# Patient Record
Sex: Female | Born: 1980 | Hispanic: No | Marital: Married | Smoking: Never smoker
Health system: Southern US, Community
[De-identification: ages and names within clinical notes are randomized; demographics above are authoritative.]

## PROBLEM LIST (undated history)

## (undated) ENCOUNTER — Inpatient Hospital Stay (HOSPITAL_COMMUNITY): Payer: Self-pay

## (undated) DIAGNOSIS — L509 Urticaria, unspecified: Secondary | ICD-10-CM

## (undated) DIAGNOSIS — K219 Gastro-esophageal reflux disease without esophagitis: Secondary | ICD-10-CM

## (undated) DIAGNOSIS — R002 Palpitations: Secondary | ICD-10-CM

## (undated) DIAGNOSIS — L309 Dermatitis, unspecified: Secondary | ICD-10-CM

## (undated) DIAGNOSIS — M542 Cervicalgia: Secondary | ICD-10-CM

## (undated) DIAGNOSIS — T7840XA Allergy, unspecified, initial encounter: Secondary | ICD-10-CM

## (undated) HISTORY — DX: Urticaria, unspecified: L50.9

## (undated) HISTORY — DX: Cervicalgia: M54.2

## (undated) HISTORY — DX: Palpitations: R00.2

## (undated) HISTORY — DX: Allergy, unspecified, initial encounter: T78.40XA

## (undated) HISTORY — DX: Gastro-esophageal reflux disease without esophagitis: K21.9

## (undated) HISTORY — PX: NO PAST SURGERIES: SHX2092

## (undated) HISTORY — DX: Dermatitis, unspecified: L30.9

---

## 2009-07-21 ENCOUNTER — Ambulatory Visit: Payer: Self-pay | Admitting: Family Medicine

## 2009-10-07 ENCOUNTER — Emergency Department (HOSPITAL_COMMUNITY): Admission: EM | Admit: 2009-10-07 | Discharge: 2009-10-07 | Payer: Self-pay | Admitting: Emergency Medicine

## 2010-01-08 ENCOUNTER — Ambulatory Visit: Payer: Self-pay | Admitting: Family Medicine

## 2010-01-17 ENCOUNTER — Ambulatory Visit (HOSPITAL_COMMUNITY): Admission: RE | Admit: 2010-01-17 | Discharge: 2010-01-17 | Payer: Self-pay | Admitting: Family Medicine

## 2010-03-22 ENCOUNTER — Ambulatory Visit: Payer: Self-pay | Admitting: Obstetrics and Gynecology

## 2010-06-05 ENCOUNTER — Ambulatory Visit: Payer: Self-pay | Admitting: Family Medicine

## 2011-01-14 ENCOUNTER — Other Ambulatory Visit (HOSPITAL_COMMUNITY): Payer: Self-pay | Admitting: Family Medicine

## 2011-01-14 DIAGNOSIS — R519 Headache, unspecified: Secondary | ICD-10-CM

## 2011-01-16 ENCOUNTER — Other Ambulatory Visit (HOSPITAL_COMMUNITY): Payer: Self-pay | Admitting: Family Medicine

## 2011-01-16 ENCOUNTER — Ambulatory Visit (HOSPITAL_COMMUNITY)
Admission: RE | Admit: 2011-01-16 | Discharge: 2011-01-16 | Disposition: A | Discharge status: Home / Self Care | Payer: Self-pay | Source: Ambulatory Visit | Attending: Family Medicine | Admitting: Family Medicine

## 2011-01-16 DIAGNOSIS — R51 Headache: Secondary | ICD-10-CM | POA: Insufficient documentation

## 2011-01-16 DIAGNOSIS — R519 Headache, unspecified: Secondary | ICD-10-CM

## 2011-03-14 ENCOUNTER — Encounter: Payer: Self-pay | Admitting: Family Medicine

## 2011-04-08 ENCOUNTER — Emergency Department (HOSPITAL_COMMUNITY)
Admission: EM | Admit: 2011-04-08 | Discharge: 2011-04-09 | Disposition: A | Discharge status: Home / Self Care | Payer: Self-pay | Attending: Emergency Medicine | Admitting: Emergency Medicine

## 2011-04-08 DIAGNOSIS — S335XXA Sprain of ligaments of lumbar spine, initial encounter: Secondary | ICD-10-CM | POA: Insufficient documentation

## 2011-04-08 DIAGNOSIS — M549 Dorsalgia, unspecified: Secondary | ICD-10-CM | POA: Insufficient documentation

## 2011-04-08 DIAGNOSIS — M79609 Pain in unspecified limb: Secondary | ICD-10-CM | POA: Insufficient documentation

## 2011-04-08 DIAGNOSIS — M5430 Sciatica, unspecified side: Secondary | ICD-10-CM | POA: Insufficient documentation

## 2011-04-08 DIAGNOSIS — X58XXXA Exposure to other specified factors, initial encounter: Secondary | ICD-10-CM | POA: Insufficient documentation

## 2011-04-08 DIAGNOSIS — N390 Urinary tract infection, site not specified: Secondary | ICD-10-CM | POA: Insufficient documentation

## 2011-04-09 LAB — URINE MICROSCOPIC-ADD ON

## 2011-04-09 LAB — URINALYSIS, ROUTINE W REFLEX MICROSCOPIC: Specific Gravity, Urine: 1.007 (ref 1.005–1.030)

## 2012-01-28 ENCOUNTER — Telehealth: Payer: Self-pay | Admitting: Family Medicine

## 2012-01-28 NOTE — Telephone Encounter (Signed)
MEFLOQUINE IS WEEKLY DOSAGE.  AND NEEDS TO START 1 WEEK BEFORE & CONTINUE FOR 4 WEEKS AFTER RETURNS.  THEY ARE STAYING APPROX 3 MONTHS.  SO WOULD NEED 17 WEEKS SUPPLY FOR EACH PATIENT.

## 2012-02-03 ENCOUNTER — Institutional Professional Consult (permissible substitution): Payer: Self-pay | Admitting: Family Medicine

## 2012-02-04 ENCOUNTER — Telehealth: Payer: Self-pay | Admitting: Family Medicine

## 2012-02-04 MED ORDER — MEFLOQUINE HCL 250 MG PO TABS: 250.0000 mg | ORAL_TABLET | ORAL | Status: AC

## 2012-02-04 NOTE — Telephone Encounter (Signed)
Vernona Rieger called med into [facility]

## 2012-10-07 ENCOUNTER — Encounter (HOSPITAL_COMMUNITY): Payer: Self-pay | Admitting: *Deleted

## 2012-10-07 ENCOUNTER — Inpatient Hospital Stay (HOSPITAL_COMMUNITY)
Admission: AD | Admit: 2012-10-07 | Discharge: 2012-10-07 | Disposition: A | Discharge status: Home / Self Care | Payer: 59 | Source: Ambulatory Visit | Attending: Obstetrics and Gynecology | Admitting: Obstetrics and Gynecology

## 2012-10-07 DIAGNOSIS — R109 Unspecified abdominal pain: Secondary | ICD-10-CM | POA: Insufficient documentation

## 2012-10-07 DIAGNOSIS — Z30431 Encounter for routine checking of intrauterine contraceptive device: Secondary | ICD-10-CM | POA: Insufficient documentation

## 2012-10-07 DIAGNOSIS — N390 Urinary tract infection, site not specified: Secondary | ICD-10-CM | POA: Insufficient documentation

## 2012-10-07 LAB — POCT PREGNANCY, URINE: Preg Test, Ur: NEGATIVE

## 2012-10-07 LAB — URINALYSIS, ROUTINE W REFLEX MICROSCOPIC
Glucose, UA: NEGATIVE mg/dL
Ketones, ur: NEGATIVE mg/dL
Protein, ur: NEGATIVE mg/dL
pH: 6 (ref 5.0–8.0)

## 2012-10-07 LAB — URINE MICROSCOPIC-ADD ON

## 2012-10-07 MED ORDER — SULFAMETHOXAZOLE-TRIMETHOPRIM 800-160 MG PO TABS: 1.0000 | ORAL_TABLET | Freq: Two times a day (BID) | ORAL | Status: AC

## 2012-10-07 NOTE — MAU Note (Addendum)
Has IUD, has had for 5 yrs (placed 02/09  By MD in [place]))- is time to be removed.  Pain started about 2 wks ago.    Feels bloated

## 2012-10-07 NOTE — MAU Provider Note (Signed)
History     CSN: [id number]  Arrival date and time: 10/07/12 1314   First Provider Initiated Contact with Patient 10/07/12 1604      No chief complaint on file.  HPI Denise Arias 32 y.o.  Comes to MAU as she has been having lower abdominal pain x 2 weeks.  She feels bloated and thinks it is due to her IUD which has been in for 5 years and is now expired.  Wants her IUD removed due to the bloating.  OB History    Grav Para Term Preterm Abortions TAB SAB Ect Mult Living   4 4 4       4       Past Medical History  Diagnosis Date  . Allergy     RHINITIS  . GERD (gastroesophageal reflux disease)     Past Surgical History  Procedure Date  . Vaginal delivery     X 4    History reviewed. No pertinent family history.  History  Substance Use Topics  . Smoking status: Never Smoker   . Smokeless tobacco: Never Used  . Alcohol Use: No    Allergies:  Allergies  Allergen Reactions  . Banana   . Fish Oil     rash  . Strawberry     rash    Prescriptions prior to admission  Medication Sig Dispense Refill  . amoxicillin (AMOXIL) 500 MG capsule Take 500 mg by mouth 2 (two) times daily. Patient is close to finishing course 10-07-12,called pharmacy and they did not have it on file      . ibuprofen (ADVIL,MOTRIN) 200 MG tablet Take 200 mg by mouth every 6 (six) hours as needed. pain        Review of Systems  Constitutional: Negative for fever.  Gastrointestinal: Positive for abdominal pain. Negative for nausea and vomiting.       Lower abdominal bloating  Genitourinary: Negative for dysuria.   Physical Exam   Blood pressure 126/73, pulse 62, temperature 98 F (36.7 C), temperature source Oral, resp. rate 18, height 5' 4.5" (1.638 m), weight 80.74 kg (178 lb), last menstrual period 09/28/2012.  Physical Exam  Nursing note and vitals reviewed. Constitutional: She is oriented to person, place, and time. She appears well-developed and well-nourished.  HENT:  Head:  Normocephalic.  Eyes: EOM are normal.  Neck: Neck supple.  GI: Soft. There is tenderness. There is no rebound and no guarding.       Diffuse lower abdominal tenderness.  L>R  No visual difference seen in abdomen from left to right side.  Genitourinary:       Declines pelvic  Musculoskeletal: Normal range of motion.  Neurological: She is alert and oriented to person, place, and time.  Skin: Skin is warm and dry.  Psychiatric: She has a normal mood and affect.    MAU Course  Procedures  MDM Discussed at length with client that pain may not be related to IUD.  Advised to leave IUD in even if expired as she may still have some contraceptive benefit with an expired IUD over no IUD.  Advised to have UTI treated first and pain may resolve.  Additionally noted after client left that she has been taking Amoxicillin for a sore throat which may also be causing some lower abdominal pain as her pain is worse on the left side. 1622  Client standing at the door dressed.  Declines any vaginal exam.  Wants to go if IUD is not going to be  removed today.  Desires an appointment in the clinic.  Will treat UTI today.  Assessment and Plan  UTI Lower abdominal pain Expired IUD  Plan rx septra DS one PO bid x 3 days (#6) no refills Message sent to GYN clinic to schedule an appointment. Client left distressed over the amount of time it has taken today to be seen.  BURLESON,TERRI 10/07/2012, 4:17 PM

## 2012-10-08 ENCOUNTER — Encounter: Payer: Self-pay | Admitting: Medical

## 2012-10-08 LAB — URINE CULTURE: Colony Count: NO GROWTH

## 2012-10-08 NOTE — MAU Provider Note (Signed)
Attestation of Attending Supervision of Advanced Practitioner (CNM/NP): Evaluation and management procedures were performed by the Advanced Practitioner under my supervision and collaboration.  I have reviewed the Advanced Practitioner's note and chart, and I agree with the management and plan.  Zhoe Catania 10/08/2012 10:21 AM

## 2012-10-23 ENCOUNTER — Other Ambulatory Visit: Payer: Self-pay | Admitting: Medical

## 2012-10-23 ENCOUNTER — Encounter: Payer: Self-pay | Admitting: Medical

## 2012-10-23 ENCOUNTER — Ambulatory Visit (INDEPENDENT_AMBULATORY_CARE_PROVIDER_SITE_OTHER): Payer: Managed Care, Other (non HMO) | Admitting: Medical

## 2012-10-23 VITALS — BP 110/76 | HR 69 | Temp 97.8°F | Ht 64.0 in | Wt 178.0 lb

## 2012-10-23 DIAGNOSIS — Z975 Presence of (intrauterine) contraceptive device: Secondary | ICD-10-CM

## 2012-10-23 DIAGNOSIS — Z30433 Encounter for removal and reinsertion of intrauterine contraceptive device: Secondary | ICD-10-CM

## 2012-10-23 LAB — POCT PREGNANCY, URINE: Preg Test, Ur: NEGATIVE

## 2012-10-23 MED ORDER — LEVONORGESTREL 20 MCG/24HR IU IUD
INTRAUTERINE_SYSTEM | Freq: Once | INTRAUTERINE | Status: AC
  Administered 2012-10-23: 1 via INTRAUTERINE

## 2012-10-23 NOTE — Patient Instructions (Addendum)
Intrauterine Device Insertion Care After Refer to this sheet in the next few weeks. These instructions provide you with information on caring for yourself after your procedure. Your caregiver may also give you more specific instructions. Your treatment has been planned according to current medical practices, but problems sometimes occur. Call your caregiver if you have any problems or questions after your procedure. HOME CARE INSTRUCTIONS   Only take over-the-counter or prescription medicines for pain, discomfort, or fever as directed by your caregiver. Do not use aspirin. This may increase bleeding.  Check your IUD to make sure it is in place before you resume sexual activity. You should be able to feel the strings. If you cannot feel the strings, something may be wrong. The IUD may have fallen out of the uterus, or the uterus may have been punctured (perforated) during placement. Also, if the strings are getting longer, it may mean that the IUD is being forced out of the uterus. You no longer have full protection from pregnancy if any of these problems occur.  You may resume sexual intercourse if you are not having problems with the IUD. The IUD is considered immediately effective.  You may resume normal activities.  Keep all follow-up appointments to be sure your IUD has remained in place. After the first exam, yearly exams are advised, unless you cannot feel the strings of your IUD.  Continue to check that the IUD is still in place by feeling for the strings after every menstrual period. SEEK MEDICAL CARE IF:   You have bleeding that is heavier or lasts longer than a normal menstrual cycle.  You have a fever.  You have increasing cramps or abdominal pain not relieved with medicine.  You have abdominal pain that does not seem to be related to the same area of earlier cramping and pain.  You are lightheaded, unusually weak, or faint.  You have abnormal vaginal discharge or  smells.  You have pain during sexual intercourse.  You cannot feel the IUD strings, or the IUD string has gotten longer.  You feel the IUD at the opening of the cervix in the vagina.  You think you are pregnant, or you miss your menstrual period.  The IUD string is hurting your sex partner. Document Released: 04/17/2011 Document Revised: 11/11/2011 Document Reviewed: 04/17/2011 ExitCare Patient Information 2013 [employer].  

## 2012-10-23 NOTE — Progress Notes (Signed)
Subjective:     Patient ID: Denise Arias, female   DOB: May 12, 1981, 32 y.o.   MRN: [id number]  HPI Denise Arias is a 32 yo G4P4 female who presents to the clinic requesting IUD removal and insertion. She reports that her IUD was inserted in [place] 5 years ago, and expires this month. She reports that the IUD is not Mirena. She reports that she was seen in the MAU at the beginning of February for abdominal pain, which she thought was associated with her IUD. She stated that she has had mild abdominal cramping over the last year. She was found to have a UTI and was treated, and decided to leave the IUD in until she had an appointment here for insertion. She reports that she enjoyed having the IUD but that her periods continued to be very heavy. She denies any abnormal vaginal discharge.  She reports that she has had a Pap smear within the last two years by [facility] and that the Pap results were normal.   Review of Systems  Constitutional: Negative for fever.  Gastrointestinal: Negative for abdominal pain.  Genitourinary: Negative for dysuria and frequency.       Objective:   Physical Exam General appearance - alert, well appearing, and in no distress and oriented to person, place, and time Chest - clear to auscultation, no wheezes, rales or rhonchi, symmetric air entry Heart - normal rate, regular rhythm, normal S1, S2, no murmurs, rubs, clicks or gallops Abdomen - soft, nontender, nondistended, no masses or organomegaly Pelvic - normal external genitalia, vulva, vagina, cervix, uterus and adnexa  Extremities - peripheral pulses normal, no pedal edema, no clubbing or cyanosis  IUD Removal  Patient was in the dorsal lithotomy position, normal external genitalia was noted.  A speculum was placed in the patient's vagina, normal discharge was noted, no lesions. The multiparous cervix was visualized, no lesions, no abnormal discharge; The strings of the IUD were grasped  and pulled using ring forceps.  The IUD was successfully removed in its entirety. Patient tolerated the procedure well.    IUD Procedure Note Patient identified, informed consent performed.  Discussed risks of irregular bleeding, cramping, infection, malpositioning or misplacement of the IUD outside the uterus which may require further procedures. Time out was performed.  Urine pregnancy test negative.  Speculum placed in the vagina.  Cervix visualized.  Cleaned with Betadine x 2.  Grasped anteriorly with a single tooth tenaculum.  Uterus sounded to 7 cm.  Mirena IUD placed per manufacturer's recommendations.  Strings trimmed to 3 cm. Tenaculum was removed, good hemostasis noted.  Patient tolerated procedure well.   Patient was given post-procedure instructions.  Patient was also asked to check IUD strings periodically and follow up in 4 weeks for IUD check.     Assessment:     Denise Arias is a 32 yo female who present for IUD removal and insertion. Negative pregnancy test today.  IUD removed Mirena IUD inserted    Plan:     Follow-up for IUD string check in 4 weeks or sooner if necessary Discussed potential for increased vaginal bleeding and cramping today. Advised to take ibuprofen for discomfort today.  I have seen and evaluated this patient with the PA student. I have edited the above not to reflect my observations and exam/procedures. I agree with the assessment and plan as written above.

## 2012-11-19 ENCOUNTER — Ambulatory Visit (INDEPENDENT_AMBULATORY_CARE_PROVIDER_SITE_OTHER): Payer: 59 | Admitting: Obstetrics & Gynecology

## 2012-11-19 ENCOUNTER — Encounter: Payer: Self-pay | Admitting: Obstetrics & Gynecology

## 2012-11-19 VITALS — BP 122/74 | HR 73 | Temp 97.0°F | Ht 64.0 in | Wt 180.9 lb

## 2012-11-19 DIAGNOSIS — Z30431 Encounter for routine checking of intrauterine contraceptive device: Secondary | ICD-10-CM

## 2012-11-19 DIAGNOSIS — Z975 Presence of (intrauterine) contraceptive device: Secondary | ICD-10-CM

## 2012-11-19 NOTE — Progress Notes (Signed)
Patient wants to know if she can have vitamins because she has mirena  And has had some irregular bleeding.

## 2012-11-19 NOTE — Assessment & Plan Note (Signed)
IUD strings checked and IUD in place. Follow [place] as needed.

## 2012-11-19 NOTE — Patient Instructions (Addendum)
For the vitamins you can take: women's multivitamin or prenatal vitamins.  Levonorgestrel intrauterine device (IUD) What is this medicine? LEVONORGESTREL IUD (LEE voe nor jes trel) is a contraceptive (birth control) device. It is used to prevent pregnancy and to treat heavy bleeding that occurs during your period. It can be used for up to 5 years. This medicine may be used for other purposes; ask your health care provider or pharmacist if you have questions. What should I tell my health care provider before I take this medicine? They need to know if you have any of these conditions: -abnormal Pap smear -cancer of the breast, uterus, or cervix -diabetes -endometritis -genital or pelvic infection now or in the past -have more than one sexual partner or your partner has more than one partner -heart disease -history of an ectopic or tubal pregnancy -immune system problems -IUD in place -liver disease or tumor -problems with blood clots or take blood-thinners -use intravenous drugs -uterus of unusual shape -vaginal bleeding that has not been explained -an unusual or allergic reaction to levonorgestrel, other hormones, silicone, or polyethylene, medicines, foods, dyes, or preservatives -pregnant or trying to get pregnant -breast-feeding How should I use this medicine? This device is placed inside the uterus by a health care professional. Talk to your pediatrician regarding the use of this medicine in children. Special care may be needed. Overdosage: If you think you have taken too much of this medicine contact a poison control center or emergency room at once. NOTE: This medicine is only for you. Do not share this medicine with others. What if I miss a dose? This does not apply. What may interact with this medicine? Do not take this medicine with any of the following medications: -amprenavir -bosentan -fosamprenavir This medicine may also interact with the following  medications: -aprepitant -barbiturate medicines for inducing sleep or treating seizures -bexarotene -griseofulvin -medicines to treat seizures like carbamazepine, ethotoin, felbamate, oxcarbazepine, phenytoin, topiramate -modafinil -pioglitazone -rifabutin -rifampin -rifapentine -some medicines to treat HIV infection like atazanavir, indinavir, lopinavir, nelfinavir, tipranavir, ritonavir -St. John's wort -warfarin This list may not describe all possible interactions. Give your health care provider a list of all the medicines, herbs, non-prescription drugs, or dietary supplements you use. Also tell them if you smoke, drink alcohol, or use illegal drugs. Some items may interact with your medicine. What should I watch for while using this medicine? Visit your doctor or health care professional for regular check ups. See your doctor if you or your partner has sexual contact with others, becomes HIV positive, or gets a sexual transmitted disease. This product does not protect you against HIV infection (AIDS) or other sexually transmitted diseases. You can check the placement of the IUD yourself by reaching up to the top of your vagina with clean fingers to feel the threads. Do not pull on the threads. It is a good habit to check placement after each menstrual period. Call your doctor right away if you feel more of the IUD than just the threads or if you cannot feel the threads at all. The IUD may come out by itself. You may become pregnant if the device comes out. If you notice that the IUD has come out use a backup birth control method like condoms and call your health care provider. Using tampons will not change the position of the IUD and are okay to use during your period. What side effects may I notice from receiving this medicine? Side effects that you should report to  your doctor or health care professional as soon as possible: -allergic reactions like skin rash, itching or hives, swelling  of the face, lips, or tongue -fever, flu-like symptoms -genital sores -high blood pressure -no menstrual period for 6 weeks during use -pain, swelling, warmth in the leg -pelvic pain or tenderness -severe or sudden headache -signs of pregnancy -stomach cramping -sudden shortness of breath -trouble with balance, talking, or walking -unusual vaginal bleeding, discharge -yellowing of the eyes or skin Side effects that usually do not require medical attention (report to your doctor or health care professional if they continue or are bothersome): -acne -breast pain -change in sex drive or performance -changes in weight -cramping, dizziness, or faintness while the device is being inserted -headache -irregular menstrual bleeding within first 3 to 6 months of use -nausea This list may not describe all possible side effects. Call your doctor for medical advice about side effects. You may report side effects to FDA at 1-800-FDA-1088. Where should I keep my medicine? This does not apply. NOTE: This sheet is a summary. It may not cover all possible information. If you have questions about this medicine, talk to your doctor, pharmacist, or health care provider.  2012, Elsevier/Gold Standard. (09/09/2008 6:39:08 PM)

## 2012-11-19 NOTE — Progress Notes (Signed)
Patient ID: Leela Vanbrocklin    DOB: 06-10-1981, 32 y.o.   MRN: [id number] --- Subjective:  Yeraldi is a 32 y.o.female who presents for IUD string check. Reports some spotting, otherwise no complaints.   ROS: see HPI Past Medical History: reviewed and updated medications and allergies. Social History: Tobacco: none  Objective: Filed Vitals:   11/19/12 1332  BP: 122/74  Pulse: 73  Temp: 97 F (36.1 C)    Physical Examination:   General appearance - alert, well appearing, and in no distress Abdomen - soft, non tender, non distended Pelvic exam: normal external genitalia, vulva, vagina, cervix with visualized IUD strings at ~2cm from os.    Attestation of Attending Supervision of Resident: Evaluation and management procedures were performed by the [facility] Medicine Resident under my supervision.  I have seen and examined the patient, reviewed the resident's note and chart, and I agree with the management and plan.  Anibal Henderson, M.D. 11/19/2012 2:30 PM

## 2012-12-16 ENCOUNTER — Ambulatory Visit: Payer: 59 | Admitting: Obstetrics & Gynecology

## 2013-03-12 ENCOUNTER — Encounter (HOSPITAL_BASED_OUTPATIENT_CLINIC_OR_DEPARTMENT_OTHER): Payer: Self-pay | Admitting: *Deleted

## 2013-03-12 ENCOUNTER — Emergency Department (HOSPITAL_BASED_OUTPATIENT_CLINIC_OR_DEPARTMENT_OTHER)
Admission: EM | Admit: 2013-03-12 | Discharge: 2013-03-12 | Disposition: A | Discharge status: Home / Self Care | Payer: Worker's Compensation | Attending: Emergency Medicine | Admitting: Emergency Medicine

## 2013-03-12 ENCOUNTER — Emergency Department (HOSPITAL_BASED_OUTPATIENT_CLINIC_OR_DEPARTMENT_OTHER): Payer: Worker's Compensation

## 2013-03-12 DIAGNOSIS — Y9289 Other specified places as the place of occurrence of the external cause: Secondary | ICD-10-CM | POA: Insufficient documentation

## 2013-03-12 DIAGNOSIS — S61209A Unspecified open wound of unspecified finger without damage to nail, initial encounter: Secondary | ICD-10-CM | POA: Insufficient documentation

## 2013-03-12 DIAGNOSIS — Z8719 Personal history of other diseases of the digestive system: Secondary | ICD-10-CM | POA: Insufficient documentation

## 2013-03-12 DIAGNOSIS — Y9389 Activity, other specified: Secondary | ICD-10-CM | POA: Insufficient documentation

## 2013-03-12 DIAGNOSIS — W268XXA Contact with other sharp object(s), not elsewhere classified, initial encounter: Secondary | ICD-10-CM | POA: Insufficient documentation

## 2013-03-12 DIAGNOSIS — S61218A Laceration without foreign body of other finger without damage to nail, initial encounter: Secondary | ICD-10-CM

## 2013-03-12 MED ORDER — HYDROCODONE-ACETAMINOPHEN 5-325 MG PO TABS: 2.0000 | ORAL_TABLET | ORAL | Status: DC | PRN

## 2013-03-12 NOTE — ED Notes (Signed)
Pt was not able to obtain enough urine to do urine drug screen upon arrival.  Has been waiting to be able to complete test.  Will try again prior to leaving ED.  Pt will see hand surgeon in the morning for tendon repair.  Lac not closed at this time pending eval by surgeon. Bulky gauze dressing and splint applied.

## 2013-03-12 NOTE — ED Notes (Signed)
Used kling and coban to wrap rt index finger

## 2013-03-12 NOTE — ED Provider Notes (Signed)
History    CSN: [id number] Arrival date & time 03/12/13  0036  First MD Initiated Contact with Patient 03/12/13 0136     Chief Complaint  Patient presents with  . Extremity Laceration   (Consider location/radiation/quality/duration/timing/severity/associated sxs/prior Treatment) HPI Comments: Patient lacerated her finger on a sharp object this evening at work.  She is having severe pain and is unable to extend the finger.  There was "something white" sticking out of the laceration shortly after it occurred.  She is having severe pain.  The history is provided by the patient.   Past Medical History  Diagnosis Date  . Allergy     RHINITIS  . GERD (gastroesophageal reflux disease)    Past Surgical History  Procedure Laterality Date  . Vaginal delivery      X 4   Family History  Problem Relation Age of Onset  . Diabetes Mother    History  Substance Use Topics  . Smoking status: Never Smoker   . Smokeless tobacco: Never Used  . Alcohol Use: No   OB History   Grav Para Term Preterm Abortions TAB SAB Ect Mult Living   4 4 4       4      Review of Systems  All other systems reviewed and are negative.    Allergies  Banana; Fish oil; and Strawberry  Home Medications   Current Outpatient Rx  Name  Route  Sig  Dispense  Refill  . ibuprofen (ADVIL,MOTRIN) 200 MG tablet   Oral   Take 200 mg by mouth every 6 (six) hours as needed. pain         . levonorgestrel (MIRENA) 20 MCG/24HR IUD   Intrauterine   1 each by Intrauterine route once.          BP 106/75  Temp(Src) 98.7 F (37.1 C) (Oral)  Resp 16  Ht 5\' 4"  (1.626 m)  Wt 170 lb (77.111 kg)  BMI 29.17 kg/m2  SpO2 100% Physical Exam  Nursing note and vitals reviewed. Constitutional: She is oriented to person, place, and time. She appears well-developed and well-nourished. No distress.  HENT:  Head: Normocephalic and atraumatic.  Mouth/Throat: Oropharynx is clear and moist.  Neck: Normal range of  motion. Neck supple.  Musculoskeletal: Normal range of motion. She exhibits no edema.  There is a 1 cm laceration overlying the dorsal aspect of the DIP joint of the left index finger.  She is unable to extend the distal phalanx.    Neurological: She is alert and oriented to person, place, and time.  Skin: Skin is warm and dry. She is not diaphoretic.    ED Course  Procedures (including critical care time) Labs Reviewed - No data to display Dg Finger Index Left  03/12/2013   *RADIOLOGY REPORT*  Clinical Data: Laceration and pain to the distal left index finger.  LEFT INDEX FINGER 2+V  Comparison: None.  Findings: Soft tissue defect along the distal aspect of the left second finger consistent with history of laceration.  The distal interphalangeal joint is in flexion, suggesting possible ligamentous injury.  No acute fracture or subluxation is suggested. No focal bone lesion or bone destruction.  No radiopaque soft tissue foreign bodies.  IMPRESSION: Soft tissue laceration about the distal aspect of the left second finger.  Flexion deformity of the distal interphalangeal joint may suggest ligamentous injury.  No acute fractures.   Original Report Authenticated By: Burman Nieves, M.D.   No diagnosis found.  MDM  The exam and xrays are consistent with a laceration to the extensor tendon of the left index finger.  She will be irrigated and placed in a bulky dressing.  She will given the contact information for Dr. Amanda Pea who is on for hand surgery tonight.  She needs to see him tomorrow to arrange repair.    Geoffery Lyons, MD 03/12/13 [id number]

## 2013-03-12 NOTE — ED Notes (Signed)
Pt c/o laceration to left index finger while at work 1 hr ago

## 2013-05-27 ENCOUNTER — Encounter: Payer: Self-pay | Admitting: Internal Medicine

## 2013-05-27 ENCOUNTER — Ambulatory Visit (INDEPENDENT_AMBULATORY_CARE_PROVIDER_SITE_OTHER): Payer: BC Managed Care – PPO | Admitting: Internal Medicine

## 2013-05-27 VITALS — BP 100/60 | HR 61 | Temp 98.6°F | Ht 64.0 in | Wt 186.5 lb

## 2013-05-27 DIAGNOSIS — K219 Gastro-esophageal reflux disease without esophagitis: Secondary | ICD-10-CM | POA: Insufficient documentation

## 2013-05-27 DIAGNOSIS — Z23 Encounter for immunization: Secondary | ICD-10-CM

## 2013-05-27 MED ORDER — PANTOPRAZOLE SODIUM 40 MG PO TBEC: 40.0000 mg | DELAYED_RELEASE_TABLET | Freq: Every day | ORAL | Status: DC

## 2013-05-27 NOTE — Assessment & Plan Note (Signed)
Will start protonix 40 mg daily Handout provided for diet for GERD  RTC if pain worsens or symptoms persist for  > 2 weeks

## 2013-05-27 NOTE — Progress Notes (Signed)
Subjective:    Patient ID: Denise Arias, female    DOB: 28-Jun-1981, 32 y.o.   MRN: [id number]  HPI  Pt presents to the clinic today for upper abdominal pain. This started 3 days ago. She has this pain intermittently. It seems to be worse after eating certain foods. She has had some associated nausea but denies vomiting. She is having normal bowel movements. She denies dark or tarry stools. She is unsure if she has reflux. She has not tried anything OTC.  Review of Systems      Past Medical History  Diagnosis Date  . Allergy     RHINITIS  . GERD (gastroesophageal reflux disease)     Current Outpatient Prescriptions  Medication Sig Dispense Refill  . HYDROcodone-acetaminophen (NORCO) 5-325 MG per tablet Take 2 tablets by mouth every 4 (four) hours as needed for pain.  20 tablet  0  . ibuprofen (ADVIL,MOTRIN) 200 MG tablet Take 200 mg by mouth every 6 (six) hours as needed. pain      . levonorgestrel (MIRENA) 20 MCG/24HR IUD 1 each by Intrauterine route once.       No current facility-administered medications for this visit.    Allergies  Allergen Reactions  . Banana   . Fish Oil     rash  . Strawberry     rash    Family History  Problem Relation Age of Onset  . Diabetes Mother     History   Social History  . Marital Status: Married    Spouse Name: N/A    Number of Children: N/A  . Years of Education: N/A   Occupational History  . Not on file.   Social History Main Topics  . Smoking status: Never Smoker   . Smokeless tobacco: Never Used  . Alcohol Use: No  . Drug Use: No  . Sexual Activity: Yes    Birth Control/ Protection: IUD   Other Topics Concern  . Not on file   Social History Narrative  . No narrative on file     Constitutional: Denies fever, malaise, fatigue, headache or abrupt weight changes.  Respiratory: Denies difficulty breathing, shortness of breath, cough or sputum production.   Cardiovascular: Denies chest pain, chest  tightness, palpitations or swelling in the hands or feet.  Gastrointestinal: Pt reports abdominal pain. Denies  bloating, constipation, diarrhea or blood in the stool.  GU: Denies urgency, frequency, pain with urination, burning sensation, blood in urine, odor or discharge.    No other specific complaints in a complete review of systems (except as listed in HPI above).  Objective:   Physical Exam   BP 100/60  Pulse 61  Temp(Src) 98.6 F (37 C) (Oral)  Ht 5\' 4"  (1.626 m)  Wt 186 lb 8 oz (84.596 kg)  BMI 32 kg/m2  SpO2 98% Wt Readings from Last 3 Encounters:  05/27/13 186 lb 8 oz (84.596 kg)  03/12/13 170 lb (77.111 kg)  11/19/12 180 lb 14.4 oz (82.056 kg)    General: Appears her stated age, well developed, well nourished in NAD. Cardiovascular: Normal rate and rhythm. S1,S2 noted.  No murmur, rubs or gallops noted. No JVD or BLE edema. No carotid bruits noted. Pulmonary/Chest: Normal effort and positive vesicular breath sounds. No respiratory distress. No wheezes, rales or ronchi noted.  Abdomen: Soft and tender in the epigastric area. Normal bowel sounds, no bruits noted. No distention or masses noted. Liver, spleen and kidneys non palpable.      Assessment &  Plan:

## 2013-05-27 NOTE — Patient Instructions (Signed)
Gastroesophageal Reflux Disease, Adult  Gastroesophageal reflux disease (GERD) happens when acid from your stomach flows up into the esophagus. When acid comes in contact with the esophagus, the acid causes soreness (inflammation) in the esophagus. Over time, GERD may create small holes (ulcers) in the lining of the esophagus.  CAUSES   · Increased body weight. This puts pressure on the stomach, making acid rise from the stomach into the esophagus.  · Smoking. This increases acid production in the stomach.  · Drinking alcohol. This causes decreased pressure in the lower esophageal sphincter (valve or ring of muscle between the esophagus and stomach), allowing acid from the stomach into the esophagus.  · Late evening meals and a full stomach. This increases pressure and acid production in the stomach.  · A malformed lower esophageal sphincter.  Sometimes, no cause is found.  SYMPTOMS   · Burning pain in the lower part of the mid-chest behind the breastbone and in the mid-stomach area. This may occur twice a week or more often.  · Trouble swallowing.  · Sore throat.  · Dry cough.  · Asthma-like symptoms including chest tightness, shortness of breath, or wheezing.  DIAGNOSIS   Your caregiver may be able to diagnose GERD based on your symptoms. In some cases, X-rays and other tests may be done to check for complications or to check the condition of your stomach and esophagus.  TREATMENT   Your caregiver may recommend over-the-counter or prescription medicines to help decrease acid production. Ask your caregiver before starting or adding any new medicines.   HOME CARE INSTRUCTIONS   · Change the factors that you can control. Ask your caregiver for guidance concerning weight loss, quitting smoking, and alcohol consumption.  · Avoid foods and drinks that make your symptoms worse, such as:  · Caffeine or alcoholic drinks.  · Chocolate.  · Peppermint or mint flavorings.  · Garlic and onions.  · Spicy foods.  · Citrus fruits,  such as oranges, lemons, or limes.  · Tomato-based foods such as sauce, chili, salsa, and pizza.  · Fried and fatty foods.  · Avoid lying down for the 3 hours prior to your bedtime or prior to taking a nap.  · Eat small, frequent meals instead of large meals.  · Wear loose-fitting clothing. Do not wear anything tight around your waist that causes pressure on your stomach.  · Raise the head of your bed 6 to 8 inches with wood blocks to help you sleep. Extra pillows will not help.  · Only take over-the-counter or prescription medicines for pain, discomfort, or fever as directed by your caregiver.  · Do not take aspirin, ibuprofen, or other nonsteroidal anti-inflammatory drugs (NSAIDs).  SEEK IMMEDIATE MEDICAL CARE IF:   · You have pain in your arms, neck, jaw, teeth, or back.  · Your pain increases or changes in intensity or duration.  · You develop nausea, vomiting, or sweating (diaphoresis).  · You develop shortness of breath, or you faint.  · Your vomit is green, yellow, black, or looks like coffee grounds or blood.  · Your stool is red, bloody, or black.  These symptoms could be signs of other problems, such as heart disease, gastric bleeding, or esophageal bleeding.  MAKE SURE YOU:   · Understand these instructions.  · Will watch your condition.  · Will get help right away if you are not doing well or get worse.  Document Released: 05/29/2005 Document Revised: 11/11/2011 Document Reviewed: 03/08/2011  [employer]® Patient   Information ©2014 [employer].

## 2013-07-23 ENCOUNTER — Encounter: Payer: Self-pay | Admitting: Medical

## 2013-07-23 ENCOUNTER — Ambulatory Visit (INDEPENDENT_AMBULATORY_CARE_PROVIDER_SITE_OTHER): Payer: Medicaid Other | Admitting: Medical

## 2013-07-23 VITALS — BP 140/82 | HR 68 | Temp 98.2°F | Resp 16 | Wt 181.0 lb

## 2013-07-23 DIAGNOSIS — R5381 Other malaise: Secondary | ICD-10-CM

## 2013-07-23 DIAGNOSIS — R239 Unspecified skin changes: Secondary | ICD-10-CM

## 2013-07-23 DIAGNOSIS — R238 Other skin changes: Secondary | ICD-10-CM

## 2013-07-23 LAB — BASIC METABOLIC PANEL
Calcium: 9.1 mg/dL (ref 8.4–10.5)
Chloride: 104 mEq/L (ref 96–112)
Creat: 0.53 mg/dL (ref 0.50–1.10)
Glucose, Bld: 82 mg/dL (ref 70–99)

## 2013-07-23 LAB — T4, FREE: Free T4: 1.01 ng/dL (ref 0.80–1.80)

## 2013-07-23 NOTE — Progress Notes (Signed)
Subjective: Here for skin issues.  For 10 years now she has had darkening of skin that is different than her normal skin.   She saw allergist 2004 that did allergy testing and found no allergies.  She ended up seeing dermatology (possibly Dr. Margo Aye but she is unsure) that put her on a pill and 2 creams with no improvement.  They mentioned her using a shot that would cause her to be sterile but would help the skin.  She declined this.  She was told to see a "hormone" doctor.  She is here for a referral to Dr. Talmage Nap whom she heard about.  She does note some fatigue.  Objective: Gen: wd, wn, nad Skin: areas of subtle lighter skin color of hands left foot, other subtle patches throughout, but no obvious vitiligo or other skin anomalies Neck: supple, nontender, no mass, no thyromegaly, no lymphadenopathy Lungs: clear Heart: RRR, normal s1, s2, no murmur   Assessment: Encounter Diagnoses  Name Primary?  . Other malaise and fatigue Yes  . Skin change     Plan: Labs today, etiology unsure.   consider referral (endocrine, different derm)

## 2013-07-24 LAB — CBC
MCV: 89.6 fL (ref 78.0–100.0)
Platelets: 254 10*3/uL (ref 150–400)
RBC: 4.6 MIL/uL (ref 3.87–5.11)
WBC: 8.8 10*3/uL (ref 4.0–10.5)

## 2013-07-26 ENCOUNTER — Encounter: Payer: Self-pay | Admitting: Family Medicine

## 2013-07-29 ENCOUNTER — Emergency Department (HOSPITAL_COMMUNITY)
Admission: EM | Admit: 2013-07-29 | Discharge: 2013-07-29 | Disposition: A | Discharge status: Home / Self Care | Payer: BC Managed Care – PPO | Attending: Emergency Medicine | Admitting: Emergency Medicine

## 2013-07-29 ENCOUNTER — Encounter (HOSPITAL_COMMUNITY): Payer: Self-pay | Admitting: Emergency Medicine

## 2013-07-29 DIAGNOSIS — R42 Dizziness and giddiness: Secondary | ICD-10-CM | POA: Insufficient documentation

## 2013-07-29 DIAGNOSIS — R519 Headache, unspecified: Secondary | ICD-10-CM

## 2013-07-29 DIAGNOSIS — R5381 Other malaise: Secondary | ICD-10-CM | POA: Insufficient documentation

## 2013-07-29 DIAGNOSIS — Z79899 Other long term (current) drug therapy: Secondary | ICD-10-CM | POA: Insufficient documentation

## 2013-07-29 DIAGNOSIS — K219 Gastro-esophageal reflux disease without esophagitis: Secondary | ICD-10-CM | POA: Insufficient documentation

## 2013-07-29 DIAGNOSIS — Z3202 Encounter for pregnancy test, result negative: Secondary | ICD-10-CM | POA: Insufficient documentation

## 2013-07-29 DIAGNOSIS — R509 Fever, unspecified: Secondary | ICD-10-CM | POA: Insufficient documentation

## 2013-07-29 DIAGNOSIS — R51 Headache: Secondary | ICD-10-CM | POA: Insufficient documentation

## 2013-07-29 LAB — URINALYSIS, ROUTINE W REFLEX MICROSCOPIC
Bilirubin Urine: NEGATIVE
Glucose, UA: NEGATIVE mg/dL
Hgb urine dipstick: NEGATIVE
Ketones, ur: NEGATIVE mg/dL
Nitrite: NEGATIVE
Protein, ur: NEGATIVE mg/dL

## 2013-07-29 LAB — BASIC METABOLIC PANEL
CO2: 24 mEq/L (ref 19–32)
Calcium: 8.9 mg/dL (ref 8.4–10.5)
Creatinine, Ser: 0.47 mg/dL — ABNORMAL LOW (ref 0.50–1.10)
GFR calc Af Amer: >90 mL/min (ref >90)

## 2013-07-29 LAB — CBC WITH DIFFERENTIAL/PLATELET
Basophils Absolute: 0 10*3/uL (ref 0.0–0.1)
Basophils Relative: 0 % (ref 0–1)
Eosinophils Absolute: 0.1 10*3/uL (ref 0.0–0.7)
Eosinophils Relative: 2 % (ref 0–5)
HCT: 37.9 % (ref 36.0–46.0)
Lymphocytes Relative: 22 % (ref 12–46)
MCH: 30.1 pg (ref 26.0–34.0)
MCHC: 34.6 g/dL (ref 30.0–36.0)
MCV: 87.1 fL (ref 78.0–100.0)
Monocytes Absolute: 0.7 10*3/uL (ref 0.1–1.0)
Neutro Abs: 4.5 10*3/uL (ref 1.7–7.7)
RDW: 12.3 % (ref 11.5–15.5)

## 2013-07-29 MED ORDER — HYDROCODONE-ACETAMINOPHEN 5-325 MG PO TABS
1.0000 | ORAL_TABLET | Freq: Once | ORAL | Status: AC
  Administered 2013-07-29: 1 via ORAL
  Filled 2013-07-29: qty 1

## 2013-07-29 MED ORDER — MECLIZINE HCL 25 MG PO TABS: 25.0000 mg | ORAL_TABLET | Freq: Two times a day (BID) | ORAL | Status: DC | PRN

## 2013-07-29 MED ORDER — TRAMADOL HCL 50 MG PO TABS: 50.0000 mg | ORAL_TABLET | Freq: Four times a day (QID) | ORAL | Status: DC | PRN

## 2013-07-29 MED ORDER — MECLIZINE HCL 25 MG PO TABS
25.0000 mg | ORAL_TABLET | Freq: Once | ORAL | Status: AC
  Administered 2013-07-29: 25 mg via ORAL
  Filled 2013-07-29: qty 1

## 2013-07-29 MED ORDER — SODIUM CHLORIDE 0.9 % IV BOLUS (SEPSIS)
1000.0000 mL | Freq: Once | INTRAVENOUS | Status: AC
  Administered 2013-07-29: 1000 mL via INTRAVENOUS

## 2013-07-29 MED ORDER — ONDANSETRON HCL 4 MG/2ML IJ SOLN
4.0000 mg | Freq: Once | INTRAMUSCULAR | Status: AC
  Administered 2013-07-29: 4 mg via INTRAVENOUS
  Filled 2013-07-29: qty 2

## 2013-07-29 MED ORDER — DEXAMETHASONE SODIUM PHOSPHATE 10 MG/ML IJ SOLN
10.0000 mg | Freq: Once | INTRAMUSCULAR | Status: AC
  Administered 2013-07-29: 10 mg via INTRAVENOUS
  Filled 2013-07-29: qty 1

## 2013-07-29 NOTE — ED Notes (Signed)
PA at bedside.

## 2013-07-29 NOTE — ED Notes (Signed)
Pt reports intermittent HA since Monday. Reports dizziness today while cooking and concerned she could have a fever. AO x4. Neuro intact. Hx headaches

## 2013-07-29 NOTE — ED Provider Notes (Signed)
CSN: [id number]     Arrival date & time 07/29/13  1259 History   First MD Initiated Contact with Patient 07/29/13 1310     Chief Complaint  Patient presents with  . Headache  . Fever   (Consider location/radiation/quality/duration/timing/severity/associated sxs/prior Treatment) Patient is a 32 y.o. female presenting with headaches.  Headache Pain location:  L parietal Quality:  Sharp Radiates to:  Does not radiate Severity currently:  5/10 Severity at highest:  7/10 Onset quality:  Gradual Duration:  4 days Timing:  Constant Progression:  Waxing and waning Chronicity:  Recurrent Similar to prior headaches: yes (same headache pain but new symptoms of weakness and dizziness)   Context: not activity, not caffeine, not stress, not intercourse and not straining   Relieved by:  Nothing Worsened by:  Nothing tried Associated symptoms: dizziness and weakness     Denise Arias is a 32 y.o.female with a significant PMH of GERD and allergies presents to the ER with complaints of dizziness and headache since Monday. The headache is left parietal and sharp. She gets a headache the is similar in the past but she has never had weakness and dizziness with it. Pt feels as though her walking is abnormal but her husband has not noticed change in gait. She is concerned that she may have a fever because she has felt hot.   Past Medical History  Diagnosis Date  . Allergy     RHINITIS  . GERD (gastroesophageal reflux disease)    Past Surgical History  Procedure Laterality Date  . Vaginal delivery      X 4   Family History  Problem Relation Age of Onset  . Diabetes Mother    History  Substance Use Topics  . Smoking status: Never Smoker   . Smokeless tobacco: Never Used  . Alcohol Use: No   OB History   Grav Para Term Preterm Abortions TAB SAB Ect Mult Living   4 4 4       4      Review of Systems  Neurological: Positive for dizziness and headaches.    Allergies  Banana;  Fish oil; and Strawberry  Home Medications   Current Outpatient Rx  Name  Route  Sig  Dispense  Refill  . ibuprofen (ADVIL,MOTRIN) 200 MG tablet   Oral   Take 200 mg by mouth every 6 (six) hours as needed. pain         . levonorgestrel (MIRENA) 20 MCG/24HR IUD   Intrauterine   1 each by Intrauterine route once.         . pantoprazole (PROTONIX) 40 MG tablet   Oral   Take 1 tablet (40 mg total) by mouth daily.   30 tablet   3   . meclizine (ANTIVERT) 25 MG tablet   Oral   Take 1 tablet (25 mg total) by mouth 2 (two) times daily as needed for dizziness.   28 tablet   0   . traMADol (ULTRAM) 50 MG tablet   Oral   Take 1 tablet (50 mg total) by mouth every 6 (six) hours as needed for moderate pain.   15 tablet   0    BP 115/83  Pulse 50  Temp(Src) 97.7 F (36.5 C) (Oral)  SpO2 100%  LMP 06/30/2013 Physical Exam  Nursing note and vitals reviewed. Constitutional: She is oriented to person, place, and time. She appears well-developed and well-nourished. No distress.  HENT:  Head: Normocephalic and atraumatic.    Eyes:  Pupils are equal, round, and reactive to light.  Neck: Normal range of motion. Neck supple. No Brudzinski's sign and no Kernig's sign noted.    Cardiovascular: Normal rate and regular rhythm.   Pulmonary/Chest: Effort normal. No respiratory distress.  Abdominal: Soft. There is no tenderness. There is no rebound.  Musculoskeletal: Normal range of motion.  Neurological: She is alert and oriented to person, place, and time. She has normal strength. No cranial nerve deficit or sensory deficit.  Skin: Skin is warm and dry. She is not diaphoretic.  Psychiatric: She has a normal mood and affect.    ED Course  Procedures (including critical care time) Labs Review Labs Reviewed  BASIC METABOLIC PANEL - Abnormal; Notable for the following:    Glucose, Bld 101 (*)    Creatinine, Ser 0.47 (*)    All other components within normal limits  PREGNANCY,  URINE  URINALYSIS, ROUTINE W REFLEX MICROSCOPIC  CBC WITH DIFFERENTIAL   Imaging Review No results found.  EKG Interpretation   None       MDM   1. Vertigo   2. Headache    Patient had a Head CT in 2012 which was normal, per patient. She was given Antivert, Zofran and Decadron in the ED. She has complete resolution of her symptoms. I got her up and walked her in the room myself. She endorses feeling much better. Her headache is still a mild 2/10. Will give a Vicodin 1 tab before dc. Dr. Richardo Hanks is her PCP. Can go for a recheck on Monday. Plan agreeable to Dr. Rubin Payor. Pt looks well.  Rx: antivert and ultram  32 y.o.Denise Arias's evaluation in the Emergency Department is complete. It has been determined that no acute conditions requiring further emergency intervention are present at this time. The patient/guardian have been advised of the diagnosis and plan. We have discussed signs and symptoms that warrant return to the ED, such as changes or worsening in symptoms.  Vital signs are stable at discharge. Filed Vitals:   07/29/13 1415  BP: 115/83  Pulse: 50  Temp:     Patient/guardian has voiced understanding and agreed to follow-up with the PCP or specialist.     Dorthula Matas, PA-C 07/29/13 1436

## 2013-07-29 NOTE — ED Notes (Signed)
Pt reports that she felt "hot" yesterday, but didn't have a thermometer to check her temperature. Denies fever/chills today.

## 2013-07-29 NOTE — ED Provider Notes (Signed)
Medical screening examination/treatment/procedure(s) were performed by non-physician practitioner and as supervising physician I was immediately available for consultation/collaboration.  EKG Interpretation   None        Rieley Hausman R. Yamilett Anastos, MD 07/29/13 1557 

## 2013-07-30 ENCOUNTER — Ambulatory Visit: Payer: Self-pay | Admitting: Internal Medicine

## 2013-09-02 NOTE — L&D Delivery Note (Signed)
0123: Call from nurse stating that patient having constant rectal pressure.  SVE C/C/+2.  Patient instructed on pushing methods.  Patient worried about receiving episiotomy due to female circumcision.  Patient educated regarding spontaneous tearing vs episiotomy and verbalized understanding.  Patient encouraged and delivered as below.  Infant with reassuring FHR via spot checks, but unable to maintain consistent monitoring due to position.  Delivery Note At 2:00 AM, on Dec. 9, 2015, a viable female "Denise Arias" was delivered via Vaginal, Spontaneous Delivery (Presentation: Left Occiput Anterior with Compound Left Hand ).  Manual restitution required and shoulders delivered without issues.  After delivery of body, provider immediately performed bulb suction of mouth, due to light meconium, followed by tactile stimulation.  Infant with vigorous tone, but appeared pale.  Infant APGARs: 8, 9.  Mother requested infant be cleaned prior to being placed on abdomen.  Cord clamped, cut, and blood collected. Infant taken to warmer, by nurse, to be cleaned. Placenta delivered spontaneously and noted to be intact with 3VC upon inspection.  Vaginal inspection revealed what is considered a left side labial laceration as it was within the upper portion of the female circumcision area consistent with the labia majora.  Fundus firm, at the umbilicus, and bleeding small.  Mother hemodynamically stable and infant skin to skin prior to provider exit.  Mother unsure of birth control method and opts to breastfeed.  Infant weight at one hour of life: 7lbs 11.3oz  Anesthesia: Epidural  Episiotomy: None Lacerations: Labial Suture Repair: 3.0 vicryl Est. Blood Loss (mL):  200  Mom to postpartum.  Baby to Couplet care / Skin to Skin.  Kadynce Bonds LYNN MSN, CNM 08/10/2014, 2:53 AM

## 2013-09-27 ENCOUNTER — Encounter: Payer: Self-pay | Admitting: Obstetrics & Gynecology

## 2013-09-27 ENCOUNTER — Ambulatory Visit (INDEPENDENT_AMBULATORY_CARE_PROVIDER_SITE_OTHER): Payer: BC Managed Care – PPO | Admitting: Obstetrics & Gynecology

## 2013-09-27 VITALS — BP 121/85 | HR 68 | Temp 97.6°F | Ht 64.0 in | Wt 186.6 lb

## 2013-09-27 DIAGNOSIS — R1084 Generalized abdominal pain: Secondary | ICD-10-CM

## 2013-09-27 DIAGNOSIS — Z30432 Encounter for removal of intrauterine contraceptive device: Secondary | ICD-10-CM

## 2013-09-27 DIAGNOSIS — Z309 Encounter for contraceptive management, unspecified: Secondary | ICD-10-CM

## 2013-09-27 DIAGNOSIS — T839XXA Unspecified complication of genitourinary prosthetic device, implant and graft, initial encounter: Secondary | ICD-10-CM

## 2013-09-27 DIAGNOSIS — M545 Low back pain, unspecified: Secondary | ICD-10-CM

## 2013-09-27 MED ORDER — NORGESTIMATE-ETH ESTRADIOL 0.25-35 MG-MCG PO TABS: 1.0000 | ORAL_TABLET | Freq: Every day | ORAL | Status: DC

## 2013-09-27 NOTE — Progress Notes (Signed)
Patient ID: Denise Arias, female   DOB: 08/28/1981, 33 y.o.   MRN: [id number] Pt presented with complaints of back pain, dysmenorrhea and breast tenderness since her IUD was placed.  I offered her alternative NSAIDS for the dysmenorrhea but she declines trying to maintain the IUD.    The patient was in the dorsal lithotomy position, normal external genitalia was noted.  A speculum was placed in the patient's vagina, normal discharge was noted, no lesions. The multiparous cervix was visualized, no lesions, no abnormal discharge;  and the cervix was swabbed with Betadine using scopettes. The strings of the IUD were grasped and pulled using ring forceps.  The IUD was successfully removed in its entirety.  (The strings of the IUD were not visualized, so Kelly forceps were introduced into the endometrial cavity and the IUD was grasped and removed in its entirety).  Patient tolerated the procedure well.    Pt wants to use OCP's until she has a Nexplanon placed. Sprintec q day F/u for Medtronicexplanon  Corvette Orser L. Harraway-Smith, M.D., Evern CoreFACOG

## 2013-09-27 NOTE — Patient Instructions (Signed)
Oral Contraception Information Oral contraceptive pills (OCPs) are medicines taken to prevent pregnancy. OCPs work by preventing the ovaries from releasing eggs. The hormones in OCPs also cause the cervical mucus to thicken, preventing the sperm from entering the uterus. The hormones also cause the uterine lining to become thin, not allowing a fertilized egg to attach to the inside of the uterus. OCPs are highly effective when taken exactly as prescribed. However, OCPs do not prevent sexually transmitted diseases (STDs). Safe sex practices, such as using condoms along with the pill, can help prevent STDs.  Before taking the pill, you may have a physical exam and Pap test. Your health care provider may order blood tests. The health care provider will make sure you are a good candidate for oral contraception. Discuss with your health care provider the possible side effects of the OCP you may be prescribed. When starting an OCP, it can take 2 to 3 months for the body to adjust to the changes in hormone levels in your body.  TYPES OF ORAL CONTRACEPTION  The combination pill This pill contains estrogen and progestin (synthetic progesterone) hormones. The combination pill comes in 21-day, 28-day, or 91-day packs. Some types of combination pills are meant to be taken continuously (365-day pills). With 21-day packs, you do not take pills for 7 days after the last pill. With 28-day packs, the pill is taken every day. The last 7 pills are without hormones. Certain types of pills have more than 21 hormone-containing pills. With 91-day packs, the first 84 pills contain both hormones, and the last 7 pills contain no hormones or contain estrogen only.  The minipill This pill contains the progesterone hormone only. The pill is taken every day continuously. It is very important to take the pill at the same time each day. The minipill comes in packs of 28 pills. All 28 pills contain the hormone.  ADVANTAGES OF ORAL  CONTRACEPTIVE PILLS  Decreases premenstrual symptoms.   Treats menstrual period cramps.   Regulates the menstrual cycle.   Decreases a heavy menstrual flow.   May treatacne, depending on the type of pill.   Treats abnormal uterine bleeding.   Treats polycystic ovarian syndrome.   Treats endometriosis.   Can be used as emergency contraception.  THINGS THAT CAN MAKE ORAL CONTRACEPTIVE PILLS LESS EFFECTIVE OCPs can be less effective if:   You forget to take the pill at the same time every day.   You have a stomach or intestinal disease that lessens the absorption of the pill.   You take OCPs with other medicines that make OCPs less effective, such as antibiotics, certain HIV medicines, and some seizure medicines.   You take expired OCPs.   You forget to restart the pill on day 7, when using the packs of 21 pills.  RISKS ASSOCIATED WITH ORAL CONTRACEPTIVE PILLS  Oral contraceptive pills can sometimes cause side effects, such as:  Headache.  Nausea.  Breast tenderness.  Irregular bleeding or spotting. Combination pills are also associated with a small increased risk of:  Blood clots.  Heart attack.  Stroke. Document Released: 11/09/2002 Document Revised: 06/09/2013 Document Reviewed: 02/07/2013 [facility] Patient Information 2014 [place], [place]. Etonogestrel implant What is this medicine? ETONOGESTREL (et oh noe JES trel) is a contraceptive (birth control) device. It is used to prevent pregnancy. It can be used for up to 3 years. This medicine may be used for other purposes; ask your health care provider or pharmacist if you have questions. COMMON BRAND NAME(S):  Implanon, Nexplanon  What should I tell my health care provider before I take this medicine? They need to know if you have any of these conditions: -abnormal vaginal bleeding -blood vessel disease or blood clots -cancer of the breast, cervix, or liver -depression -diabetes -gallbladder  disease -headaches -heart disease or recent heart attack -high blood pressure -high cholesterol -kidney disease -liver disease -renal disease -seizures -tobacco smoker -an unusual or allergic reaction to etonogestrel, other hormones, anesthetics or antiseptics, medicines, foods, dyes, or preservatives -pregnant or trying to get pregnant -breast-feeding How should I use this medicine? This device is inserted just under the skin on the inner side of your upper arm by a health care professional. Talk to your pediatrician regarding the use of this medicine in children. Special care may be needed. Overdosage: If you think you've taken too much of this medicine contact a poison control center or emergency room at once. Overdosage: If you think you have taken too much of this medicine contact a poison control center or emergency room at once. NOTE: This medicine is only for you. Do not share this medicine with others. What if I miss a dose? This does not apply. What may interact with this medicine? Do not take this medicine with any of the following medications: -amprenavir -bosentan -fosamprenavir This medicine may also interact with the following medications: -barbiturate medicines for inducing sleep or treating seizures -certain medicines for fungal infections like ketoconazole and itraconazole -griseofulvin -medicines to treat seizures like carbamazepine, felbamate, oxcarbazepine, phenytoin, topiramate -modafinil -phenylbutazone -rifampin -some medicines to treat HIV infection like atazanavir, indinavir, lopinavir, nelfinavir, tipranavir, ritonavir -St. John's wort This list may not describe all possible interactions. Give your health care provider a list of all the medicines, herbs, non-prescription drugs, or dietary supplements you use. Also tell them if you smoke, drink alcohol, or use illegal drugs. Some items may interact with your medicine. What should I watch for while using  this medicine? This product does not protect you against HIV infection (AIDS) or other sexually transmitted diseases. You should be able to feel the implant by pressing your fingertips over the skin where it was inserted. Tell your doctor if you cannot feel the implant. What side effects may I notice from receiving this medicine? Side effects that you should report to your doctor or health care professional as soon as possible: -allergic reactions like skin rash, itching or hives, swelling of the face, lips, or tongue -breast lumps -changes in vision -confusion, trouble speaking or understanding -dark urine -depressed mood -general ill feeling or flu-like symptoms -light-colored stools -loss of appetite, nausea -right upper belly pain -severe headaches -severe pain, swelling, or tenderness in the abdomen -shortness of breath, chest pain, swelling in a leg -signs of pregnancy -sudden numbness or weakness of the face, arm or leg -trouble walking, dizziness, loss of balance or coordination -unusual vaginal bleeding, discharge -unusually weak or tired -yellowing of the eyes or skin Side effects that usually do not require medical attention (Report these to your doctor or health care professional if they continue or are bothersome.): -acne -breast pain -changes in weight -cough -fever or chills -headache -irregular menstrual bleeding -itching, burning, and vaginal discharge -pain or difficulty passing urine -sore throat This list may not describe all possible side effects. Call your doctor for medical advice about side effects. You may report side effects to FDA at 1-800-FDA-1088. Where should I keep my medicine? This drug is given in a hospital or clinic and will not  be stored at home. NOTE: This sheet is a summary. It may not cover all possible information. If you have questions about this medicine, talk to your doctor, pharmacist, or health care provider.  2014, [employer]  [employer]. (2012-02-24 15:37:45)

## 2013-09-27 NOTE — Progress Notes (Signed)
Here to get IUD removed, has been in about a year, has been having severe cramps, back pain, breast pain  and wants to get it removed and discuss other birth control.

## 2013-09-30 ENCOUNTER — Encounter: Payer: Self-pay | Admitting: *Deleted

## 2013-10-11 ENCOUNTER — Ambulatory Visit: Payer: Self-pay | Admitting: Obstetrics & Gynecology

## 2013-11-24 ENCOUNTER — Ambulatory Visit (INDEPENDENT_AMBULATORY_CARE_PROVIDER_SITE_OTHER): Payer: BC Managed Care – PPO | Admitting: Family Medicine

## 2013-11-24 ENCOUNTER — Encounter: Payer: Self-pay | Admitting: Family Medicine

## 2013-11-24 VITALS — BP 98/64 | HR 76 | Temp 98.2°F | Ht 65.0 in | Wt 184.0 lb

## 2013-11-24 DIAGNOSIS — Z3490 Encounter for supervision of normal pregnancy, unspecified, unspecified trimester: Secondary | ICD-10-CM

## 2013-11-24 DIAGNOSIS — Z3201 Encounter for pregnancy test, result positive: Secondary | ICD-10-CM

## 2013-11-24 DIAGNOSIS — M545 Low back pain, unspecified: Secondary | ICD-10-CM

## 2013-11-24 DIAGNOSIS — R112 Nausea with vomiting, unspecified: Secondary | ICD-10-CM

## 2013-11-24 LAB — POCT URINALYSIS DIPSTICK
BILIRUBIN UA: NEGATIVE
Glucose, UA: NEGATIVE
Ketones, UA: NEGATIVE
Leukocytes, UA: NEGATIVE
Nitrite, UA: NEGATIVE
Protein, UA: NEGATIVE
RBC UA: NEGATIVE
Spec Grav, UA: <=1.005
Urobilinogen, UA: NEGATIVE
pH, UA: 7

## 2013-11-24 LAB — POCT URINE PREGNANCY: Preg Test, Ur: POSITIVE

## 2013-11-24 MED ORDER — PRENATAL MULTIVITAMIN CH: 1.0000 | ORAL_TABLET | Freq: Every day | ORAL | Status: DC

## 2013-11-24 NOTE — Progress Notes (Signed)
Chief Complaint  Patient presents with  . Nausea    and vomiting in the morning x 5 days. Had some dizziness x 5 days also. LBP and breast pain as well.    She is having morning sickness/nausea in the morning for the last 5 days.  She is having some pain in her low back, and soreness in both breasts.  She is also having some sour taste. She is a few days late on her period.  She previoiusly had IUD for contraception.  The IUD caused pain; it was removed in January.  Birth control pill prescription was sent to the pharmacy, but apparently the patient was never told about it, and never started taking them.  She and her husband have not been using condoms or other contraception.    Dr. Pennie RushingHaygood is her usual OB-GYN  Past Medical History  Diagnosis Date  . Allergy     RHINITIS  . GERD (gastroesophageal reflux disease)    Past Surgical History  Procedure Laterality Date  . Vaginal delivery      X 4   History   Social History  . Marital Status: Married    Spouse Name: N/A    Number of Children: N/A  . Years of Education: N/A   Occupational History  . Not on file.   Social History Main Topics  . Smoking status: Never Smoker   . Smokeless tobacco: Never Used  . Alcohol Use: No  . Drug Use: No  . Sexual Activity: Yes   Other Topics Concern  . Not on file   Social History Narrative  . No narrative on file   Meds:  She isn't currently taking any medications.  Allergies  Allergen Reactions  . Banana   . Fish Oil     rash  . Strawberry     rash    ROS:  Denies fevers, chills, URI symptoms, chest pain, shortness of breath.  +nausea, reflux, breast pain, low back pain, lower abdominal pain.  Denies vaginal bleeding, discharge, dysuria or other complaints.  +dizziness earlier today, resolved.  PHYSICAL EXAM: BP 98/64  Pulse 76  Temp(Src) 98.2 F (36.8 C) (Oral)  Ht 5\' 5"  (1.651 m)  Wt 184 lb (83.462 kg)  BMI 30.62 kg/m2  LMP 10/24/2013 Well developed,pleasant female in  no distress Neck: no lymphadenopathy Heart: regular rate and rhythm Lungs: clear bilaterally Back: no CVA tenderness.  Area of discomfort is lower central lumbar spine. No muscle spasm Abdomen: soft, mild suprapubic tenderness  Urine dip normal Urine pregnancy +  ASSESSMENT/PLAN:  Pregnancy - counseled re: safe meds, s/sx miscarriage, ectopic, normal pregnancy symptoms.  start PNV.  f/u with OB - Plan: Prenatal Vit-Fe Fumarate-FA (PRENATAL MULTIVITAMIN) TABS tablet  LBP (low back pain) - Plan: POCT Urinalysis Dipstick  Nausea with vomiting - Plan: POCT urine pregnancy  G5P4, with EGA [redacted]W[redacted]D EDC 07/31/14  She and her husband are happy about the pregnancy  Heat and tylenol prn low back pain.  F/u with Dr. Lilian ComaHaygood's office
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
## 2013-12-31 ENCOUNTER — Encounter (HOSPITAL_COMMUNITY): Payer: Self-pay | Admitting: *Deleted

## 2013-12-31 ENCOUNTER — Inpatient Hospital Stay (HOSPITAL_COMMUNITY)
Admission: AD | Admit: 2013-12-31 | Discharge: 2013-12-31 | Disposition: A | Discharge status: Home / Self Care | Payer: Medicaid Other | Source: Ambulatory Visit | Attending: Obstetrics and Gynecology | Admitting: Obstetrics and Gynecology

## 2013-12-31 DIAGNOSIS — Z833 Family history of diabetes mellitus: Secondary | ICD-10-CM | POA: Diagnosis not present

## 2013-12-31 DIAGNOSIS — R12 Heartburn: Secondary | ICD-10-CM | POA: Diagnosis not present

## 2013-12-31 DIAGNOSIS — IMO0002 Reserved for concepts with insufficient information to code with codable children: Secondary | ICD-10-CM | POA: Diagnosis not present

## 2013-12-31 DIAGNOSIS — O210 Mild hyperemesis gravidarum: Secondary | ICD-10-CM | POA: Insufficient documentation

## 2013-12-31 DIAGNOSIS — Z3490 Encounter for supervision of normal pregnancy, unspecified, unspecified trimester: Secondary | ICD-10-CM

## 2013-12-31 DIAGNOSIS — O219 Vomiting of pregnancy, unspecified: Secondary | ICD-10-CM

## 2013-12-31 DIAGNOSIS — R42 Dizziness and giddiness: Secondary | ICD-10-CM | POA: Diagnosis present

## 2013-12-31 LAB — CBC
HEMATOCRIT: 34.5 % — AB (ref 36.0–46.0)
HEMOGLOBIN: 11.8 g/dL — AB (ref 12.0–15.0)
MCH: 29.9 pg (ref 26.0–34.0)
MCHC: 34.2 g/dL (ref 30.0–36.0)
MCV: 87.3 fL (ref 78.0–100.0)
Platelets: 190 10*3/uL (ref 150–400)
RBC: 3.95 MIL/uL (ref 3.87–5.11)
RDW: 12.4 % (ref 11.5–15.5)
WBC: 9.7 10*3/uL (ref 4.0–10.5)

## 2013-12-31 LAB — COMPREHENSIVE METABOLIC PANEL
ALK PHOS: 52 U/L (ref 39–117)
ALT: 32 U/L (ref 0–35)
AST: 20 U/L (ref 0–37)
Albumin: 3.1 g/dL — ABNORMAL LOW (ref 3.5–5.2)
BUN: 16 mg/dL (ref 6–23)
CALCIUM: 8.7 mg/dL (ref 8.4–10.5)
CO2: 22 mEq/L (ref 19–32)
Chloride: 103 mEq/L (ref 96–112)
Creatinine, Ser: 0.47 mg/dL — ABNORMAL LOW (ref 0.50–1.10)
GFR calc non Af Amer: >90 mL/min (ref >90)
GLUCOSE: 104 mg/dL — AB (ref 70–99)
POTASSIUM: 4 meq/L (ref 3.7–5.3)
Sodium: 139 mEq/L (ref 137–147)
Total Bilirubin: <0.2 mg/dL — ABNORMAL LOW (ref 0.3–1.2)
Total Protein: 6.5 g/dL (ref 6.0–8.3)

## 2013-12-31 LAB — URINALYSIS, ROUTINE W REFLEX MICROSCOPIC
Bilirubin Urine: NEGATIVE
GLUCOSE, UA: NEGATIVE mg/dL
Hgb urine dipstick: NEGATIVE
KETONES UR: NEGATIVE mg/dL
Nitrite: NEGATIVE
Protein, ur: NEGATIVE mg/dL
Specific Gravity, Urine: >1.03 — ABNORMAL HIGH (ref 1.005–1.030)
UROBILINOGEN UA: 0.2 mg/dL (ref 0.0–1.0)
pH: 6 (ref 5.0–8.0)

## 2013-12-31 LAB — URINE MICROSCOPIC-ADD ON

## 2013-12-31 MED ORDER — FAMOTIDINE IN NACL 20-0.9 MG/50ML-% IV SOLN
20.0000 mg | Freq: Once | INTRAVENOUS | Status: AC
  Administered 2013-12-31: 20 mg via INTRAVENOUS
  Filled 2013-12-31: qty 50

## 2013-12-31 MED ORDER — FAMOTIDINE 20 MG PO TABS: 20.0000 mg | ORAL_TABLET | Freq: Two times a day (BID) | ORAL | Status: DC

## 2013-12-31 MED ORDER — LACTATED RINGERS IV BOLUS (SEPSIS): 1000.0000 mL | Freq: Once | INTRAVENOUS | Status: DC

## 2013-12-31 MED ORDER — PROMETHAZINE HCL 12.5 MG PO TABS: 12.5000 mg | ORAL_TABLET | Freq: Four times a day (QID) | ORAL | Status: DC | PRN

## 2013-12-31 MED ORDER — PROMETHAZINE HCL 25 MG/ML IJ SOLN
25.0000 mg | Freq: Once | INTRAVENOUS | Status: DC
  Administered 2013-12-31: 25 mg via INTRAVENOUS
  Filled 2013-12-31: qty 1

## 2013-12-31 MED ORDER — PRENATAL MULTIVITAMIN CH: 1.0000 | ORAL_TABLET | Freq: Every day | ORAL | Status: DC

## 2013-12-31 NOTE — MAU Note (Signed)
Pt currently has a bottle of Zofran in purse.  Pt states she tried to take it one time and it made her abd hurt more.  She also stated she wasn't able to have a BM and had difficulty sleeping with it.  She desires to use a different med for the N/V.

## 2013-12-31 NOTE — MAU Provider Note (Signed)
History     CSN: [id number]  Arrival date and time: 12/31/13 [id number]   First Provider Initiated Contact with Patient 12/31/13 2056      Chief Complaint  Patient presents with  . Dizziness  . Emesis During Pregnancy   HPI Ms. Denise Arias is a 33 y.o. [id number] at 4453w5d who presents to MAU today with N/V. The patient states that she has had N/V for > 2 weeks. She states that the N/V has been worse over the last week. She denies headache, abdominal pain, vaginal bleeding or discharge. She has had heartburn and fatigue.   OB History   Grav Para Term Preterm Abortions TAB SAB Ect Mult Living   5 4 4       4       Past Medical History  Diagnosis Date  . Allergy     RHINITIS  . GERD (gastroesophageal reflux disease)     Past Surgical History  Procedure Laterality Date  . Vaginal delivery      X 4    Family History  Problem Relation Age of Onset  . Diabetes Mother     History  Substance Use Topics  . Smoking status: Never Smoker   . Smokeless tobacco: Never Used  . Alcohol Use: No    Allergies:  Allergies  Allergen Reactions  . Banana   . Fish Oil     rash  . Strawberry     rash    Prescriptions prior to admission  Medication Sig Dispense Refill  . ibuprofen (ADVIL,MOTRIN) 200 MG tablet Take 200 mg by mouth every 6 (six) hours as needed. pain      . levonorgestrel (MIRENA) 20 MCG/24HR IUD 1 each by Intrauterine route once.      . meclizine (ANTIVERT) 25 MG tablet Take 1 tablet (25 mg total) by mouth 2 (two) times daily as needed for dizziness.  28 tablet  0  . norgestimate-ethinyl estradiol (ORTHO-CYCLEN,SPRINTEC,PREVIFEM) 0.25-35 MG-MCG tablet Take 1 tablet by mouth daily.  1 Package  11  . Prenatal Vit-Fe Fumarate-FA (PRENATAL MULTIVITAMIN) TABS tablet Take 1 tablet by mouth daily.  30 tablet  11  . traMADol (ULTRAM) 50 MG tablet Take 1 tablet (50 mg total) by mouth every 6 (six) hours as needed for moderate pain.  15 tablet  0    Review of Systems   Constitutional: Positive for malaise/fatigue. Negative for fever.  Gastrointestinal: Positive for nausea and vomiting. Negative for abdominal pain, diarrhea and constipation.  Genitourinary:       Neg - vaginal bleeding, discharge  Neurological: Negative for dizziness, loss of consciousness and headaches.   Physical Exam   Blood pressure 110/69, pulse 65, temperature 98.3 F (36.8 C), resp. rate 18, height 5\' 4"  (1.626 m), weight 179 lb 12.8 oz (81.557 kg), last menstrual period 10/24/2013, unknown if currently breastfeeding.  Physical Exam  Constitutional: She is oriented to person, place, and time. She appears well-developed and well-nourished. No distress.  HENT:  Head: Normocephalic and atraumatic.  Cardiovascular: Normal rate, regular rhythm and normal heart sounds.   Respiratory: Effort normal and breath sounds normal. No respiratory distress.  GI: Soft. Bowel sounds are normal. She exhibits no distension and no mass. There is tenderness (epigastric tenderness to palpation). There is no rebound and no guarding.  Neurological: She is alert and oriented to person, place, and time.  Skin: Skin is warm and dry. No erythema.  Psychiatric: She has a normal mood and affect.   Results for  orders placed during the hospital encounter of 12/31/13 (from the past 24 hour(s))  URINALYSIS, ROUTINE W REFLEX MICROSCOPIC     Status: Abnormal   Collection Time    12/31/13  8:08 PM      Result Value Ref Range   Color, Urine YELLOW  YELLOW   APPearance CLEAR  CLEAR   Specific Gravity, Urine >1.030 (*) 1.005 - 1.030   pH 6.0  5.0 - 8.0   Glucose, UA NEGATIVE  NEGATIVE mg/dL   Hgb urine dipstick NEGATIVE  NEGATIVE   Bilirubin Urine NEGATIVE  NEGATIVE   Ketones, ur NEGATIVE  NEGATIVE mg/dL   Protein, ur NEGATIVE  NEGATIVE mg/dL   Urobilinogen, UA 0.2  0.0 - 1.0 mg/dL   Nitrite NEGATIVE  NEGATIVE   Leukocytes, UA TRACE (*) NEGATIVE  URINE MICROSCOPIC-ADD ON     Status: Abnormal   Collection  Time    12/31/13  8:08 PM      Result Value Ref Range   Squamous Epithelial / LPF FEW (*) RARE   WBC, UA 3-6  <3 WBC/hpf   Bacteria, UA FEW (*) RARE   Urine-Other MUCOUS PRESENT        MAU Course  Procedures None  MDM UA today shows signs of dehydration CBC, CMP today 1 liter IV LR 20 mg Pepcid IVPB Patient will try to find a ride home so she can have Phenergan today Assessment and Plan  A: Nausea and vomiting in pregnancy prior to [redacted] weeks gestation Heartburn  P: Discharge home Rx for Phenergan and Pepcid given/sent to patient's pharmacy Refill for Prenatal vitamins sent to patient's pharmacy Patient advised to increase PO hydration and advance diet as tolerated Patient advised to start prenatal care ASAP. Plans to go to [facility], but is waiting for Medicaid Patient may return to MAU as needed or if her condition were to change or worsen  Freddi StarrJulie N Ethier, PA-C  12/31/2013, 8:56 PM

## 2013-12-31 NOTE — Discharge Instructions (Signed)
Morning Sickness °Morning sickness is when you feel sick to your stomach (nauseous) during pregnancy. You may feel sick to your stomach and throw up (vomit). You may feel sick in the morning, but you can feel this way any time of day. Some women feel very sick to their stomach and cannot stop throwing up (hyperemesis gravidarum). °HOME CARE °· Only take medicines as told by your doctor. °· Take multivitamins as told by your doctor. Taking multivitamins before getting pregnant can stop or lessen the harshness of morning sickness. °· Eat dry toast or unsalted crackers before getting out of bed. °· Eat 5 to 6 small meals a day. °· Eat dry and bland foods like rice and baked potatoes. °· Do not drink liquids with meals. Drink between meals. °· Do not eat greasy, fatty, or spicy foods. °· Have someone cook for you if the smell of food causes you to feel sick or throw up. °· If you feel sick to your stomach after taking prenatal vitamins, take them at night or with a snack. °· Eat protein when you need a snack (nuts, yogurt, cheese). °· Eat unsweetened gelatins for dessert. °· Wear a bracelet used for sea sickness (acupressure wristband). °· Go to a doctor that puts thin needles into certain body points (acupuncture) to improve how you feel. °· Do not smoke. °· Use a humidifier to keep the air in your house free of odors. °· Get lots of fresh air. °GET HELP IF: °· You need medicine to feel better. °· You feel dizzy or lightheaded. °· You are losing weight. °GET HELP RIGHT AWAY IF:  °· You feel very sick to your stomach and cannot stop throwing up. °· You pass out (faint). °Document Released: 09/26/2004 Document Revised: 04/21/2013 Document Reviewed: 02/03/2013 °ExitCare® Patient Information ©2014 [employer]. ° °Hyperemesis Gravidarum Diet °Hyperemesis gravidarum is a severe form of morning sickness. It is characterized by frequent and severe vomiting. It happens during the first trimester of pregnancy. It may be caused  by the rapid hormone changes that happen during pregnancy. It is associated with a 5% weight loss of pre-pregnancy weight. The hyperemesis diet may be used to lessen symptoms of nausea and vomiting. °EATING GUIDELINES °· Eat 5 to 6 small meals daily instead of 3 large meals. °· Avoid foods with strong smells. °· Avoid drinking 30 minutes before and after meals. °· Avoid fried or high-fat foods, such as butter and cream sauces. °· Starchy foods are usually well-tolerated, such as cereal, toast, bread, potatoes, pasta, rice, and pretzels. °· Eat crackers before you get out of bed in the morning. °· Avoid spicy foods. °· Ginger may help with nausea. Add ¼ tsp ginger to hot tea or choose ginger tea. °· Continue to take your prenatal vitamins as directed by your caregiver. °SAMPLE MEAL PLAN °Breakfast  °· ½ cup oatmeal °· 1 slice toast °· 1 tsp heart-healthy margarine °· 1 tsp jelly °· 1 scrambled egg °Midmorning Snack  °· 1 cup low-fat yogurt °Lunch  °· Plain ham sandwich °· Carrot or celery sticks °· 1 small apple °· 3 graham crackers °Midafternoon Snack  °· Cheese and crackers °Dinner °· 4 oz pork tenderloin °· 1 small baked potato °· 1 tsp margarine °· ½ cup broccoli °· ½ cup grapes °Evening Snack °· 1 cup pudding °Document Released: 06/16/2007 Document Revised: 11/11/2011 Document Reviewed: 01/19/2013 °ExitCare® Patient Information ©2014 Exit[employer], LLC. ° °

## 2013-12-31 NOTE — MAU Note (Signed)
Can't keep anything down. Very tired and some dizziness.

## 2014-01-01 NOTE — MAU Provider Note (Signed)
Attestation of Attending Supervision of Advanced Practitioner (CNM/NP): Evaluation and management procedures were performed by the Advanced Practitioner under my supervision and collaboration.  I have reviewed the Advanced Practitioner's note and chart, and I agree with the management and plan.  Denise Arias 01/01/2014 7:48 AM

## 2014-01-16 ENCOUNTER — Other Ambulatory Visit: Payer: Self-pay | Admitting: Medical

## 2014-02-09 LAB — OB RESULTS CONSOLE GC/CHLAMYDIA
Chlamydia: NEGATIVE
Gonorrhea: NEGATIVE

## 2014-02-09 LAB — OB RESULTS CONSOLE ABO/RH: RH TYPE: POSITIVE

## 2014-02-09 LAB — OB RESULTS CONSOLE HEPATITIS B SURFACE ANTIGEN: HEP B S AG: NEGATIVE

## 2014-02-09 LAB — OB RESULTS CONSOLE HIV ANTIBODY (ROUTINE TESTING): HIV: NONREACTIVE

## 2014-02-09 LAB — OB RESULTS CONSOLE RUBELLA ANTIBODY, IGM: RUBELLA: IMMUNE

## 2014-02-09 LAB — OB RESULTS CONSOLE RPR: RPR: NONREACTIVE

## 2014-02-09 LAB — OB RESULTS CONSOLE ANTIBODY SCREEN: ANTIBODY SCREEN: NEGATIVE

## 2014-07-04 ENCOUNTER — Encounter (HOSPITAL_COMMUNITY): Payer: Self-pay | Admitting: *Deleted

## 2014-07-19 ENCOUNTER — Inpatient Hospital Stay (HOSPITAL_COMMUNITY)
Admission: AD | Admit: 2014-07-19 | Discharge: 2014-07-19 | Disposition: A | Discharge status: Home / Self Care | Payer: Medicaid Other | Source: Ambulatory Visit | Attending: Obstetrics and Gynecology | Admitting: Obstetrics and Gynecology

## 2014-07-19 ENCOUNTER — Encounter (HOSPITAL_COMMUNITY): Payer: Self-pay | Admitting: *Deleted

## 2014-07-19 DIAGNOSIS — O26893 Other specified pregnancy related conditions, third trimester: Secondary | ICD-10-CM | POA: Diagnosis present

## 2014-07-19 DIAGNOSIS — Z3A39 39 weeks gestation of pregnancy: Secondary | ICD-10-CM | POA: Insufficient documentation

## 2014-07-19 DIAGNOSIS — K219 Gastro-esophageal reflux disease without esophagitis: Secondary | ICD-10-CM | POA: Insufficient documentation

## 2014-07-19 LAB — COMPREHENSIVE METABOLIC PANEL
ALK PHOS: 108 U/L (ref 39–117)
ALT: 16 U/L (ref 0–35)
AST: 31 U/L (ref 0–37)
Albumin: 2.9 g/dL — ABNORMAL LOW (ref 3.5–5.2)
Anion gap: 14 (ref 5–15)
BUN: 11 mg/dL (ref 6–23)
CALCIUM: 8.9 mg/dL (ref 8.4–10.5)
CO2: 20 mEq/L (ref 19–32)
Chloride: 102 mEq/L (ref 96–112)
Creatinine, Ser: 0.48 mg/dL — ABNORMAL LOW (ref 0.50–1.10)
GFR calc Af Amer: >90 mL/min (ref >90)
GFR calc non Af Amer: >90 mL/min (ref >90)
Glucose, Bld: 150 mg/dL — ABNORMAL HIGH (ref 70–99)
POTASSIUM: 4 meq/L (ref 3.7–5.3)
Sodium: 136 mEq/L — ABNORMAL LOW (ref 137–147)
TOTAL PROTEIN: 6.5 g/dL (ref 6.0–8.3)
Total Bilirubin: 0.4 mg/dL (ref 0.3–1.2)

## 2014-07-19 LAB — CBC
HCT: 31.6 % — ABNORMAL LOW (ref 36.0–46.0)
Hemoglobin: 10.9 g/dL — ABNORMAL LOW (ref 12.0–15.0)
MCH: 29.9 pg (ref 26.0–34.0)
MCHC: 34.5 g/dL (ref 30.0–36.0)
MCV: 86.8 fL (ref 78.0–100.0)
Platelets: 191 10*3/uL (ref 150–400)
RBC: 3.64 MIL/uL — AB (ref 3.87–5.11)
RDW: 12.8 % (ref 11.5–15.5)
WBC: 8.8 10*3/uL (ref 4.0–10.5)

## 2014-07-19 LAB — LIPASE, BLOOD: Lipase: 23 U/L (ref 11–59)

## 2014-07-19 LAB — AMYLASE: AMYLASE: 55 U/L (ref 0–105)

## 2014-07-19 MED ORDER — PANTOPRAZOLE SODIUM 40 MG PO TBEC: 40.0000 mg | DELAYED_RELEASE_TABLET | Freq: Two times a day (BID) | ORAL | Status: DC

## 2014-07-19 MED ORDER — PANTOPRAZOLE SODIUM 20 MG PO TBEC: 20.0000 mg | DELAYED_RELEASE_TABLET | Freq: Two times a day (BID) | ORAL | Status: DC

## 2014-07-19 MED ORDER — GI COCKTAIL ~~LOC~~
30.0000 mL | Freq: Once | ORAL | Status: AC
  Administered 2014-07-19: 30 mL via ORAL
  Filled 2014-07-19: qty 30

## 2014-07-19 NOTE — MAU Note (Signed)
Pt. Moaning and uncomfortable with reflux, J. Emly coming to unit to assess patient.

## 2014-07-19 NOTE — MAU Note (Signed)
Pt taking sips of water po and states she is feeling better.

## 2014-07-19 NOTE — MAU Note (Signed)
Pt presented with reflux and extreme pain in upper chest. Pt had some food at 0100 and then lied down. Pt took diclegis to see if it would help.

## 2014-07-19 NOTE — Discharge Instructions (Signed)
Gastroesophageal Reflux Disease, Adult Gastroesophageal reflux disease (GERD) happens when acid from your stomach flows up into the esophagus. When acid comes in contact with the esophagus, the acid causes soreness (inflammation) in the esophagus. Over time, GERD may create small holes (ulcers) in the lining of the esophagus. CAUSES   Increased body weight. This puts pressure on the stomach, making acid rise from the stomach into the esophagus.  Smoking. This increases acid production in the stomach.  Drinking alcohol. This causes decreased pressure in the lower esophageal sphincter (valve or ring of muscle between the esophagus and stomach), allowing acid from the stomach into the esophagus.  Late evening meals and a full stomach. This increases pressure and acid production in the stomach.  A malformed lower esophageal sphincter. Sometimes, no cause is found. SYMPTOMS   Burning pain in the lower part of the mid-chest behind the breastbone and in the mid-stomach area. This may occur twice a week or more often.  Trouble swallowing.  Sore throat.  Dry cough.  Asthma-like symptoms including chest tightness, shortness of breath, or wheezing. DIAGNOSIS  Your caregiver may be able to diagnose GERD based on your symptoms. In some cases, X-rays and other tests may be done to check for complications or to check the condition of your stomach and esophagus. TREATMENT  Your caregiver may recommend over-the-counter or prescription medicines to help decrease acid production. Ask your caregiver before starting or adding any new medicines.  HOME CARE INSTRUCTIONS   Change the factors that you can control. Ask your caregiver for guidance concerning weight loss, quitting smoking, and alcohol consumption.  Avoid foods and drinks that make your symptoms worse, such as:  Caffeine or alcoholic drinks.  Chocolate.  Peppermint or mint flavorings.  Garlic and onions.  Spicy foods.  Citrus fruits,  such as oranges, lemons, or limes.  Tomato-based foods such as sauce, chili, salsa, and pizza.  Fried and fatty foods.  Avoid lying down for the 3 hours prior to your bedtime or prior to taking a nap.  Eat small, frequent meals instead of large meals.  Wear loose-fitting clothing. Do not wear anything tight around your waist that causes pressure on your stomach.  Raise the head of your bed 6 to 8 inches with wood blocks to help you sleep. Extra pillows will not help.  Only take over-the-counter or prescription medicines for pain, discomfort, or fever as directed by your caregiver.  Do not take aspirin, ibuprofen, or other nonsteroidal anti-inflammatory drugs (NSAIDs). SEEK IMMEDIATE MEDICAL CARE IF:   You have pain in your arms, neck, jaw, teeth, or back.  Your pain increases or changes in intensity or duration.  You develop nausea, vomiting, or sweating (diaphoresis).  You develop shortness of breath, or you faint.  Your vomit is green, yellow, black, or looks like coffee grounds or blood.  Your stool is red, bloody, or black. These symptoms could be signs of other problems, such as heart disease, gastric bleeding, or esophageal bleeding. MAKE SURE YOU:   Understand these instructions.  Will watch your condition.  Will get help right away if you are not doing well or get worse. Document Released: 05/29/2005 Document Revised: 11/11/2011 Document Reviewed: 03/08/2011 ExitCare Patient Information 2015 [employer]Care[employer]. This information is not intended to replace advice given to you by your health care provider. Make sure you discuss any questions you have with your health care provider.  

## 2014-07-19 NOTE — MAU Note (Signed)
Lab in

## 2014-07-19 NOTE — MAU Note (Signed)
Pt verbalized understanding that she needs to call the office today to schedule an appointment before the end of the week. Pt verbalized understanding of the need to take prescription every day. Pt understands that the remaining blood test results will be communicated at next appointment as pt wants to leave to care for children at home.

## 2014-07-19 NOTE — MAU Provider Note (Signed)
History    Denise Arias is a 33 y.o. [id number] at 38.2wks who presents, unannounced, for upper epigastric pain.  Patient reports eating cheese and honey at 1 am and then laid down.  She soon began to experience pain and reported to hospital.  Patient denies VB, LOF, and Ctx and reports active fetus.  Patient states that pain makes it impossible to sit and that it is intermittent in nature.  Patient also reports an incident of vomiting prior to arrival.  States was a small amount with no blood noted.  24 hour recall performed--intake inadequate. Patient denies attempting any OTC medications prior to arrival and states she took diclegis.    Patient Active Problem List   Diagnosis Date Noted  . GERD (gastroesophageal reflux disease) 05/27/2013    Chief Complaint  Patient presents with  . Gastrophageal Reflux   HPI  OB History    Gravida Para Term Preterm AB TAB SAB Ectopic Multiple Living   6 4 4       4       Past Medical History  Diagnosis Date  . Allergy     RHINITIS  . GERD (gastroesophageal reflux disease)     Past Surgical History  Procedure Laterality Date  . Vaginal delivery      X 4    Family History  Problem Relation Age of Onset  . Diabetes Mother     History  Substance Use Topics  . Smoking status: Never Smoker   . Smokeless tobacco: Never Used  . Alcohol Use: No    Allergies:  Allergies  Allergen Reactions  . Banana Hives  . Fish Oil     rash  . Strawberry     rash    Prescriptions prior to admission  Medication Sig Dispense Refill Last Dose  . Acetaminophen (TYLENOL PO) Take 1 tablet by mouth daily as needed (headache).   12/30/2013 at Unknown time  . famotidine (PEPCID) 20 MG tablet TAKE 1 TABLET BY MOUTH TWICE A DAY 30 tablet 0   . Prenatal Vit-Fe Fumarate-FA (PRENATAL MULTIVITAMIN) TABS tablet Take 1 tablet by mouth daily. 30 tablet 11   . promethazine (PHENERGAN) 12.5 MG tablet Take 1 tablet (12.5 mg total) by mouth every 6 (six) hours as  needed for nausea or vomiting. 30 tablet 0      Review of Systems  Constitutional: Negative.   HENT: Negative.   Eyes: Negative.   Gastrointestinal: Positive for heartburn, vomiting and abdominal pain.  Genitourinary: Negative.   Neurological: Negative.     See HPI Above 24 Hr Recall Breakfast: Nothing Lunch: Glass of Milk Snack: 1/2 C veggies & 2 thin strips meat Dinner: Nothing Snack: Chz and honey sandwich  Physical Exam   Last menstrual period 10/24/2013, unknown if currently breastfeeding.  Physical Exam  Constitutional: She is oriented to person, place, and time. She appears distressed.  Cardiovascular: Normal rate, regular rhythm and normal heart sounds.   Respiratory: Effort normal and breath sounds normal.  GI: Soft. Bowel sounds are normal.  Genitourinary:  Deferred   Neurological: She is alert and oriented to person, place, and time.   FHR: 150 bpm, Mod Var, -Decels, +Accels UC: mild, irregular, graphed ED Course  Assessment: IUP at 38.2wks Cat I FT Severe GERD  Plan: -GI cocktail -Labs: CBC, CMP, amylase, lipase -PE as charted  Follow Up (0510) -CBC: WNL -CMP, Amylase, Lipase-Pending -Patient requests discharge due to needing to ready other children for day -Discussed Rx for protonix -  Discussed will be contacted for any abnormal labs -Make ROB today once office opens -Educated on GERD Diet -Encouraged to call if any questions or concerns arise prior to next scheduled office visit.  -Labor Precautions -Discharged to home in stable condition   Georgi Tuel LYNN CNM, MSN 07/19/2014 3:06 AM

## 2014-08-02 LAB — OB RESULTS CONSOLE GBS: STREP GROUP B AG: POSITIVE

## 2014-08-07 ENCOUNTER — Inpatient Hospital Stay (HOSPITAL_COMMUNITY): Payer: Medicaid Other

## 2014-08-09 ENCOUNTER — Encounter (HOSPITAL_COMMUNITY): Payer: Self-pay

## 2014-08-09 ENCOUNTER — Inpatient Hospital Stay (HOSPITAL_COMMUNITY)
Admission: RE | Admit: 2014-08-09 | Discharge: 2014-08-12 | DRG: 775 | Disposition: A | Discharge status: Home / Self Care | Payer: Medicaid Other | Source: Ambulatory Visit | Attending: Obstetrics and Gynecology | Admitting: Obstetrics and Gynecology

## 2014-08-09 DIAGNOSIS — Z833 Family history of diabetes mellitus: Secondary | ICD-10-CM

## 2014-08-09 DIAGNOSIS — O99824 Streptococcus B carrier state complicating childbirth: Secondary | ICD-10-CM | POA: Diagnosis present

## 2014-08-09 DIAGNOSIS — B951 Streptococcus, group B, as the cause of diseases classified elsewhere: Secondary | ICD-10-CM | POA: Diagnosis present

## 2014-08-09 DIAGNOSIS — O480 Post-term pregnancy: Secondary | ICD-10-CM | POA: Diagnosis present

## 2014-08-09 DIAGNOSIS — O09523 Supervision of elderly multigravida, third trimester: Secondary | ICD-10-CM

## 2014-08-09 DIAGNOSIS — O3483 Maternal care for other abnormalities of pelvic organs, third trimester: Secondary | ICD-10-CM | POA: Diagnosis present

## 2014-08-09 DIAGNOSIS — J309 Allergic rhinitis, unspecified: Secondary | ICD-10-CM | POA: Diagnosis present

## 2014-08-09 DIAGNOSIS — K219 Gastro-esophageal reflux disease without esophagitis: Secondary | ICD-10-CM | POA: Diagnosis present

## 2014-08-09 DIAGNOSIS — N90810 Female genital mutilation status, unspecified: Secondary | ICD-10-CM | POA: Diagnosis present

## 2014-08-09 DIAGNOSIS — Z3A41 41 weeks gestation of pregnancy: Secondary | ICD-10-CM | POA: Diagnosis present

## 2014-08-09 LAB — CBC
HCT: 31.9 % — ABNORMAL LOW (ref 36.0–46.0)
Hemoglobin: 10.9 g/dL — ABNORMAL LOW (ref 12.0–15.0)
MCH: 29.5 pg (ref 26.0–34.0)
MCHC: 34.2 g/dL (ref 30.0–36.0)
MCV: 86.4 fL (ref 78.0–100.0)
PLATELETS: 202 10*3/uL (ref 150–400)
RBC: 3.69 MIL/uL — ABNORMAL LOW (ref 3.87–5.11)
RDW: 13.5 % (ref 11.5–15.5)
WBC: 7.5 10*3/uL (ref 4.0–10.5)

## 2014-08-09 MED ORDER — FENTANYL 2.5 MCG/ML BUPIVACAINE 1/10 % EPIDURAL INFUSION (WH - ANES)
14.0000 mL/h | INTRAMUSCULAR | Status: DC | PRN
  Filled 2014-08-09: qty 125

## 2014-08-09 MED ORDER — EPHEDRINE 5 MG/ML INJ
10.0000 mg | INTRAVENOUS | Status: DC | PRN
Start: 2014-08-09 — End: 2014-08-10
  Filled 2014-08-09: qty 2

## 2014-08-09 MED ORDER — EPHEDRINE 5 MG/ML INJ
10.0000 mg | INTRAVENOUS | Status: DC | PRN
  Filled 2014-08-09: qty 2

## 2014-08-09 MED ORDER — PHENYLEPHRINE 40 MCG/ML (10ML) SYRINGE FOR IV PUSH (FOR BLOOD PRESSURE SUPPORT)
80.0000 ug | PREFILLED_SYRINGE | INTRAVENOUS | Status: DC | PRN
  Filled 2014-08-09: qty 2

## 2014-08-09 MED ORDER — CITRIC ACID-SODIUM CITRATE 334-500 MG/5ML PO SOLN: 30.0000 mL | ORAL | Status: DC | PRN

## 2014-08-09 MED ORDER — DIPHENHYDRAMINE HCL 50 MG/ML IJ SOLN
12.5000 mg | INTRAMUSCULAR | Status: DC | PRN
Start: 2014-08-09 — End: 2014-08-10

## 2014-08-09 MED ORDER — PHENYLEPHRINE 40 MCG/ML (10ML) SYRINGE FOR IV PUSH (FOR BLOOD PRESSURE SUPPORT)
80.0000 ug | PREFILLED_SYRINGE | INTRAVENOUS | Status: DC | PRN
  Filled 2014-08-09: qty 2
  Filled 2014-08-09: qty 10

## 2014-08-09 MED ORDER — ACETAMINOPHEN 325 MG PO TABS: 650.0000 mg | ORAL_TABLET | ORAL | Status: DC | PRN

## 2014-08-09 MED ORDER — OXYTOCIN 40 UNITS IN LACTATED RINGERS INFUSION - SIMPLE MED: 62.5000 mL/h | INTRAVENOUS | Status: DC

## 2014-08-09 MED ORDER — PENICILLIN G POTASSIUM 5000000 UNITS IJ SOLR: 5.0000 10*6.[IU] | INTRAMUSCULAR | Status: DC

## 2014-08-09 MED ORDER — PENICILLIN G POTASSIUM 5000000 UNITS IJ SOLR
2.5000 10*6.[IU] | INTRAVENOUS | Status: DC
  Administered 2014-08-10: 2.5 10*6.[IU] via INTRAVENOUS
  Filled 2014-08-09 (×5): qty 2.5

## 2014-08-09 MED ORDER — OXYTOCIN BOLUS FROM INFUSION
500.0000 mL | INTRAVENOUS | Status: DC
  Administered 2014-08-10: 500 mL via INTRAVENOUS

## 2014-08-09 MED ORDER — PENICILLIN G POTASSIUM 5000000 UNITS IJ SOLR
5.0000 10*6.[IU] | Freq: Once | INTRAVENOUS | Status: AC
  Administered 2014-08-09: 5 10*6.[IU] via INTRAVENOUS
  Filled 2014-08-09: qty 5

## 2014-08-09 MED ORDER — FLEET ENEMA 7-19 GM/118ML RE ENEM: 1.0000 | ENEMA | RECTAL | Status: DC | PRN

## 2014-08-09 MED ORDER — TERBUTALINE SULFATE 1 MG/ML IJ SOLN: 0.2500 mg | Freq: Once | INTRAMUSCULAR | Status: AC | PRN

## 2014-08-09 MED ORDER — ONDANSETRON HCL 4 MG/2ML IJ SOLN: 4.0000 mg | Freq: Four times a day (QID) | INTRAMUSCULAR | Status: DC | PRN

## 2014-08-09 MED ORDER — LACTATED RINGERS IV SOLN: 500.0000 mL | INTRAVENOUS | Status: DC | PRN

## 2014-08-09 MED ORDER — LACTATED RINGERS IV SOLN: INTRAVENOUS | Status: DC

## 2014-08-09 MED ORDER — LACTATED RINGERS IV SOLN
500.0000 mL | Freq: Once | INTRAVENOUS | Status: AC
  Administered 2014-08-09: 500 mL via INTRAVENOUS

## 2014-08-09 MED ORDER — OXYCODONE-ACETAMINOPHEN 5-325 MG PO TABS: 2.0000 | ORAL_TABLET | ORAL | Status: DC | PRN

## 2014-08-09 MED ORDER — OXYTOCIN 40 UNITS IN LACTATED RINGERS INFUSION - SIMPLE MED
1.0000 m[IU]/min | INTRAVENOUS | Status: DC
  Administered 2014-08-09: 3 m[IU]/min via INTRAVENOUS
  Administered 2014-08-09: 2 m[IU]/min via INTRAVENOUS
  Filled 2014-08-09: qty 1000

## 2014-08-09 MED ORDER — LIDOCAINE HCL (PF) 1 % IJ SOLN
30.0000 mL | INTRAMUSCULAR | Status: DC | PRN
  Filled 2014-08-09: qty 30

## 2014-08-09 MED ORDER — OXYCODONE-ACETAMINOPHEN 5-325 MG PO TABS: 1.0000 | ORAL_TABLET | ORAL | Status: DC | PRN

## 2014-08-09 NOTE — Progress Notes (Signed)
Denise Arias, [id number]   Subjective -Patient ready for evaluation for postdates induction.  Reports that allergic reaction, on leg, has improved and last dose of Keflex was today. Denies LoF, VB, and reports active fetus.  Reports occasional contraction.    Objective Filed Vitals:   08/09/14 1927  BP: 99/57  Pulse: 73        Physical Exam  Constitutional: She is oriented to person, place, and time and well-developed, well-nourished, and in no distress.  Cardiovascular: Normal rate, regular rhythm and normal heart sounds.   Pulmonary/Chest: Effort normal and breath sounds normal.  Abdominal: Soft. Bowel sounds are normal.  Musculoskeletal: Normal range of motion.  Neurological: She is alert and oriented to person, place, and time.  Skin: Skin is warm, dry and intact. Rash noted. Rash is pustular.  Rash on left lower leg at henna tattoo site.    Vitals reviewed.   WGN:[id number]:Dilation: 3 Effacement (%): 80 Cervical Position: Posterior Station: -2 Presentation: Vertex Exam by::  Clyde LundborgJessica Emily CNM  Pelvis: -Proven & Adequate Leopolds: -Position: Vertex -EFW:~7 1/2lbs  FHR: 125 bpm, Mod Var, -Decels, +Accels UC:  Occassional   Induction/Augmentation Agent: Pitocin: Initiate at 582mUn/min Cytotec: None Membranes: Intact  Assessment IUP at 41.2wks Cat I FT Bishop Score: 8 Pitocin IOL  Plan -Discussed induction methods including cervical ripening agents and pitocin -Discussed plan to use pitocin with anticipation of SVD -Questions and concerns addressed, patient verbalizes understanding -Continue other mgmt as ordered  Javone Ybanez LYNN, CNM 08/09/2014, 8:12 PM  Addendum: 2100 Confirmed vertex by US, per hospital protocol, for pitocin initiation

## 2014-08-09 NOTE — Progress Notes (Signed)
Christain SacramentoRazaz Badawi Matsumura MRN: [id number]  Subjective: -Patient requesting epidural.  Strip Reviewed. Nurse call request VE. In room to assess.  Patient visually uncomfortable with contractions.    Objective: BP 107/55 mmHg  Pulse 79  Temp(Src) 98.3 F (36.8 C) (Oral)  Resp 18  Ht 5\' 4"  (1.626 m)  Wt 191 lb (86.637 kg)  BMI 32.77 kg/m2  LMP 10/24/2013     FHT: 130  bpm, Mod Var, -Decels, +Accels UC:  Q1-503min, palpates moderate  SVE:   Dilation: 5 Effacement (%): 90 Station: -1 Exam by:: Errika Narvaiz CNM Membranes: Intact Pitocin:465mUn/min  Assessment:  IUP at 41.3wks Cat I FT  PostDates Pitocin IOL GBS Positive  Plan: -Okay for epidural -Provider remained at bedside until anesthesiologist in room and halfway through placement -Will continue to observe and consider AROM after epidural placement -Continue other mgmt as ordered  Jackson SouthEMLY, Markeshia Giebel LYNN,MSN, CNM 08/09/2014, 11:47 PM

## 2014-08-09 NOTE — H&P (Signed)
Denise Arias is a 33 y.o. female, G5P4004 at 41.1 weeks, presenting for IOL for postdates.  Patient is GBS positive and confirmed Vertex by US today.  Patient with female circumcision, but reports no problems with prior vaginal deliveries.    Patient Active Problem List   Diagnosis Date Noted  . Pregnancy 08/09/2014  . GERD (gastroesophageal reflux disease) 05/27/2013    History of present pregnancy: Patient entered care at 15.3 weeks.   EDC of 07/31/2014 was established by Definite LMP of 10/24/2013 and confirmed by 15.3wk US.   Anatomy scan:  19.1 weeks, with normal findings and an anterior placenta.   Additional US evaluations:   -Anatomy: 9 1/7 weeks, EDC 08/02/14, c/w dating, EFW 10 oz, 44%ile, normal fluid, anterior placenta, female. 41.2wks: BPP 8/8 AFI 15%, Vtx Significant prenatal events: 2nd Trimester:Vomiting/Nausea and Heartburn.  Completed fast for Ramadan.  3rd Trimester: Abdominal pain, Stomach aches, Diarrhea, Questionable Cellulitis vs Chemical Burn from henna tattoo Last evaluation:  08/09/2014 by V.Latham--Declined VE--BPP 8/8, AFI 15%ile, VTX---FHR 141--BP 102/60--Wt 192lbs  OB History    Gravida Para Term Preterm AB TAB SAB Ectopic Multiple Living   6 4 4       4     06/2001 Female via [place]VD in [place] 01/2003 Female via SVD in [place] 10/2005 Female via SVD in [place] 07/2007 Female via SVD in [place]  --Reports no issues with previous deliveries or pregnancies    Past Medical History  Diagnosis Date  . Allergy     RHINITIS  . GERD (gastroesophageal reflux disease)    Past Surgical History  Procedure Laterality Date  . Vaginal delivery      X 4   Family History: family history includes Diabetes in her mother. Social History:  reports that she has never smoked. She has never used smokeless tobacco. She reports that she does not drink alcohol or use illicit drugs. 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    Prenatal Transfer Tool  Maternal Diabetes:  No Genetic Screening: Declined Maternal Ultrasounds/Referrals: Normal Fetal Ultrasounds or other Referrals:  None Maternal Substance Abuse:  No Significant Maternal Medications:  Meds include: Zantac Other: Keflex, Diclegis, Zyrtec Significant Maternal Lab Results: Lab values include: Group B Strep positive    ROS:  No complaints at visit this am  Allergies  Allergen Reactions  . Banana Hives  . Fish Oil     rash  . Strawberry     rash       Last menstrual period 10/24/2013, unknown if currently breastfeeding.   FHR: 125 bpm, Mod Var, -Decels, +Accels UCs:  Mild graphed  Prenatal labs: ABO, Rh: A/Positive/-- (06/10 0000) Antibody: Negative (06/10 0000) Rubella:    Immune RPR: Nonreactive (06/10 0000)  HBsAg: Negative (06/10 0000)  HIV: Non-reactive (06/10 0000)  GBS:  Positive Sickle cell/Hgb electrophoresis:  Normal Pap:  Unknown  GC:  Negative Chlamydia:  Negative Genetic screenings:  Declined Glucola:Abnormal--3HR never performed Other:  None    Assessment IUP at 41.2wks Cat I FT Post Dates IOL AMA GBS Positive Female Circumcision  [facility]: [facility] consult with Dr. Dorris CarnesN. Dillard Routine Labor and Delivery Orders per [facility] Protocol Augmentation/Induction Orders per [facility] Protocol Will assess and determine induction method accordingly PCN prophylaxis to be started at initiation of pitocin, ROM, or active labor  [facility] Centers LLCEMLY, Takeo Harts LYNNCNM, MSN 08/09/2014, 7:12 PM

## 2014-08-10 ENCOUNTER — Inpatient Hospital Stay (HOSPITAL_COMMUNITY): Payer: Medicaid Other | Admitting: Anesthesiology

## 2014-08-10 ENCOUNTER — Encounter (HOSPITAL_COMMUNITY): Payer: Self-pay

## 2014-08-10 LAB — ABO/RH: ABO/RH(D): A POS

## 2014-08-10 LAB — TYPE AND SCREEN
ABO/RH(D): A POS
ANTIBODY SCREEN: NEGATIVE

## 2014-08-10 LAB — RPR

## 2014-08-10 MED ORDER — WITCH HAZEL-GLYCERIN EX PADS: 1.0000 "application " | MEDICATED_PAD | CUTANEOUS | Status: DC | PRN

## 2014-08-10 MED ORDER — OXYCODONE-ACETAMINOPHEN 5-325 MG PO TABS: 2.0000 | ORAL_TABLET | ORAL | Status: DC | PRN

## 2014-08-10 MED ORDER — IBUPROFEN 600 MG PO TABS
600.0000 mg | ORAL_TABLET | Freq: Four times a day (QID) | ORAL | Status: DC
  Administered 2014-08-10 – 2014-08-12 (×10): 600 mg via ORAL
  Filled 2014-08-10 (×10): qty 1

## 2014-08-10 MED ORDER — OXYCODONE-ACETAMINOPHEN 5-325 MG PO TABS: 1.0000 | ORAL_TABLET | ORAL | Status: DC | PRN

## 2014-08-10 MED ORDER — PRENATAL MULTIVITAMIN CH
1.0000 | ORAL_TABLET | Freq: Every day | ORAL | Status: DC
  Administered 2014-08-10 – 2014-08-11 (×2): 1 via ORAL
  Filled 2014-08-10 (×2): qty 1

## 2014-08-10 MED ORDER — TETANUS-DIPHTH-ACELL PERTUSSIS 5-2.5-18.5 LF-MCG/0.5 IM SUSP: 0.5000 mL | Freq: Once | INTRAMUSCULAR | Status: DC

## 2014-08-10 MED ORDER — FERROUS SULFATE 325 (65 FE) MG PO TABS
325.0000 mg | ORAL_TABLET | Freq: Two times a day (BID) | ORAL | Status: DC
  Administered 2014-08-10 – 2014-08-12 (×4): 325 mg via ORAL
  Filled 2014-08-10 (×4): qty 1

## 2014-08-10 MED ORDER — ZOLPIDEM TARTRATE 5 MG PO TABS: 5.0000 mg | ORAL_TABLET | Freq: Every evening | ORAL | Status: DC | PRN

## 2014-08-10 MED ORDER — LANOLIN HYDROUS EX OINT: TOPICAL_OINTMENT | CUTANEOUS | Status: DC | PRN

## 2014-08-10 MED ORDER — DIPHENHYDRAMINE HCL 25 MG PO CAPS: 25.0000 mg | ORAL_CAPSULE | Freq: Four times a day (QID) | ORAL | Status: DC | PRN

## 2014-08-10 MED ORDER — SENNOSIDES-DOCUSATE SODIUM 8.6-50 MG PO TABS
2.0000 | ORAL_TABLET | ORAL | Status: DC
  Administered 2014-08-10 – 2014-08-12 (×2): 2 via ORAL
  Filled 2014-08-10 (×2): qty 2

## 2014-08-10 MED ORDER — FENTANYL 2.5 MCG/ML BUPIVACAINE 1/10 % EPIDURAL INFUSION (WH - ANES)
INTRAMUSCULAR | Status: DC | PRN
  Administered 2014-08-10: 14 mL/h via EPIDURAL

## 2014-08-10 MED ORDER — ONDANSETRON HCL 4 MG PO TABS: 4.0000 mg | ORAL_TABLET | ORAL | Status: DC | PRN

## 2014-08-10 MED ORDER — ONDANSETRON HCL 4 MG/2ML IJ SOLN: 4.0000 mg | INTRAMUSCULAR | Status: DC | PRN

## 2014-08-10 MED ORDER — BENZOCAINE-MENTHOL 20-0.5 % EX AERO
1.0000 "application " | INHALATION_SPRAY | CUTANEOUS | Status: DC | PRN
  Administered 2014-08-10: 1 via TOPICAL
  Filled 2014-08-10: qty 56

## 2014-08-10 MED ORDER — DIBUCAINE 1 % RE OINT: 1.0000 "application " | TOPICAL_OINTMENT | RECTAL | Status: DC | PRN

## 2014-08-10 MED ORDER — LIDOCAINE HCL (PF) 1 % IJ SOLN
INTRAMUSCULAR | Status: DC | PRN
  Administered 2014-08-10 (×2): 9 mL

## 2014-08-10 MED ORDER — SIMETHICONE 80 MG PO CHEW: 80.0000 mg | CHEWABLE_TABLET | ORAL | Status: DC | PRN

## 2014-08-10 NOTE — Plan of Care (Signed)
Problem: Phase I Progression Outcomes Goal: Initial discharge plan identified Outcome: Completed/Met Date Met:  08/10/14

## 2014-08-10 NOTE — Plan of Care (Signed)
Problem: Phase II Progression Outcomes Goal: Tolerating diet Outcome: Completed/Met Date Met:  08/10/14

## 2014-08-10 NOTE — Progress Notes (Signed)
Denise Arias Chap   Subjective: Post Partum Day 0 Vaginal delivery, labial laceration Patient up ad lib, denies syncope or dizziness. Reports consuming regular diet without issues and denies N/V No issues with urination and reports bleeding is appropriate  Feeding:  Breast/bottle Contraceptive plan:   unsure  Objective: Temp:  [97.8 F (36.6 C)-98.5 F (36.9 C)] 98.4 F (36.9 C) (12/09 0510) Pulse Rate:  [59-116] 70 (12/09 0510) Resp:  [16-20] 18 (12/09 0510) BP: (94-143)/(45-89) 105/51 mmHg (12/09 0510) SpO2:  [100 %] 100 % (12/09 0510) Weight:  [191 lb (86.637 kg)] 191 lb (86.637 kg) (12/08 1937)  Physical Exam:  General: alert and cooperative Ext: WNL, no edema. No evidence of DVT seen on physical exam. Breast: Soft filling Lungs: CTAB Heart RRR without murmur  Abdomen:  Soft, fundus firm, lochia scant, + bowel sounds, non distended, non tender Lochia: appropriate Uterine Fundus: firm Laceration: healing well    Recent Labs  08/09/14 1935  HGB 10.9*  HCT 31.9*    Assessment S/P Vaginal Delivery-Day 0 Stable  Normal Involution Breastfeeding/Bottlefeeding Iron supplements  Plan: Continue current care Breastfeeding Lactation support   Latrell Reitan, CNM, MSN 08/10/2014, 10:30 AM

## 2014-08-10 NOTE — Plan of Care (Signed)
Problem: Discharge Progression Outcomes Goal: Tolerating diet Outcome: Completed/Met Date Met:  08/10/14

## 2014-08-10 NOTE — Anesthesia Postprocedure Evaluation (Signed)
  Anesthesia Post-op Note  Patient: Denise Arias  Procedure(s) Performed: * No procedures listed *  Patient Location: Mother/Baby  Anesthesia Type:Epidural  Level of Consciousness: awake, alert , oriented and patient cooperative  Airway and Oxygen Therapy: Patient Spontanous Breathing  Post-op Pain: mild  Post-op Assessment: Post-op Vital signs reviewed, Patient's Cardiovascular Status Stable, Respiratory Function Stable, Patent Airway, No headache, No backache, No residual numbness and No residual motor weakness  Post-op Vital Signs: Reviewed and stable  Last Vitals:  Filed Vitals:   08/10/14 0510  BP: 105/51  Pulse: 70  Temp: 36.9 C  Resp: 18    Complications: No apparent anesthesia complications

## 2014-08-10 NOTE — Progress Notes (Signed)
UR chart review completed.  

## 2014-08-10 NOTE — Plan of Care (Signed)
Problem: Phase I Progression Outcomes Goal: Voiding adequately Outcome: Completed/Met Date Met:  08/10/14 Goal: OOB as tolerated unless otherwise ordered Outcome: Completed/Met Date Met:  08/10/14  Problem: Phase II Progression Outcomes Goal: Pain controlled on oral analgesia Outcome: Completed/Met Date Met:  08/10/14 Goal: Progress activity as tolerated unless otherwise ordered Outcome: Completed/Met Date Met:  08/10/14 Goal: Afebrile, VS remain stable Outcome: Completed/Met Date Met:  08/10/14 Goal: Rh isoimmunization per orders Outcome: Not Applicable Date Met:  83/72/90 Goal: Other Phase II Outcomes/Goals Outcome: Completed/Met Date Met:  08/10/14

## 2014-08-10 NOTE — Anesthesia Procedure Notes (Signed)
Epidural Patient location during procedure: OB Start time: 08/10/2014 12:07 AM End time: 08/10/2014 12:11 AM  Staffing Anesthesiologist: Leilani AbleHATCHETT, Tally Mattox  Preanesthetic Checklist Completed: patient identified, surgical consent, pre-op evaluation, timeout performed, IV checked, risks and benefits discussed and monitors and equipment checked  Epidural Patient position: sitting Prep: site prepped and draped and DuraPrep Patient monitoring: continuous pulse ox and blood pressure Approach: midline Location: L3-L4 Injection technique: LOR air  Needle:  Needle type: Tuohy  Needle gauge: 17 G Needle length: 9 cm and 9 Needle insertion depth: 5 cm cm Catheter type: closed end flexible Catheter size: 19 Gauge Catheter at skin depth: 10 cm Test dose: negative and Other  Assessment Sensory level: T9 Events: blood not aspirated, injection not painful, no injection resistance, negative IV test and no paresthesia  Additional Notes Reason for block:procedure for pain

## 2014-08-10 NOTE — Anesthesia Preprocedure Evaluation (Signed)
Anesthesia Evaluation  Patient identified by MRN, date of birth, ID band Patient awake    Reviewed: Allergy & Precautions, H&P , NPO status , Patient's Chart, lab work & pertinent test results  Airway Mallampati: I  TM Distance: >3 FB Neck ROM: full    Dental no notable dental hx.    Pulmonary neg pulmonary ROS,    Pulmonary exam normal       Cardiovascular negative cardio ROS      Neuro/Psych negative neurological ROS  negative psych ROS   GI/Hepatic Neg liver ROS,   Endo/Other  negative endocrine ROS  Renal/GU negative Renal ROS     Musculoskeletal   Abdominal (+) + obese,   Peds  Hematology negative hematology ROS (+)   Anesthesia Other Findings   Reproductive/Obstetrics (+) Pregnancy                             Anesthesia Physical Anesthesia Plan  ASA: II  Anesthesia Plan: Epidural   Post-op Pain Management:    Induction:   Airway Management Planned:   Additional Equipment:   Intra-op Plan:   Post-operative Plan:   Informed Consent: I have reviewed the patients History and Physical, chart, labs and discussed the procedure including the risks, benefits and alternatives for the proposed anesthesia with the patient or authorized representative who has indicated his/her understanding and acceptance.     Plan Discussed with:   Anesthesia Plan Comments:         Anesthesia Quick Evaluation

## 2014-08-10 NOTE — Progress Notes (Signed)
Denise Arias MRN: [id number]  Subjective: -Patient comfortable after epidural, but now reporting intermittent rectal pressure.   Objective: BP 127/51 mmHg  Pulse 80  Temp(Src) 97.8 F (36.6 C) (Oral)  Resp 18  Ht 5\' 4"  (1.626 m)  Wt 191 lb (86.637 kg)  BMI 32.77 kg/m2  SpO2 100%  LMP 10/24/2013     FHT: 145 bpm, Mod Var, + Late Decels, +Accels UC:   Q1-332min, palpates moderate SVE:   Dilation: 10 Effacement (%): 100 Station: -1 Exam by:: Deontra Pereyra CNM Membranes: SROM noted, patient unsure of when it occurred.  Light Meconium Pitocin: 315mUn/min  Assessment:  IUP at 41.3wks Cat II FT  Second Stage Labor  Plan: -Position change to high fowler -Will return when rectal pressure constant -Continue other mgmt as ordered -Anticipate SVD  Denise Borrelli LYNN,MSN, CNM 08/10/2014, 00:48 AM

## 2014-08-10 NOTE — Plan of Care (Signed)
Problem: Phase I Progression Outcomes Goal: VS, stable, temp < 100.4 degrees F Outcome: Completed/Met Date Met:  08/10/14

## 2014-08-10 NOTE — Plan of Care (Signed)
Problem: Phase I Progression Outcomes Goal: Other Phase I Outcomes/Goals Outcome: Completed/Met Date Met:  08/10/14     

## 2014-08-10 NOTE — Plan of Care (Signed)
Problem: Consults Goal: Postpartum Patient Education (See Patient Education module for education specifics.) Outcome: Completed/Met Date Met:  08/10/14 Goal: Skin Care Protocol Initiated - if Braden Score 18 or less If consults are not indicated, leave blank or document N/A Outcome: Completed/Met Date Met:  08/10/14  Problem: Phase I Progression Outcomes Goal: Pain controlled with appropriate interventions Outcome: Completed/Met Date Met:  08/10/14

## 2014-08-10 NOTE — Lactation Note (Signed)
This note was copied from the chart of Denise Arias [place]. Lactation Consultation Note  Patient Name: Denise Arias Lung[place] ZOXWR'UToday's Date: 08/10/2014 Reason for consult: Initial assessment  Visited with Mom, baby 15 hrs old.  Baby sleeping at present in her crib.  Mom shared that she prefers to offer formula until her milk comes in.  She fed her 4 other children up to 1 1/2 years and did very well.  Encouraged Mom to exclusively breast feed with explanation on benefits.  Mom has given baby 3 bottles of formula (5-8 ml), in addition to latching baby.  Mom denies needing any help with latching.  Encouraged continued skin to skin, and feeding baby on cue.   Brochure left in room.  Explained about IP and OP lactation services available to her.  Encouraged her to call for help prn.  Follow up prn.  Consult Status Consult Status: Follow-up Date: 08/11/14 Follow-up type: In-patient    Judee ClaraSmith, Tiffany Talarico E 08/10/2014, 5:09 PM

## 2014-08-11 LAB — CBC
HCT: 28.4 % — ABNORMAL LOW (ref 36.0–46.0)
Hemoglobin: 9.7 g/dL — ABNORMAL LOW (ref 12.0–15.0)
MCH: 29.8 pg (ref 26.0–34.0)
MCHC: 34.2 g/dL (ref 30.0–36.0)
MCV: 87.4 fL (ref 78.0–100.0)
PLATELETS: 168 10*3/uL (ref 150–400)
RBC: 3.25 MIL/uL — ABNORMAL LOW (ref 3.87–5.11)
RDW: 13.7 % (ref 11.5–15.5)
WBC: 9.1 10*3/uL (ref 4.0–10.5)

## 2014-08-11 NOTE — Progress Notes (Signed)
Subjective: Postpartum Day 1: Vaginal delivery, left labial laceration Patient up ad lib, reports no syncope or dizziness. Feeding:  Breast Contraceptive plan:  Undecided at present  Objective: Vital signs in last 24 hours: Temp:  [97.6 F (36.4 C)-98.2 F (36.8 C)] 97.9 F (36.6 C) (12/10 0550) Pulse Rate:  [58-62] 58 (12/10 0550) Resp:  [17-18] 18 (12/10 0550) BP: (97-108)/(49-68) 97/57 mmHg (12/10 0550)  Physical Exam:  General: alert Lochia: appropriate Uterine Fundus: firm Perineum: healing well DVT Evaluation: No evidence of DVT seen on physical exam. Negative Homan's sign.    Recent Labs  08/09/14 1935 08/11/14 0635  HGB 10.9* 9.7*  HCT 31.9* 28.4*    Assessment/Plan: Status post vaginal delivery day 1. Stable Continue current care. Plan for discharge tomorrow    Denise Arias, Denise Arias 08/11/2014, 12:48 PM

## 2014-08-11 NOTE — Lactation Note (Signed)
This note was copied from the chart of Denise Arias [place]. Lactation Consultation Note  Sister holding infant with pacifier.  Reviewed reasons to wait on pacifier. Mother has been primarily formula feeding.  Reviewed how to breastfeed with phototherapy. Encouraged mother to breastfeed often to establish her milk supply. Mother states she is hungry and she has very little breastmilk so she has been formula feeding. Reviewed supply and demand. Patient Name: Denise Arias [place] ZOXWR'UToday's Date: 08/11/2014     Maternal Data    Feeding Feeding Type: Formula  LATCH Score/Interventions                      Lactation Tools Discussed/Used     Consult Status      Dahlia ByesBerkelhammer, Ruth Boschen 08/11/2014, 8:02 PM

## 2014-08-11 NOTE — Plan of Care (Signed)
Problem: Discharge Progression Outcomes Goal: Pain controlled with appropriate interventions Outcome: Completed/Met Date Met:  08/11/14     

## 2014-08-12 ENCOUNTER — Ambulatory Visit: Payer: Self-pay

## 2014-08-12 MED ORDER — FERROUS SULFATE 325 (65 FE) MG PO TABS: 325.0000 mg | ORAL_TABLET | Freq: Two times a day (BID) | ORAL | Status: DC

## 2014-08-12 MED ORDER — OXYCODONE-ACETAMINOPHEN 5-325 MG PO TABS: 1.0000 | ORAL_TABLET | ORAL | Status: DC | PRN

## 2014-08-12 MED ORDER — IBUPROFEN 600 MG PO TABS: 600.0000 mg | ORAL_TABLET | Freq: Four times a day (QID) | ORAL | Status: DC

## 2014-08-12 NOTE — Lactation Note (Addendum)
This note was copied from the chart of Denise Arias. Lactation Consultation Note  Patient Name: Denise Christain SacramentoRazaz Badawi Dufford ZOXWR'UToday's Date: 08/12/2014 Reason for consult: Follow-up assessment Baby 59 hours of life. Mom reports she is mainly giving bottles right now because she "does not have enough colostrum." Mom states that she fed her other 4 children this way, formula and breast milk. Mom states that she is placing baby to breast first, then supplementing with formula. Mom states that baby will be going home tomorrow. Enc mom to call out for assistance as needed.   Maternal Data    Feeding Feeding Type:  (Mom bottle-feeding now. )  LATCH Score/Interventions                      Lactation Tools Discussed/Used     Consult Status Consult Status: PRN    Geralynn OchsWILLIARD, Javanna Patin 08/12/2014, 1:44 PM

## 2014-08-12 NOTE — Discharge Summary (Signed)
Vaginal Delivery Discharge Summary  ALL information will be verified prior to discharge  Denise Arias  DOB:    01/03/1981 MRN:    [id number] CSN:    [id number]  Date of admission:                  08/09/14  Date of discharge:                   08/12/14  Procedures this admission: SVD  Date of Delivery: 08/10/14  Newborn Data:  Live born  Information for the patient's newborn:  Amiaya Mcneeley, Girl Leylanie [[id number]]  female  APGAR ,   Live born female  Birth Weight: 7 lb 11.3 oz (3495 g) APGAR: 8, 9  Weight    Baby on Bili light, Peds will decide on DC Name: Denise Arias  History of Present Illness: Ms. Denise Arias is a 33 y.o. female, G5P5001, who presents at [redacted]w[redacted]d weeks gestation. The patient has been followed at the [facility] and Gynecology division of [facility]. She was admitted induction of labor. Her pregnancy has been complicated by:  Patient Active Problem List   Diagnosis Date Noted  . SVD (spontaneous vaginal delivery) 08/10/2014  . Obstetric labial laceration, delivered, current hospitalization 08/10/2014  . Pregnancy 08/09/2014  . 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 GERD (gastroesophageal reflux disease) 05/27/2013    Hospital course: The patient was admitted for IOL for PD.   Her labor was not complicated. She proceeded to have a vaginal delivery of a healthy infant. Her delivery was not complicated. Her postpartum course was not complicated. She was discharged to home on postpartum day 2 doing well.  Feeding: breast and bottle  Contraception: unsure Pt understands the risks birth control are not limited to irregular bleeding, formation of DVT, fluid fluctuations, elevation in blood pressure, stroke, breast tenderness and liver damage.  She states she will report any serious side effects.  She has been given verbal and written instructions and voiced a clear understanding.    Discharge  hemoglobin: HEMOGLOBIN  Date Value Ref Range Status  08/11/2014 9.7* 12.0 - 15.0 g/dL Final   HCT  Date Value Ref Range Status  08/11/2014 28.4* 36.0 - 46.0 % Final    PreNatal Labs ABO, Rh: --/--/A POS, A POS (12/08 1935)   Antibody: NEG (12/08 1935) Rubella:   immune RPR: NON REAC (12/08 1935)  HBsAg: Negative (06/10 0000)  HIV: Non-reactive (06/10 0000)  GBS: Positive (12/01 0000)  Discharge Physical Exam:  General: alert and cooperative Lochia: appropriate Uterine Fundus: firm Incision: healing well DVT Evaluation: No evidence of DVT seen on physical exam.  Intrapartum Procedures: spontaneous vaginal delivery and GBS prophylaxis Postpartum Procedures: none Complications-Operative and Postpartum: none  Discharge Diagnoses: Post-date pregnancy, anemic  Activity:           pelvic rest Diet:                routine Medications: PNV, Ibuprofen, Iron and Percocet Condition:      stable     Postpartum Teaching: Nutrition, exercise, return to work or school, family visits, sexual activity, home rest, vaginal bleeding, pelvic rest, family planning, s/s of PPD, breast care peri-care and incision care   Discharge to: home  Follow-up Information    Follow up with [facility]. Schedule an appointment as soon as possible for a visit in 6 weeks.   Specialty:  Obstetrics and Gynecology   Why:  Postpartum check up   Contact information:   [address] [place] [address] [phone]       Jenalyn Girdner, CNM, MSN 08/12/2014. 10:45 AM   Postpartum Care After Vaginal Delivery  After you deliver your newborn (postpartum period), the usual stay in the hospital is 24 72 hours. If there were problems with your labor or delivery, or if you have other medical problems, you might be in the hospital longer.  While you are in the hospital, you will receive help and instructions on how to care for yourself and your  newborn during the postpartum period.  While you are in the hospital:  Be sure to tell your nurses if you have pain or discomfort, as well as where you feel the pain and what makes the pain worse.  If you had an incision made near your vagina (episiotomy) or if you had some tearing during delivery, the nurses may put ice packs on your episiotomy or tear. The ice packs may help to reduce the pain and swelling.  If you are breastfeeding, you may feel uncomfortable contractions of your uterus for a couple of weeks. This is normal. The contractions help your uterus get back to normal size.  It is normal to have some bleeding after delivery.  For the first 1 3 days after delivery, the flow is red and the amount may be similar to a period.  It is common for the flow to start and stop.  In the first few days, you may pass some small clots. Let your nurses know if you begin to pass large clots or your flow increases.  Do not  flush blood clots down the toilet before having the nurse look at them.  During the next 3 10 days after delivery, your flow should become more watery and pink or brown-tinged in color.  Ten to fourteen days after delivery, your flow should be a small amount of yellowish-white discharge.  The amount of your flow will decrease over the first few weeks after delivery. Your flow may stop in 6 8 weeks. Most women have had their flow stop by 12 weeks after delivery.  You should change your sanitary pads frequently.  Wash your hands thoroughly with soap and water for at least 20 seconds after changing pads, using the toilet, or before holding or feeding your newborn.  You should feel like you need to empty your bladder within the first 6 8 hours after delivery.  In case you become weak, lightheaded, or faint, call your nurse before you get out of bed for the first time and before you take a shower for the first time.  Within the first few days after delivery, your breasts may  begin to feel tender and full. This is called engorgement. Breast tenderness usually goes away within 48 72 hours after engorgement occurs. You may also notice milk leaking from your breasts. If you are not breastfeeding, do not stimulate your breasts. Breast stimulation can make your breasts produce more milk.  Spending as much time as possible with your newborn is very important. During this time, you and your newborn can feel close and get to know each other. Having your newborn stay in your room (rooming in) will help to strengthen the bond with your newborn. It will give you time to get to know your newborn and become comfortable caring for your newborn.  Your hormones change after delivery. Sometimes the hormone changes can temporarily cause you  to feel sad or tearful. These feelings should not last more than a few days. If these feelings last longer than that, you should talk to your caregiver.  If desired, talk to your caregiver about methods of family planning or contraception.  Talk to your caregiver about immunizations. Your caregiver may want you to have the following immunizations before leaving the hospital:  Tetanus, diphtheria, and pertussis (Tdap) or tetanus and diphtheria (Td) immunization. It is very important that you and your family (including grandparents) or others caring for your newborn are up-to-date with the Tdap or Td immunizations. The Tdap or Td immunization can help protect your newborn from getting ill.  Rubella immunization.  Varicella (chickenpox) immunization.  Influenza immunization. You should receive this annual immunization if you did not receive the immunization during your pregnancy. Document Released: 06/16/2007 Document Revised: 05/13/2012 Document Reviewed: 04/15/2012 [facility] Patient Information 2014 [employer] [place].   Postpartum Depression and Baby Blues  The postpartum period begins right after the birth of a baby. During this time, there is  often a great amount of joy and excitement. It is also a time of considerable changes in the life of the parent(s). Regardless of how many times a mother gives birth, each child brings new challenges and dynamics to the family. It is not unusual to have feelings of excitement accompanied by confusing shifts in moods, emotions, and thoughts. All mothers are at risk of developing postpartum depression or the "baby blues." These mood changes can occur right after giving birth, or they may occur many months after giving birth. The baby blues or postpartum depression can be mild or severe. Additionally, postpartum depression can resolve rather quickly, or it can be a long-term condition. CAUSES Elevated hormones and their rapid decline are thought to be a main cause of postpartum depression and the baby blues. There are a number of hormones that radically change during and after pregnancy. Estrogen and progesterone usually decrease immediately after delivering your baby. The level of thyroid hormone and various cortisol steroids also rapidly drop. Other factors that play a major role in these changes include major life events and genetics.  RISK FACTORS If you have any of the following risks for the baby blues or postpartum depression, know what symptoms to watch out for during the postpartum period. Risk factors that may increase the likelihood of getting the baby blues or postpartum depression include: 1. Havinga personal or family history of depression. 2. Having depression while being pregnant. 3. Having premenstrual or oral contraceptive-associated mood issues. 4. Having exceptional life stress. 5. Having marital conflict. 6. Lacking a social support network. 7. Having a baby with special needs. 8. Having health problems such as diabetes. SYMPTOMS Baby blues symptoms include:  Brief fluctuations in mood, such as going from extreme happiness to sadness.  Decreased concentration.  Difficulty  sleeping.  Crying spells, tearfulness.  Irritability.  Anxiety. Postpartum depression symptoms typically begin within the first month after giving birth. These symptoms include:  Difficulty sleeping or excessive sleepiness.  Marked weight loss.  Agitation.  Feelings of worthlessness.  Lack of interest in activity or food. Postpartum psychosis is a very concerning condition and can be dangerous. Fortunately, it is rare. Displaying any of the following symptoms is cause for immediate medical attention. Postpartum psychosis symptoms include:  Hallucinations and delusions.  Bizarre or disorganized behavior.  Confusion or disorientation. DIAGNOSIS  A diagnosis is made by an evaluation of your symptoms. There are no medical or lab tests that lead to  a diagnosis, but there are various questionnaires that a caregiver may use to identify those with the baby blues, postpartum depression, or psychosis. Often times, a screening tool called the [place] Postnatal Depression Scale is used to diagnose depression in the postpartum period.  TREATMENT The baby blues usually goes away on its own in 1 to 2 weeks. Social support is often all that is needed. You should be encouraged to get adequate sleep and rest. Occasionally, you may be given medicines to help you sleep.  Postpartum depression requires treatment as it can last several months or longer if it is not treated. Treatment may include individual or group therapy, medicine, or both to address any social, physiological, and psychological factors that may play a role in the depression. Regular exercise, a healthy diet, rest, and social support may also be strongly recommended.  Postpartum psychosis is more serious and needs treatment right away. Hospitalization is often needed. HOME CARE INSTRUCTIONS  Get as much rest as you can. Nap when the baby sleeps.  Exercise regularly. Some women find yoga and walking to be beneficial.  Eat a balanced  and nourishing diet.  Do little things that you enjoy. Have a cup of tea, take a bubble bath, read your favorite magazine, or listen to your favorite music.  Avoid alcohol.  Ask for help with household chores, cooking, grocery shopping, or running errands as needed. Do not try to do everything.  Talk to people close to you about how you are feeling. Get support from your partner, family members, friends, or other new moms.  Try to stay positive in how you think. Think about the things you are grateful for.  Do not spend a lot of time alone.  Only take medicine as directed by your caregiver.  Keep all your postpartum appointments.  Let your caregiver know if you have any concerns. SEEK MEDICAL CARE IF: You are having a reaction or problems with your medicine. SEEK IMMEDIATE MEDICAL CARE IF:  You have suicidal feelings.  You feel you may harm the baby or someone else. Document Released: 05/23/2004 Document Revised: 11/11/2011 Document Reviewed: 06/25/2011 [facility] Patient Information 2014 [place], [place].     Breastfeeding Deciding to breastfeed is one of the best choices you can make for you and your baby. A change in hormones during pregnancy causes your breast tissue to grow and increases the number and size of your milk ducts. These hormones also allow proteins, sugars, and fats from your blood supply to make breast milk in your milk-producing glands. Hormones prevent breast milk from being released before your baby is born as well as prompt milk flow after birth. Once breastfeeding has begun, thoughts of your baby, as well as his or her sucking or crying, can stimulate the release of milk from your milk-producing glands.  BENEFITS OF BREASTFEEDING For Your Baby  Your first milk (colostrum) helps your baby's digestive system function better.   There are antibodies in your milk that help your baby fight off infections.   Your baby has a lower incidence of asthma,  allergies, and sudden infant death syndrome.   The nutrients in breast milk are better for your baby than infant formulas and are designed uniquely for your baby's needs.   Breast milk improves your baby's brain development.   Your baby is less likely to develop other conditions, such as childhood obesity, asthma, or type 2 diabetes mellitus.  For You   Breastfeeding helps to create a very special bond between  you and your baby.   Breastfeeding is convenient. Breast milk is always available at the correct temperature and costs nothing.   Breastfeeding helps to burn calories and helps you lose the weight gained during pregnancy.   Breastfeeding makes your uterus contract to its prepregnancy size faster and slows bleeding (lochia) after you give birth.   Breastfeeding helps to lower your risk of developing type 2 diabetes mellitus, osteoporosis, and breast or ovarian cancer later in life. SIGNS THAT YOUR BABY IS HUNGRY Early Signs of Hunger  Increased alertness or activity.  Stretching.  Movement of the head from side to side.  Movement of the head and opening of the mouth when the corner of the mouth or cheek is stroked (rooting).  Increased sucking sounds, smacking lips, cooing, sighing, or squeaking.  Hand-to-mouth movements.  Increased sucking of fingers or hands. Late Signs of Hunger  Fussing.  Intermittent crying. Extreme Signs of Hunger Signs of extreme hunger will require calming and consoling before your baby will be able to breastfeed successfully. Do not wait for the following signs of extreme hunger to occur before you initiate breastfeeding:   Restlessness.  A loud, strong cry.   Screaming.   BREASTFEEDING BASICS Breastfeeding Initiation  Find a comfortable place to sit or lie down, with your neck and back well supported.  Place a pillow or rolled up blanket under your baby to bring him or her to the level of your breast (if you are seated).  Nursing pillows are specially designed to help support your arms and your baby while you breastfeed.  Make sure that your baby's abdomen is facing your abdomen.   Gently massage your breast. With your fingertips, massage from your chest wall toward your nipple in a circular motion. This encourages milk flow. You may need to continue this action during the feeding if your milk flows slowly.  Support your breast with 4 fingers underneath and your thumb above your nipple. Make sure your fingers are well away from your nipple and your baby's mouth.   Stroke your baby's lips gently with your finger or nipple.   When your baby's mouth is open wide enough, quickly bring your baby to your breast, placing your entire nipple and as much of the colored area around your nipple (areola) as possible into your baby's mouth.   More areola should be visible above your baby's upper lip than below the lower lip.   Your baby's tongue should be between his or her lower gum and your breast.   Ensure that your baby's mouth is correctly positioned around your nipple (latched). Your baby's lips should create a seal on your breast and be turned out (everted).  It is common for your baby to suck about 2-3 minutes in order to start the flow of breast milk. Latching Teaching your baby how to latch on to your breast properly is very important. An improper latch can cause nipple pain and decreased milk supply for you and poor weight gain in your baby. Also, if your baby is not latched onto your nipple properly, he or she may swallow some air during feeding. This can make your baby fussy. Burping your baby when you switch breasts during the feeding can help to get rid of the air. However, teaching your baby to latch on properly is still the best way to prevent fussiness from swallowing air while breastfeeding. Signs that your baby has successfully latched on to your nipple:    Silent tugging or  silent sucking, without  causing you pain.   Swallowing heard between every 3-4 sucks.    Muscle movement above and in front of his or her ears while sucking.  Signs that your baby has not successfully latched on to nipple:   Sucking sounds or smacking sounds from your baby while breastfeeding.  Nipple pain. If you think your baby has not latched on correctly, slip your finger into the corner of your baby's mouth to break the suction and place it between your baby's gums. Attempt breastfeeding initiation again. Signs of Successful Breastfeeding Signs from your baby:   A gradual decrease in the number of sucks or complete cessation of sucking.   Falling asleep.   Relaxation of his or her body.   Retention of a small amount of milk in his or her mouth.   Letting go of your breast by himself or herself. Signs from you:  Breasts that have increased in firmness, weight, and size 1-3 hours after feeding.   Breasts that are softer immediately after breastfeeding.  Increased milk volume, as well as a change in milk consistency and color by the fifth day of breastfeeding.   Nipples that are not sore, cracked, or bleeding. Signs That Your Pecola LeisureBaby is Getting Enough Milk  Wetting at least 3 diapers in a 24-hour period. The urine should be clear and pale yellow by age 38 days.  At least 3 stools in a 24-hour period by age 38 days. The stool should be soft and yellow.  At least 3 stools in a 24-hour period by age 72 days. The stool should be seedy and yellow.  No loss of weight greater than 10% of birth weight during the first 763 days of age.  Average weight gain of 4-7 ounces (113-198 g) per week after age 19 days.  Consistent daily weight gain by age 38 days, without weight loss after the age of 2 weeks. After a feeding, your baby may spit up a small amount. This is common. BREASTFEEDING FREQUENCY AND DURATION Frequent feeding will help you make more milk and can prevent sore nipples and breast  engorgement. Breastfeed when you feel the need to reduce the fullness of your breasts or when your baby shows signs of hunger. This is called "breastfeeding on demand." Avoid introducing a pacifier to your baby while you are working to establish breastfeeding (the first 4-6 weeks after your baby is born). After this time you may choose to use a pacifier. Research has shown that pacifier use during the first year of a baby's life decreases the risk of sudden infant death syndrome (SIDS). Allow your baby to feed on each breast as long as he or she wants. Breastfeed until your baby is finished feeding. When your baby unlatches or falls asleep while feeding from the first breast, offer the second breast. Because newborns are often sleepy in the first few weeks of life, you may need to awaken your baby to get him or her to feed. Breastfeeding times will vary from baby to baby. However, the following rules can serve as a guide to help you ensure that your baby is properly fed:  Newborns (babies 184 weeks of age or younger) may breastfeed every 1-3 hours.  Newborns should not go longer than 3 hours during the day or 5 hours during the night without breastfeeding.  You should breastfeed your baby a minimum of 8 times in a 24-hour period until you begin to introduce solid foods to your baby at  around 6 months of age. BREAST MILK PUMPING Pumping and storing breast milk allows you to ensure that your baby is exclusively fed your breast milk, even at times when you are unable to breastfeed. This is especially important if you are going back to work while you are still breastfeeding or when you are not able to be present during feedings. Your lactation consultant can give you guidelines on how long it is safe to store breast milk.  A breast pump is a machine that allows you to pump milk from your breast into a sterile bottle. The pumped breast milk can then be stored in a refrigerator or freezer. Some breast pumps are  operated by hand, while others use electricity. Ask your lactation consultant which type will work best for you. Breast pumps can be purchased, but some hospitals and breastfeeding support groups lease breast pumps on a monthly basis. A lactation consultant can teach you how to hand express breast milk, if you prefer not to use a pump.  CARING FOR YOUR BREASTS WHILE YOU BREASTFEED Nipples can become dry, cracked, and sore while breastfeeding. The following recommendations can help keep your breasts moisturized and healthy:  Avoid using soap on your nipples.   Wear a supportive bra. Although not required, special nursing bras and tank tops are designed to allow access to your breasts for breastfeeding without taking off your entire bra or top. Avoid wearing underwire-style bras or extremely tight bras.  Air dry your nipples for 3-60minutes after each feeding.   Use only cotton bra pads to absorb leaked breast milk. Leaking of breast milk between feedings is normal.   Use lanolin on your nipples after breastfeeding. Lanolin helps to maintain your skin's normal moisture barrier. If you use pure lanolin, you do not need to wash it off before feeding your baby again. Pure lanolin is not toxic to your baby. You may also hand express a few drops of breast milk and gently massage that milk into your nipples and allow the milk to air dry. In the first few weeks after giving birth, some women experience extremely full breasts (engorgement). Engorgement can make your breasts feel heavy, warm, and tender to the touch. Engorgement peaks within 3-5 days after you give birth. The following recommendations can help ease engorgement:  Completely empty your breasts while breastfeeding or pumping. You may want to start by applying warm, moist heat (in the shower or with warm water-soaked hand towels) just before feeding or pumping. This increases circulation and helps the milk flow. If your baby does not completely  empty your breasts while breastfeeding, pump any extra milk after he or she is finished.  Wear a snug bra (nursing or regular) or tank top for 1-2 days to signal your body to slightly decrease milk production.  Apply ice packs to your breasts, unless this is too uncomfortable for you.  Make sure that your baby is latched on and positioned properly while breastfeeding. If engorgement persists after 48 hours of following these recommendations, contact your health care provider or a Advertising copywriter. OVERALL HEALTH CARE RECOMMENDATIONS WHILE BREASTFEEDING  Eat healthy foods. Alternate between meals and snacks, eating 3 of each per day. Because what you eat affects your breast milk, some of the foods may make your baby more irritable than usual. Avoid eating these foods if you are sure that they are negatively affecting your baby.  Drink milk, fruit juice, and water to satisfy your thirst (about 10 glasses a day).  Rest often, relax, and continue to take your prenatal vitamins to prevent fatigue, stress, and anemia.  Continue breast self-awareness checks.  Avoid chewing and smoking tobacco.  Avoid alcohol and drug use. Some medicines that may be harmful to your baby can pass through breast milk. It is important to ask your health care provider before taking any medicine, including all over-the-counter and prescription medicine as well as vitamin and herbal supplements. It is possible to become pregnant while breastfeeding. If birth control is desired, ask your health care provider about options that will be safe for your baby. SEEK MEDICAL CARE IF:   You feel like you want to stop breastfeeding or have become frustrated with breastfeeding.  You have painful breasts or nipples.  Your nipples are cracked or bleeding.  Your breasts are red, tender, or warm.  You have a swollen area on either breast.  You have a fever or chills.  You have nausea or vomiting.  You have drainage  other than breast milk from your nipples.  Your breasts do not become full before feedings by the fifth day after you give birth.  You feel sad and depressed.  Your baby is too sleepy to eat well.  Your baby is having trouble sleeping.   Your baby is wetting less than 3 diapers in a 24-hour period.  Your baby has less than 3 stools in a 24-hour period.  Your baby's skin or the white part of his or her eyes becomes yellow.   Your baby is not gaining weight by 34 days of age. SEEK IMMEDIATE MEDICAL CARE IF:   Your baby is overly tired (lethargic) and does not want to wake up and feed.  Your baby develops an unexplained fever. Document Released: 08/19/2005 Document Revised: 08/24/2013 Document Reviewed: 02/10/2013 [facility] Patient Information 2015 [place], [place]. This information is not intended to replace advice given to you by your health care provider. Make sure you discuss any questions you have with your health care provider.

## 2014-08-12 NOTE — Plan of Care (Signed)
Problem: Discharge Progression Outcomes Goal: Barriers To Progression Addressed/Resolved Outcome: Completed/Met Date Met:  08/12/14 Goal: Activity appropriate for discharge plan Outcome: Completed/Met Date Met:  56/81/27 Goal: Complications resolved/controlled Outcome: Completed/Met Date Met:  08/12/14 Goal: Discharge plan in place and appropriate Outcome: Completed/Met Date Met:  08/12/14

## 2014-08-23 ENCOUNTER — Emergency Department (HOSPITAL_COMMUNITY)
Admission: EM | Admit: 2014-08-23 | Discharge: 2014-08-23 | Disposition: A | Discharge status: Home / Self Care | Payer: Medicaid Other | Attending: Emergency Medicine | Admitting: Emergency Medicine

## 2014-08-23 ENCOUNTER — Emergency Department (HOSPITAL_COMMUNITY): Payer: Medicaid Other

## 2014-08-23 ENCOUNTER — Encounter (HOSPITAL_COMMUNITY): Payer: Self-pay | Admitting: Cardiology

## 2014-08-23 ENCOUNTER — Ambulatory Visit (INDEPENDENT_AMBULATORY_CARE_PROVIDER_SITE_OTHER): Payer: Medicaid Other | Admitting: Family Medicine

## 2014-08-23 ENCOUNTER — Encounter: Payer: Self-pay | Admitting: Family Medicine

## 2014-08-23 VITALS — BP 138/80 | HR 65 | Temp 98.1°F

## 2014-08-23 DIAGNOSIS — N390 Urinary tract infection, site not specified: Secondary | ICD-10-CM

## 2014-08-23 DIAGNOSIS — O9989 Other specified diseases and conditions complicating pregnancy, childbirth and the puerperium: Secondary | ICD-10-CM | POA: Diagnosis present

## 2014-08-23 DIAGNOSIS — B9789 Other viral agents as the cause of diseases classified elsewhere: Secondary | ICD-10-CM | POA: Diagnosis not present

## 2014-08-23 DIAGNOSIS — R1011 Right upper quadrant pain: Secondary | ICD-10-CM

## 2014-08-23 DIAGNOSIS — Z8709 Personal history of other diseases of the respiratory system: Secondary | ICD-10-CM | POA: Insufficient documentation

## 2014-08-23 DIAGNOSIS — R52 Pain, unspecified: Secondary | ICD-10-CM

## 2014-08-23 DIAGNOSIS — R0602 Shortness of breath: Secondary | ICD-10-CM

## 2014-08-23 DIAGNOSIS — Z79899 Other long term (current) drug therapy: Secondary | ICD-10-CM | POA: Insufficient documentation

## 2014-08-23 DIAGNOSIS — O9963 Diseases of the digestive system complicating the puerperium: Secondary | ICD-10-CM | POA: Diagnosis not present

## 2014-08-23 DIAGNOSIS — R7989 Other specified abnormal findings of blood chemistry: Secondary | ICD-10-CM | POA: Diagnosis not present

## 2014-08-23 DIAGNOSIS — K8020 Calculus of gallbladder without cholecystitis without obstruction: Secondary | ICD-10-CM | POA: Insufficient documentation

## 2014-08-23 DIAGNOSIS — O8620 Urinary tract infection following delivery, unspecified: Secondary | ICD-10-CM | POA: Diagnosis not present

## 2014-08-23 DIAGNOSIS — R945 Abnormal results of liver function studies: Secondary | ICD-10-CM

## 2014-08-23 LAB — URINALYSIS, ROUTINE W REFLEX MICROSCOPIC
Bilirubin Urine: NEGATIVE
GLUCOSE, UA: NEGATIVE mg/dL
Ketones, ur: NEGATIVE mg/dL
Nitrite: NEGATIVE
PH: 5.5 (ref 5.0–8.0)
Protein, ur: NEGATIVE mg/dL
SPECIFIC GRAVITY, URINE: 1.025 (ref 1.005–1.030)
Urobilinogen, UA: 1 mg/dL (ref 0.0–1.0)

## 2014-08-23 LAB — CBC WITH DIFFERENTIAL/PLATELET
BASOS ABS: 0 10*3/uL (ref 0.0–0.1)
BASOS PCT: 0 % (ref 0–1)
EOS ABS: 0 10*3/uL (ref 0.0–0.7)
Eosinophils Relative: 0 % (ref 0–5)
HEMATOCRIT: 39.7 % (ref 36.0–46.0)
Hemoglobin: 13.4 g/dL (ref 12.0–15.0)
Lymphocytes Relative: 11 % — ABNORMAL LOW (ref 12–46)
Lymphs Abs: 0.9 10*3/uL (ref 0.7–4.0)
MCH: 28.9 pg (ref 26.0–34.0)
MCHC: 33.8 g/dL (ref 30.0–36.0)
MCV: 85.7 fL (ref 78.0–100.0)
MONO ABS: 0.4 10*3/uL (ref 0.1–1.0)
Monocytes Relative: 5 % (ref 3–12)
NEUTROS ABS: 6.7 10*3/uL (ref 1.7–7.7)
Neutrophils Relative %: 84 % — ABNORMAL HIGH (ref 43–77)
Platelets: 274 10*3/uL (ref 150–400)
RBC: 4.63 MIL/uL (ref 3.87–5.11)
RDW: 13.1 % (ref 11.5–15.5)
WBC: 8 10*3/uL (ref 4.0–10.5)

## 2014-08-23 LAB — COMPREHENSIVE METABOLIC PANEL
ALBUMIN: 3.7 g/dL (ref 3.5–5.2)
ALT: 118 U/L — ABNORMAL HIGH (ref 0–35)
AST: 146 U/L — AB (ref 0–37)
Alkaline Phosphatase: 131 U/L — ABNORMAL HIGH (ref 39–117)
Anion gap: 8 (ref 5–15)
BUN: 11 mg/dL (ref 6–23)
CALCIUM: 9.5 mg/dL (ref 8.4–10.5)
CHLORIDE: 105 meq/L (ref 96–112)
CO2: 26 mmol/L (ref 19–32)
CREATININE: 0.58 mg/dL (ref 0.50–1.10)
GFR calc Af Amer: >90 mL/min (ref >90)
GFR calc non Af Amer: >90 mL/min (ref >90)
Glucose, Bld: 106 mg/dL — ABNORMAL HIGH (ref 70–99)
Potassium: 4 mmol/L (ref 3.5–5.1)
Sodium: 139 mmol/L (ref 135–145)
TOTAL PROTEIN: 7.6 g/dL (ref 6.0–8.3)
Total Bilirubin: 1.2 mg/dL (ref 0.3–1.2)

## 2014-08-23 LAB — URINE MICROSCOPIC-ADD ON

## 2014-08-23 MED ORDER — IOHEXOL 350 MG/ML SOLN
100.0000 mL | Freq: Once | INTRAVENOUS | Status: AC | PRN
  Administered 2014-08-23: 100 mL via INTRAVENOUS

## 2014-08-23 MED ORDER — MORPHINE SULFATE 4 MG/ML IJ SOLN
4.0000 mg | Freq: Once | INTRAMUSCULAR | Status: AC
  Administered 2014-08-23: 4 mg via INTRAVENOUS

## 2014-08-23 MED ORDER — HYDROCODONE-ACETAMINOPHEN 5-325 MG PO TABS: 1.0000 | ORAL_TABLET | ORAL | Status: DC | PRN

## 2014-08-23 MED ORDER — ONDANSETRON HCL 4 MG/2ML IJ SOLN
4.0000 mg | Freq: Once | INTRAMUSCULAR | Status: AC
  Administered 2014-08-23: 4 mg via INTRAVENOUS
  Filled 2014-08-23: qty 2

## 2014-08-23 MED ORDER — FENTANYL CITRATE 0.05 MG/ML IJ SOLN: 50.0000 ug | Freq: Once | INTRAMUSCULAR | Status: DC

## 2014-08-23 MED ORDER — MORPHINE SULFATE 4 MG/ML IJ SOLN
4.0000 mg | Freq: Once | INTRAMUSCULAR | Status: DC
  Filled 2014-08-23: qty 1

## 2014-08-23 MED ORDER — NITROFURANTOIN MONOHYD MACRO 100 MG PO CAPS: 100.0000 mg | ORAL_CAPSULE | Freq: Two times a day (BID) | ORAL | Status: DC

## 2014-08-23 NOTE — Discharge Instructions (Signed)
Read the information below.  Use the prescribed medication as directed.  Please discuss all new medications with your pharmacist and obgyn or your child's pediatrician.  If you choose to take the pain medication, you may want to pump your milk and give the baby formula.  Do not take additional tylenol while taking the prescribed pain medication to avoid overdose.  You may return to the Emergency Department at any time for worsening condition or any new symptoms that concern you.  If you develop high fevers, worsening abdominal pain, uncontrolled vomiting, or are unable to tolerate fluids by mouth, return to the ER for a recheck.

## 2014-08-23 NOTE — ED Notes (Signed)
Pt reports right sided abd pain that started this morning. Reports that she feels nauseated, but has not vomited. Pt reports that the pain is sharp in nature.

## 2014-08-23 NOTE — Progress Notes (Signed)
   Subjective:    Patient ID: Denise Arias, female    DOB: May 31, 1981, 33 y.o.   MRN: [id number]  HPI She started having difficulty with midepigastric pain with associated nausea at 5:00 this morning. It has been intermittent in nature. There has been no vomiting or diarrhea. She did have a glass of milk and apparently he got much worse. She is 2 weeks postpartum.   Review of Systems     Objective:   Physical Exam alert and in moderate distress. She was unable to lay down to do an exam due to pain. While in the sitting position, she did have excruciating right upper quadrant pain. No pain in the right or left lower abdominal area with no rebound. Bowel sounds were diminished.      Assessment & Plan:  Abdominal pain, right upper quadrant  this certainly could potentially be cholecystitis. She is in pretty severe pain and I explained that the emergency room would be the best place to go to get this further evaluated.

## 2014-08-23 NOTE — ED Notes (Signed)
Patient transported to CT 

## 2014-08-23 NOTE — ED Provider Notes (Signed)
CSN: [id number]     Arrival date & time 08/23/14  1609 History   First MD Initiated Contact with Patient 08/23/14 1645     Chief Complaint  Patient presents with  . Abdominal Pain     (Consider location/radiation/quality/duration/timing/severity/associated sxs/prior Treatment) HPI   Pt 13 days postpartum uncomplicated vaginal deliver developed RUQ, epigastric, right chest pain that began this morning sometime between 3-5am, waking her from sleep.  Has associated nausea.  Pain is worse with certain positions, deep inspiration, even light palpation.  Was constipated the past 10 days but had normal bowel movement this morning.  Denies urinary symptoms.  Reports improved vaginal bleeding and soreness since delivery.    Past Medical History  Diagnosis Date  . Allergy     RHINITIS  . GERD (gastroesophageal reflux disease)    Past Surgical History  Procedure Laterality Date  . Vaginal delivery      X 4   Family History  Problem Relation Age of Onset  . Diabetes Mother    History  Substance Use Topics  . Smoking status: Never Smoker   . Smokeless tobacco: Never Used  . Alcohol Use: No   OB History    Gravida Para Term Preterm AB TAB SAB Ectopic Multiple Living   5 5 5       0 1     Review of Systems  All other systems reviewed and are negative.     Allergies  Banana; Fish oil; and Strawberry  Home Medications   Prior to Admission medications   Medication Sig Start Date End Date Taking? Authorizing Provider  ferrous sulfate 325 (65 FE) MG tablet Take 1 tablet (325 mg total) by mouth 2 (two) times daily with a meal. Patient not taking: Reported on 08/23/2014 08/12/14   Venus Standard, CNM  ibuprofen (ADVIL,MOTRIN) 600 MG tablet Take 1 tablet (600 mg total) by mouth every 6 (six) hours. Patient not taking: Reported on 08/23/2014 08/12/14   Venus Standard, CNM  oxyCODONE-acetaminophen (PERCOCET/ROXICET) 5-325 MG per tablet Take 1 tablet by mouth every 4 (four) hours as  needed (for pain scale less than 7). Patient not taking: Reported on 08/23/2014 08/12/14   Venus Standard, CNM  Prenatal Vit-Fe Fumarate-FA (PRENATAL MULTIVITAMIN) TABS tablet Take 1 tablet by mouth daily. Patient not taking: Reported on 08/23/2014 12/31/13   Marny Lowenstein, PA-C   BP 122/86 mmHg  Pulse 63  Temp(Src) 98.1 F (36.7 C) (Oral)  Resp 18  Wt 191 lb (86.637 kg)  SpO2 99%  LMP 10/24/2013 Physical Exam  Constitutional: She appears well-developed and well-nourished. No distress.  HENT:  Head: Normocephalic and atraumatic.  Neck: Neck supple.  Cardiovascular: Normal rate and regular rhythm.   Pulmonary/Chest: Effort normal and breath sounds normal. No respiratory distress. She has no wheezes. She has no rales.  Abdominal: Soft. She exhibits no distension. There is tenderness in the right upper quadrant. There is positive Murphy's sign. There is no rebound and no guarding.  Neurological: She is alert.  Skin: She is not diaphoretic.  Nursing note and vitals reviewed.   ED Course  Procedures (including critical care time) Labs Review Labs Reviewed  CBC WITH DIFFERENTIAL - Abnormal; Notable for the following:    Neutrophils Relative % 84 (*)    Lymphocytes Relative 11 (*)    All other components within normal limits  COMPREHENSIVE METABOLIC PANEL - Abnormal; Notable for the following:    Glucose, Bld 106 (*)    AST 146 (*)  ALT 118 (*)    Alkaline Phosphatase 131 (*)    All other components within normal limits  URINALYSIS, ROUTINE W REFLEX MICROSCOPIC - Abnormal; Notable for the following:    Hgb urine dipstick LARGE (*)    Leukocytes, UA MODERATE (*)    All other components within normal limits  URINE MICROSCOPIC-ADD ON - Abnormal; Notable for the following:    Squamous Epithelial / LPF FEW (*)    Bacteria, UA FEW (*)    All other components within normal limits  LIPASE, BLOOD    Imaging Review Ct Angio Chest Pe W/cm &/or Wo Cm  08/23/2014   CLINICAL DATA:   Right-sided abdominal pain beginning this morning, 2 weeks postpartum, shortness of breath, right upper quadrant abdominal pain  EXAM: CT ANGIOGRAPHY CHEST  CT ABDOMEN AND PELVIS WITH CONTRAST  TECHNIQUE: Multidetector CT imaging of the chest was performed using the standard protocol during bolus administration of intravenous contrast. Multiplanar CT image reconstructions and MIPs were obtained to evaluate the vascular anatomy. Multidetector CT imaging of the abdomen and pelvis was performed using the standard protocol during bolus administration of intravenous contrast.  CONTRAST:  OMNIPAQUE IOHEXOL 350 MG/ML SOLN  COMPARISON:  Chest/abdomen radiographs 08/23/2014  FINDINGS: CTA CHEST FINDINGS  Examination is adequate for evaluation of the pulmonary arteries up to the third order pulmonary arteries but suboptimal or distally due to bolus timing. No central focal filling defect to suggest acute pulmonary embolism. No lymphadenopathy. Mild cardiomegaly is noted. No pericardial or pleural effusion.  Right upper lobe pulmonary parenchymal 0.4 cm nodule image 33. Lungs are otherwise clear. Central airways are patent.  No acute osseous abnormality.  CT ABDOMEN and PELVIS FINDINGS  Liver, gallbladder, adrenal glands, left kidney, spleen, and pancreas are unremarkable. 0.9 cm right lower renal pole cyst image 41. No hydroureteronephrosis.  Uterine enlargement and endometrial hypodensity compatible with recent postpartum state. Ovaries are normal in appearance. Normal appendix, image 57. No bowel wall thickening or focal segmental dilatation is identified. Bladder is distended but otherwise unremarkable. No acute osseous finding.  Review of the MIP images confirms the above findings.  IMPRESSION: No acute cardiopulmonary process. No central focal filling defect to suggest acute pulmonary embolism up to the level of the third order pulmonary arteries.  No acute intra-abdominal or pelvic pathology. Postpartum appearance  to the uterus.   Electronically Signed   By: Christiana Pellant M.D.   On: 08/23/2014 21:11   US Abdomen Complete  08/23/2014   CLINICAL DATA:  Right upper quadrant abdominal pain.  EXAM: ULTRASOUND ABDOMEN COMPLETE  COMPARISON:  None.  FINDINGS: Gallbladder: Multiple tiny less than 1 cm gallstones are seen. Gallbladder is distended, however there is no evidence of gallbladder wall thickening. No sonographic Murphy sign noted by sonographer.  Common bile duct: Diameter: 6 mm, which is at the upper limits of normal.  Liver: No focal lesion identified. Within normal limits in parenchymal echogenicity.  IVC: No abnormality visualized.  Pancreas: Visualized portion unremarkable.  Spleen: Size and appearance within normal limits.  Right Kidney: Length: 12.6 cm. Echogenicity within normal limits. No mass or hydronephrosis visualized.  Left Kidney: Length: 12.7 cm. Echogenicity within normal limits. No mass or hydronephrosis visualized.  Abdominal aorta: No aneurysm visualized.  Other findings: None.  IMPRESSION: Cholelithiasis, without definite signs of acute cholecystitis.  Borderline dilatation of common bile duct which measures 6 mm. Recommend correlation with liver function tests.   Electronically Signed   By: Alver Sorrow.D.  On: 08/23/2014 18:48   Ct Abdomen Pelvis W Contrast  08/23/2014   CLINICAL DATA:  Right-sided abdominal pain beginning this morning, 2 weeks postpartum, shortness of breath, right upper quadrant abdominal pain  EXAM: CT ANGIOGRAPHY CHEST  CT ABDOMEN AND PELVIS WITH CONTRAST  TECHNIQUE: Multidetector CT imaging of the chest was performed using the standard protocol during bolus administration of intravenous contrast. Multiplanar CT image reconstructions and MIPs were obtained to evaluate the vascular anatomy. Multidetector CT imaging of the abdomen and pelvis was performed using the standard protocol during bolus administration of intravenous contrast.  CONTRAST:  100mL OMNIPAQUE IOHEXOL  350 MG/ML SOLN  COMPARISON:  Chest/abdomen radiographs 08/23/2014  FINDINGS: CTA CHEST FINDINGS  Examination is adequate for evaluation of the pulmonary arteries up to the third order pulmonary arteries but suboptimal or distally due to bolus timing. No central focal filling defect to suggest acute pulmonary embolism. No lymphadenopathy. Mild cardiomegaly is noted. No pericardial or pleural effusion.  Right upper lobe pulmonary parenchymal 0.4 cm nodule image 33. Lungs are otherwise clear. Central airways are patent.  No acute osseous abnormality.  CT ABDOMEN and PELVIS FINDINGS  Liver, gallbladder, adrenal glands, left kidney, spleen, and pancreas are unremarkable. 0.9 cm right lower renal pole cyst image 41. No hydroureteronephrosis.  Uterine enlargement and endometrial hypodensity compatible with recent postpartum state. Ovaries are normal in appearance. Normal appendix, image 57. No bowel wall thickening or focal segmental dilatation is identified. Bladder is distended but otherwise unremarkable. No acute osseous finding.  Review of the MIP images confirms the above findings.  IMPRESSION: No acute cardiopulmonary process. No central focal filling defect to suggest acute pulmonary embolism up to the level of the third order pulmonary arteries.  No acute intra-abdominal or pelvic pathology. Postpartum appearance to the uterus.   Electronically Signed   By: Christiana PellantGretchen  Green M.D.   On: 08/23/2014 21:11   Dg Abd Acute W/chest  08/23/2014   CLINICAL DATA:  Right-sided abdominal pain. Nausea. Two weeks postpartum.  EXAM: ACUTE ABDOMEN SERIES (ABDOMEN 2 VIEW & CHEST 1 VIEW)  COMPARISON:  None.  FINDINGS: Several gas-filled small bowel loops are seen in the left abdomen which contain air-fluid levels and may show mild wall thickening. No evidence of dilated bowel loops. No evidence of free air. No radiopaque calculi identified.  Heart size and mediastinal contours are normal. Both lungs are clear.  IMPRESSION:  Nonspecific bowel gas pattern, with possible small bowel wall thickening in left abdomen. Consider abdomen pelvis CT with contrast for further evaluation.  No active cardiopulmonary disease.   Electronically Signed   By: Myles RosenthalJohn  Stahl M.D.   On: 08/23/2014 18:58     EKG Interpretation None       7:04 PM Discussed pt with Dr Gwendolyn GrantWalden who will also see and examine the patient.   MDM   Final diagnoses:  Pain  RUQ abdominal pain  SOB (shortness of breath)  UTI (lower urinary tract infection)  Calculus of gallbladder without cholecystitis without obstruction  Elevated LFTs    AFebrile nontoxic but uncomfortable appearing pt recently postpartum (13 days) with RUQ pain, nausea.  Delivery was at term, induced,no complications, pt reports she is healing well.  She is tender in the RUQ but also has SOB.  Given postpartum status, some concern for PE.  CT angio chest negative.  Abdominal US shows cholelithiasis, no other abnormality.  There is some elevation in LFTs but the WBC is normal and pt is afebrile.  She is also  pain free after one dose of pain medication.  UA shows infection. Sent for culture.  Labs otherwise unremarkable.  Pt is both breastfeeding and feeding formula.  Pt advised that she should speak with her obgyn or the child's pediatrician regarding breastfeeding with the medications and having been given contrast but I did advise that she try to pump to keep up her supply and pump for at least 24 hours and if possible call the doctors in the morning.  I have prescribed macrobid, which is presumed safe for lactation for micromedex and tarscon resources.  Pt given pain medication to be taken PRN but advised that she should consider not breastfeeding while on the medication if she chooses to take it, or consult her physician.  Results discussed with patient and hisband.  They will follow closely with obgyn.  Given general surgery referral as needed for worsening symptoms of cholelithiasis (teaching  done, return precautions discussed).  Pt also advised to have her doctor recheck her LFTs.  Pt to return to ED for worsening symptoms. D/C home. Discussed result, findings, treatment, and follow up  with patient.  Pt given return precautions.  Pt verbalizes understanding and agrees with plan.        Trixie Dredgemily Pernell Lenoir, PA-C 08/23/14 2240  Elwin MochaBlair Walden, MD 08/23/14 [phone]

## 2014-08-23 NOTE — ED Notes (Signed)
Patient is unable to give a urine specimen at this time  

## 2014-08-24 ENCOUNTER — Encounter (HOSPITAL_COMMUNITY): Payer: Self-pay | Admitting: Emergency Medicine

## 2014-08-24 ENCOUNTER — Emergency Department (HOSPITAL_COMMUNITY)
Admission: EM | Admit: 2014-08-24 | Discharge: 2014-08-24 | Disposition: A | Discharge status: Home / Self Care | Payer: Medicaid Other | Attending: Emergency Medicine | Admitting: Emergency Medicine

## 2014-08-24 DIAGNOSIS — Z8719 Personal history of other diseases of the digestive system: Secondary | ICD-10-CM | POA: Insufficient documentation

## 2014-08-24 DIAGNOSIS — R7989 Other specified abnormal findings of blood chemistry: Secondary | ICD-10-CM | POA: Diagnosis not present

## 2014-08-24 DIAGNOSIS — Z79899 Other long term (current) drug therapy: Secondary | ICD-10-CM | POA: Insufficient documentation

## 2014-08-24 DIAGNOSIS — R7401 Elevation of levels of liver transaminase levels: Secondary | ICD-10-CM

## 2014-08-24 DIAGNOSIS — R1011 Right upper quadrant pain: Secondary | ICD-10-CM | POA: Diagnosis present

## 2014-08-24 DIAGNOSIS — R740 Nonspecific elevation of levels of transaminase and lactic acid dehydrogenase [LDH]: Secondary | ICD-10-CM

## 2014-08-24 DIAGNOSIS — N390 Urinary tract infection, site not specified: Secondary | ICD-10-CM | POA: Insufficient documentation

## 2014-08-24 MED ORDER — HYDROMORPHONE HCL 1 MG/ML IJ SOLN
1.0000 mg | Freq: Once | INTRAMUSCULAR | Status: AC
  Administered 2014-08-24: 1 mg via INTRAMUSCULAR
  Filled 2014-08-24: qty 1

## 2014-08-24 MED ORDER — GI COCKTAIL ~~LOC~~
30.0000 mL | Freq: Once | ORAL | Status: AC
  Administered 2014-08-24: 30 mL via ORAL
  Filled 2014-08-24: qty 30

## 2014-08-24 MED ORDER — DICYCLOMINE HCL 10 MG/ML IM SOLN
20.0000 mg | Freq: Once | INTRAMUSCULAR | Status: AC
  Administered 2014-08-24: 20 mg via INTRAMUSCULAR
  Filled 2014-08-24: qty 2

## 2014-08-24 NOTE — ED Notes (Signed)
Dr. Palumbo at bedside. 

## 2014-08-24 NOTE — ED Notes (Signed)
Pt. reports persistent RUQ pain for several days , seen here last night for the same complaint ( blood tests , urine test , abdominal CT scan and ultrasound done ) discharged home diagnosed with UTI with prescriptions .

## 2014-08-24 NOTE — ED Provider Notes (Signed)
CSN: [id number]     Arrival date & time 08/24/14  4098 History  This chart was scribed for Marissa Weaver Smitty Cords, MD by Freida Busman, ED Scribe. This patient was seen in room A03C/A03C and the patient's care was started 3:45 AM.    Chief Complaint  Patient presents with  . Abdominal Pain      Patient is a 33 y.o. female presenting with abdominal pain. The history is provided by the patient, medical records and the spouse. No language interpreter was used.  Abdominal Pain Pain location:  RUQ and epigastric Pain quality: sharp   Pain radiates to:  Epigastric region Pain severity:  Moderate Timing:  Constant Progression:  Unchanged Chronicity:  New Context: not alcohol use   Relieved by:  Nothing Worsened by:  Nothing tried Ineffective treatments:  None tried Associated symptoms: no chills, no fever and no vomiting   Risk factors: not obese      HPI Comments:  Denise Arias is a 33 y.o. female 13 days postpartum who presents to the Emergency Department complaining of persistent RUQ abdominal pain. Pt was seen in this ED earlier today for the same she had a negative CT and was diagnosed with a UTI. She was discharged home but returned because she was unable to fill her prescription for pain medicine. Pt's husband reports decreased PO intake except she ate a chicken sandwich when she got home.     Past Medical History  Diagnosis Date  . Allergy     RHINITIS  . GERD (gastroesophageal reflux disease)    Past Surgical History  Procedure Laterality Date  . Vaginal delivery      X 4   Family History  Problem Relation Age of Onset  . Diabetes Mother    History  Substance Use Topics  . Smoking status: Never Smoker   . Smokeless tobacco: Never Used  . Alcohol Use: No   OB History    Gravida Para Term Preterm AB TAB SAB Ectopic Multiple Living   5 5 5       0 1     Review of Systems  Constitutional: Negative for fever and chills.  Gastrointestinal: Positive for  abdominal pain. Negative for vomiting.  All other systems reviewed and are negative.     Allergies  Banana; Fish oil; and Strawberry  Home Medications   Prior to Admission medications   Medication Sig Start Date End Date Taking? Authorizing Provider  HYDROcodone-acetaminophen (NORCO/VICODIN) 5-325 MG per tablet Take 1-2 tablets by mouth every 4 (four) hours as needed for moderate pain or severe pain. 08/23/14   Trixie Dredge, PA-C  nitrofurantoin, macrocrystal-monohydrate, (MACROBID) 100 MG capsule Take 1 capsule (100 mg total) by mouth 2 (two) times daily. 08/23/14   Trixie Dredge, PA-C   BP 127/75 mmHg  Pulse 70  Temp(Src) 98.1 F (36.7 C) (Oral)  Resp 14  Ht 5\' 4"  (1.626 m)  Wt 185 lb (83.915 kg)  BMI 31.74 kg/m2  SpO2 98%  LMP 10/24/2013 Physical Exam  Constitutional: She is oriented to person, place, and time. She appears well-developed and well-nourished.  HENT:  Head: Normocephalic and atraumatic.  Mouth/Throat: Oropharynx is clear and moist.  Eyes: Conjunctivae are normal. Pupils are equal, round, and reactive to light.  Neck: Normal range of motion. Neck supple.  Cardiovascular: Normal rate, regular rhythm and normal heart sounds.   Pulmonary/Chest: Effort normal and breath sounds normal. No respiratory distress. She has no wheezes. She has no rales.  Abdominal: Soft.  Bowel sounds are normal. She exhibits no distension and no mass. There is no tenderness. There is no rebound and no guarding.  Bowel Sounds into thoracic cavity  Negative murphy's sign  Musculoskeletal: Normal range of motion.  Neurological: She is alert and oriented to person, place, and time.  Skin: Skin is warm and dry.  Psychiatric: She has a normal mood and affect.  Nursing note and vitals reviewed.   ED Course  Procedures   DIAGNOSTIC STUDIES:  Oxygen Saturation is 98% on RA, normal by my interpretation.    COORDINATION OF CARE:  3:55 AM Discussed treatment plan with pt and husband at  bedside and they agreed to plan.  Labs Review Labs Reviewed - No data to display  Imaging Review Ct Angio Chest Pe W/cm &/or Wo Cm  08/23/2014   CLINICAL DATA:  Right-sided abdominal pain beginning this morning, 2 weeks postpartum, shortness of breath, right upper quadrant abdominal pain  EXAM: CT ANGIOGRAPHY CHEST  CT ABDOMEN AND PELVIS WITH CONTRAST  TECHNIQUE: Multidetector CT imaging of the chest was performed using the standard protocol during bolus administration of intravenous contrast. Multiplanar CT image reconstructions and MIPs were obtained to evaluate the vascular anatomy. Multidetector CT imaging of the abdomen and pelvis was performed using the standard protocol during bolus administration of intravenous contrast.  CONTRAST:  OMNIPAQUE IOHEXOL 350 MG/ML SOLN  COMPARISON:  Chest/abdomen radiographs 08/23/2014  FINDINGS: CTA CHEST FINDINGS  Examination is adequate for evaluation of the pulmonary arteries up to the third order pulmonary arteries but suboptimal or distally due to bolus timing. No central focal filling defect to suggest acute pulmonary embolism. No lymphadenopathy. Mild cardiomegaly is noted. No pericardial or pleural effusion.  Right upper lobe pulmonary parenchymal 0.4 cm nodule image 33. Lungs are otherwise clear. Central airways are patent.  No acute osseous abnormality.  CT ABDOMEN and PELVIS FINDINGS  Liver, gallbladder, adrenal glands, left kidney, spleen, and pancreas are unremarkable. 0.9 cm right lower renal pole cyst image 41. No hydroureteronephrosis.  Uterine enlargement and endometrial hypodensity compatible with recent postpartum state. Ovaries are normal in appearance. Normal appendix, image 57. No bowel wall thickening or focal segmental dilatation is identified. Bladder is distended but otherwise unremarkable. No acute osseous finding.  Review of the MIP images confirms the above findings.  IMPRESSION: No acute cardiopulmonary process. No central focal  filling defect to suggest acute pulmonary embolism up to the level of the third order pulmonary arteries.  No acute intra-abdominal or pelvic pathology. Postpartum appearance to the uterus.   Electronically Signed   By: Christiana Pellant M.D.   On: 08/23/2014 21:11   US Abdomen Complete  08/23/2014   CLINICAL DATA:  Right upper quadrant abdominal pain.  EXAM: ULTRASOUND ABDOMEN COMPLETE  COMPARISON:  None.  FINDINGS: Gallbladder: Multiple tiny less than 1 cm gallstones are seen. Gallbladder is distended, however there is no evidence of gallbladder wall thickening. No sonographic Murphy sign noted by sonographer.  Common bile duct: Diameter: 6 mm, which is at the upper limits of normal.  Liver: No focal lesion identified. Within normal limits in parenchymal echogenicity.  IVC: No abnormality visualized.  Pancreas: Visualized portion unremarkable.  Spleen: Size and appearance within normal limits.  Right Kidney: Length: 12.6 cm. Echogenicity within normal limits. No mass or hydronephrosis visualized.  Left Kidney: Length: 12.7 cm. Echogenicity within normal limits. No mass or hydronephrosis visualized.  Abdominal aorta: No aneurysm visualized.  Other findings: None.  IMPRESSION: Cholelithiasis, without definite signs of  acute cholecystitis.  Borderline dilatation of common bile duct which measures 6 mm. Recommend correlation with liver function tests.   Electronically Signed   By: Myles RosenthalJohn  Stahl M.D.   On: 08/23/2014 18:48   Ct Abdomen Pelvis W Contrast  08/23/2014   CLINICAL DATA:  Right-sided abdominal pain beginning this morning, 2 weeks postpartum, shortness of breath, right upper quadrant abdominal pain  EXAM: CT ANGIOGRAPHY CHEST  CT ABDOMEN AND PELVIS WITH CONTRAST  TECHNIQUE: Multidetector CT imaging of the chest was performed using the standard protocol during bolus administration of intravenous contrast. Multiplanar CT image reconstructions and MIPs were obtained to evaluate the vascular anatomy.  Multidetector CT imaging of the abdomen and pelvis was performed using the standard protocol during bolus administration of intravenous contrast.  CONTRAST:  100mL OMNIPAQUE IOHEXOL 350 MG/ML SOLN  COMPARISON:  Chest/abdomen radiographs 08/23/2014  FINDINGS: CTA CHEST FINDINGS  Examination is adequate for evaluation of the pulmonary arteries up to the third order pulmonary arteries but suboptimal or distally due to bolus timing. No central focal filling defect to suggest acute pulmonary embolism. No lymphadenopathy. Mild cardiomegaly is noted. No pericardial or pleural effusion.  Right upper lobe pulmonary parenchymal 0.4 cm nodule image 33. Lungs are otherwise clear. Central airways are patent.  No acute osseous abnormality.  CT ABDOMEN and PELVIS FINDINGS  Liver, gallbladder, adrenal glands, left kidney, spleen, and pancreas are unremarkable. 0.9 cm right lower renal pole cyst image 41. No hydroureteronephrosis.  Uterine enlargement and endometrial hypodensity compatible with recent postpartum state. Ovaries are normal in appearance. Normal appendix, image 57. No bowel wall thickening or focal segmental dilatation is identified. Bladder is distended but otherwise unremarkable. No acute osseous finding.  Review of the MIP images confirms the above findings.  IMPRESSION: No acute cardiopulmonary process. No central focal filling defect to suggest acute pulmonary embolism up to the level of the third order pulmonary arteries.  No acute intra-abdominal or pelvic pathology. Postpartum appearance to the uterus.   Electronically Signed   By: Christiana PellantGretchen  Green M.D.   On: 08/23/2014 21:11   Dg Abd Acute W/chest  08/23/2014   CLINICAL DATA:  Right-sided abdominal pain. Nausea. Two weeks postpartum.  EXAM: ACUTE ABDOMEN SERIES (ABDOMEN 2 VIEW & CHEST 1 VIEW)  COMPARISON:  None.  FINDINGS: Several gas-filled small bowel loops are seen in the left abdomen which contain air-fluid levels and may show mild wall thickening. No  evidence of dilated bowel loops. No evidence of free air. No radiopaque calculi identified.  Heart size and mediastinal contours are normal. Both lungs are clear.  IMPRESSION: Nonspecific bowel gas pattern, with possible small bowel wall thickening in left abdomen. Consider abdomen pelvis CT with contrast for further evaluation.  No active cardiopulmonary disease.   Electronically Signed   By: Myles RosenthalJohn  Stahl M.D.   On: 08/23/2014 18:58     EKG Interpretation None      MDM   Final diagnoses:  None   Patient has not taken her medication as has not filled.  Returns for same.  There is no indication for recheck labs or imaging as patient has only been home a short time.     Labs and imaging done within 12 hours will treat symptomatically.  Will refer to [facility] for outpatient discussion of gall bladder removal.  Following dilaudid you should dump breast milk and consult your pediatrician.  Macrobid and norco as directed by Dr. Gwendolyn GrantWalden  I personally performed the services described in this documentation, which was  scribed in my presence. The recorded information has been reviewed and is accurate.     Jasmine AweApril K Isla Sabree-Rasch, MD 08/24/14 [phone]

## 2014-08-24 NOTE — ED Notes (Signed)
Pt A&oX4, AMBULATORY AT D/C WITH STEADY GAIT, NAD, UNDESTANDS D/C INSTRUCTIONS AND IMPORTANCE OF PUMPING HER BREAST MILK AFTER INGESTION OF DILAUDID AND HER PRESCRIPTION PAIN MEDS.

## 2014-08-26 ENCOUNTER — Encounter (HOSPITAL_COMMUNITY): Payer: Self-pay | Admitting: Emergency Medicine

## 2014-08-26 ENCOUNTER — Emergency Department (HOSPITAL_COMMUNITY): Payer: Medicaid Other

## 2014-08-26 ENCOUNTER — Emergency Department (HOSPITAL_COMMUNITY)
Admission: EM | Admit: 2014-08-26 | Discharge: 2014-08-26 | Disposition: A | Discharge status: Home / Self Care | Payer: Medicaid Other | Attending: Emergency Medicine | Admitting: Emergency Medicine

## 2014-08-26 DIAGNOSIS — K8020 Calculus of gallbladder without cholecystitis without obstruction: Secondary | ICD-10-CM | POA: Diagnosis not present

## 2014-08-26 DIAGNOSIS — R1011 Right upper quadrant pain: Secondary | ICD-10-CM | POA: Diagnosis not present

## 2014-08-26 LAB — COMPREHENSIVE METABOLIC PANEL
ALBUMIN: 3.4 g/dL — AB (ref 3.5–5.2)
ALT: 103 U/L — ABNORMAL HIGH (ref 0–35)
ANION GAP: 9 (ref 5–15)
AST: 110 U/L — ABNORMAL HIGH (ref 0–37)
Alkaline Phosphatase: 114 U/L (ref 39–117)
BILIRUBIN TOTAL: 0.6 mg/dL (ref 0.3–1.2)
BUN: 15 mg/dL (ref 6–23)
CO2: 24 mmol/L (ref 19–32)
CREATININE: 0.66 mg/dL (ref 0.50–1.10)
Calcium: 9 mg/dL (ref 8.4–10.5)
Chloride: 104 mEq/L (ref 96–112)
GFR calc Af Amer: >90 mL/min (ref >90)
GFR calc non Af Amer: >90 mL/min (ref >90)
Glucose, Bld: 120 mg/dL — ABNORMAL HIGH (ref 70–99)
Potassium: 3.9 mmol/L (ref 3.5–5.1)
Sodium: 137 mmol/L (ref 135–145)
TOTAL PROTEIN: 6.5 g/dL (ref 6.0–8.3)

## 2014-08-26 LAB — CBC WITH DIFFERENTIAL/PLATELET
BASOS PCT: 0 % (ref 0–1)
Basophils Absolute: 0 10*3/uL (ref 0.0–0.1)
Eosinophils Absolute: 0.1 10*3/uL (ref 0.0–0.7)
Eosinophils Relative: 1 % (ref 0–5)
HCT: 36.9 % (ref 36.0–46.0)
Hemoglobin: 11.9 g/dL — ABNORMAL LOW (ref 12.0–15.0)
Lymphocytes Relative: 15 % (ref 12–46)
Lymphs Abs: 1.1 10*3/uL (ref 0.7–4.0)
MCH: 28.1 pg (ref 26.0–34.0)
MCHC: 32.2 g/dL (ref 30.0–36.0)
MCV: 87.2 fL (ref 78.0–100.0)
MONO ABS: 0.5 10*3/uL (ref 0.1–1.0)
Monocytes Relative: 7 % (ref 3–12)
NEUTROS PCT: 77 % (ref 43–77)
Neutro Abs: 5.6 10*3/uL (ref 1.7–7.7)
Platelets: 233 10*3/uL (ref 150–400)
RBC: 4.23 MIL/uL (ref 3.87–5.11)
RDW: 13.1 % (ref 11.5–15.5)
WBC: 7.4 10*3/uL (ref 4.0–10.5)

## 2014-08-26 LAB — I-STAT CG4 LACTIC ACID, ED: LACTIC ACID, VENOUS: 1.1 mmol/L (ref 0.5–2.2)

## 2014-08-26 LAB — LIPASE, BLOOD: LIPASE: 29 U/L (ref 11–59)

## 2014-08-26 MED ORDER — MORPHINE SULFATE 4 MG/ML IJ SOLN
4.0000 mg | Freq: Once | INTRAMUSCULAR | Status: AC
  Administered 2014-08-26: 4 mg via INTRAVENOUS
  Filled 2014-08-26 (×2): qty 1

## 2014-08-26 MED ORDER — ONDANSETRON HCL 4 MG/2ML IJ SOLN
4.0000 mg | Freq: Once | INTRAMUSCULAR | Status: AC
  Administered 2014-08-26: 4 mg via INTRAVENOUS
  Filled 2014-08-26 (×2): qty 2

## 2014-08-26 MED ORDER — SODIUM CHLORIDE 0.9 % IV BOLUS (SEPSIS)
1000.0000 mL | Freq: Once | INTRAVENOUS | Status: AC
  Administered 2014-08-26: 1000 mL via INTRAVENOUS

## 2014-08-26 NOTE — ED Notes (Signed)
Discharge instructions reviewed, need to call for appointment on Monday reviewed and both voiced understanding.

## 2014-08-26 NOTE — ED Notes (Signed)
Patient is refusing to have blood drawn and IV started.  Requesting to talk to the MD.  Dr Mora Bellmanni aware

## 2014-08-26 NOTE — Discharge Instructions (Signed)
Cholelithiasis  Denise Arias, you were seen for abdominal pain. Your blood work reveals that you have gallstones. Call surgery and make an appointment for possible removal of her gallbladder. If symptoms worsen or he develop fever come back to the emergency department immediately for repeat evaluation. Thank you. Cholelithiasis (also called gallstones) is a form of gallbladder disease. The gallbladder is a small organ that helps you digest fats. Symptoms of gallstones are:  Feeling sick to your stomach (nausea).  Throwing up (vomiting).  Belly pain.  Yellowing of the skin (jaundice).  Sudden pain. You may feel the pain for minutes to hours.  Fever.  Pain to the touch. HOME CARE  Only take medicines as told by your doctor.  Eat a low-fat diet until you see your doctor again. Eating fat can result in pain.  Follow up with your doctor as told. Attacks usually happen time after time. Surgery is usually needed for permanent treatment. GET HELP RIGHT AWAY IF:   Your pain gets worse.  Your pain is not helped by medicines.  You have a fever and lasting symptoms for more than 2-3 days.  You have a fever and your symptoms suddenly get worse.  You keep feeling sick to your stomach and throwing up. MAKE SURE YOU:   Understand these instructions.  Will watch your condition.  Will get help right away if you are not doing well or get worse. Document Released: 02/05/2008 Document Revised: 04/21/2013 Document Reviewed: 02/10/2013 [facility] Patient Information 2015 [employer], [place]. This information is not intended to replace advice given to you by your health care provider. Make sure you discuss any questions you have with your health care provider.

## 2014-08-26 NOTE — ED Provider Notes (Signed)
CSN: [id number]     Arrival date & time 08/26/14  0303 History  This chart was scribed for Tomasita CrumbleAdeleke Bary Limbach, MD by Richarda Overlieichard Holland, ED Scribe. This patient was seen in room D32C/D32C and the patient's care was started 3:33 AM.     Chief Complaint  Patient presents with  . Abdominal Pain   Patient is a 33 y.o. female presenting with abdominal pain. The history is provided by the patient and the spouse. No language interpreter was used.  Abdominal Pain Associated symptoms: no diarrhea, no dysuria, no fever, no hematuria, no vaginal discharge and no vomiting    HPI Comments: Denise Arias is a 33 y.o. female with a history of gallstones who presents to the Emergency Department complaining of recurrent upper abdominal pain that started 3 days ago. She describes the pain as sharp and states the pain radiates to her back. She delivered a baby 08/10/14. Pt states deep breathing and certain positions worsens her pain. Pt reports no similar previous episodes. She reports no alleviating symptoms. She denies vomiting, diarrhea, fever, hematuria, vaginal discharge, or dysuria. She recently got Norco and Macrobid filled and took 1 pill of each prior to arrival. Patient was seen in the emergency department for the same complaint twice in the last week.   Past Medical History  Diagnosis Date  . Allergy     RHINITIS  . GERD (gastroesophageal reflux disease)    Past Surgical History  Procedure Laterality Date  . Vaginal delivery      X 4   Family History  Problem Relation Age of Onset  . Diabetes Mother    History  Substance Use Topics  . Smoking status: Never Smoker   . Smokeless tobacco: Never Used  . Alcohol Use: No   OB History    Gravida Para Term Preterm AB TAB SAB Ectopic Multiple Living   5 5 5       0 1     Review of Systems  Constitutional: Negative for fever.  Gastrointestinal: Positive for abdominal pain. Negative for vomiting and diarrhea.  Genitourinary: Negative for  dysuria, hematuria and vaginal discharge.  All other systems reviewed and are negative.   Allergies  Banana; Fish oil; and Strawberry  Home Medications   Prior to Admission medications   Medication Sig Start Date End Date Taking? Authorizing Provider  HYDROcodone-acetaminophen (NORCO/VICODIN) 5-325 MG per tablet Take 1-2 tablets by mouth every 4 (four) hours as needed for moderate pain or severe pain. 08/23/14   Trixie DredgeEmily West, PA-C  nitrofurantoin, macrocrystal-monohydrate, (MACROBID) 100 MG capsule Take 1 capsule (100 mg total) by mouth 2 (two) times daily. 08/23/14   Trixie DredgeEmily West, PA-C   BP 121/71 mmHg  Pulse 62  Temp(Src) 98.2 F (36.8 C) (Oral)  Resp 18  Ht 5\' 4"  (1.626 m)  Wt 185 lb (83.915 kg)  BMI 31.74 kg/m2  SpO2 100% Physical Exam  Constitutional: She is oriented to person, place, and time. She appears well-developed and well-nourished. No distress.  HENT:  Head: Normocephalic and atraumatic.  Eyes: Conjunctivae and EOM are normal. Pupils are equal, round, and reactive to light. No scleral icterus.  Neck: Normal range of motion. Neck supple. No JVD present. No tracheal deviation present. No thyromegaly present.  Cardiovascular: Normal rate, regular rhythm and normal heart sounds.  Exam reveals no gallop and no friction rub.   No murmur heard. Pulmonary/Chest: Effort normal and breath sounds normal. No respiratory distress. She has no wheezes. She exhibits no tenderness.  Abdominal:  Soft. Bowel sounds are normal. She exhibits no distension and no mass. There is tenderness. There is no rebound and no guarding.  RUQ TTP. Mid epigastric TTP.   Musculoskeletal: Normal range of motion. She exhibits no edema or tenderness.  Lymphadenopathy:    She has no cervical adenopathy.  Neurological: She is alert and oriented to person, place, and time.  Skin: Skin is warm and dry. No rash noted. No erythema. No pallor.  Nursing note and vitals reviewed.   ED Course  Procedures    DIAGNOSTIC STUDIES: Oxygen Saturation is 100% on RA, normal by my interpretation.    COORDINATION OF CARE: 3:41 AM Discussed treatment plan with pt at bedside and pt agreed to plan.   Labs Review Labs Reviewed  CBC WITH DIFFERENTIAL - Abnormal; Notable for the following:    Hemoglobin 11.9 (*)    All other components within normal limits  COMPREHENSIVE METABOLIC PANEL - Abnormal; Notable for the following:    Glucose, Bld 120 (*)    Albumin 3.4 (*)    AST 110 (*)    ALT 103 (*)    All other components within normal limits  LIPASE, BLOOD  I-STAT CG4 LACTIC ACID, ED    Imaging Review [place] Abdomen Limited  08/26/2014   CLINICAL DATA:  Acute onset of right upper quadrant abdominal pain. Initial encounter.  EXAM: US ABDOMEN LIMITED - RIGHT UPPER QUADRANT  COMPARISON:  CT of the abdomen and pelvis, and abdominal ultrasound, performed 08/23/2014  FINDINGS: Gallbladder:  Scattered small stones are seen layering dependently within the gallbladder. The gallbladder is otherwise unremarkable in appearance. No gallbladder wall thickening or pericholecystic fluid is seen. No ultrasonographic Murphy's sign is elicited.  Common bile duct:  Diameter: 0.5 cm, within normal limits in caliber.  Liver:  No focal lesion identified. Within normal limits in parenchymal echogenicity.  IMPRESSION: 1. No acute abnormality at the right upper quadrant. 2. Cholelithiasis; gallbladder otherwise unremarkable.   Electronically Signed   By: Roanna RaiderJeffery  Chang M.D.   On: 08/26/2014 04:22     EKG Interpretation None      MDM   Final diagnoses:  None    Patient since emergency department for right upper quadrant abdominal pain. Ultrasound reveals cholelithiasis, she has a positive Murphy's sign for me on exam. We'll obtain laboratory studies, her last studies reveal elevated LFTs as well as an elevated bilirubin. If this continues to be high on today's labwork, in addition to her symptoms, will consider consultation  with surgery for cholecystectomy.  She was given morphine and Zofran for relief.  Upon repeat evaluation patient's symptoms have improved. Transaminitis is slightly improved, bilirubin has improved by 50%. Patient overall appears well and can follow with surgery outpatient for symptomatic cholelithiasis. Signs remain within her normal limits and she is safe for discharge. Patient was also advised that she does not need to continue Macrobid as her urinalysis did not reveal significant infection. She is having no urinary symptoms.    I personally performed the services described in this documentation, which was scribed in my presence. The recorded information has been reviewed and is accurate.       Tomasita CrumbleAdeleke Jamerson Vonbargen, MD 08/26/14 [phone]

## 2014-08-26 NOTE — ED Notes (Signed)
Patient here due to right upper quadrant pain that travels thru to her back.  This is the 3rd time here for the same thing.  Has been given discharge instructions and instructed to call her OB GYN (2 weeks post partum) and [facility].  Patient and husband did not totally understand the need to call [facility] and what foods to avoid.  Explained foods to avoid and the need to call [facility].

## 2014-08-26 NOTE — ED Notes (Signed)
Pt reports continued upper abdominal pain radiating to back. Dx with gallstones previously.

## 2014-08-29 ENCOUNTER — Encounter: Payer: Self-pay | Admitting: Family Medicine

## 2014-08-29 ENCOUNTER — Ambulatory Visit (INDEPENDENT_AMBULATORY_CARE_PROVIDER_SITE_OTHER): Payer: Medicaid Other | Admitting: Family Medicine

## 2014-08-29 VITALS — BP 110/60 | HR 70 | Wt 173.0 lb

## 2014-08-29 DIAGNOSIS — K8020 Calculus of gallbladder without cholecystitis without obstruction: Secondary | ICD-10-CM

## 2014-08-29 NOTE — Progress Notes (Signed)
   Subjective:    Patient ID: Denise Arias, female    DOB: 1980/11/02, 33 y.o.   MRN: [id number]  HPI She is here for recheck. She was seen 3 times in the emergency room and evaluated for right upper quadrant pain. The ultrasound was positive. She was given pain medication. At this time she is doing much better. She has not been set up to see general surgery yet.   Review of Systems     Objective:   Physical Exam  Alert and in no distress and having no pain.      Assessment & Plan:  Symptomatic cholelithiasis - Plan: Ambulatory referral to General Surgery  Cautioned her to avoid greasy foods and use pain medication as needed. Explained that the codeine can also be sedating for her child who she is breast-feeding.

## 2014-11-09 ENCOUNTER — Emergency Department (HOSPITAL_COMMUNITY)
Admission: EM | Admit: 2014-11-09 | Discharge: 2014-11-09 | Disposition: A | Discharge status: Home / Self Care | Payer: Self-pay | Attending: Emergency Medicine | Admitting: Emergency Medicine

## 2014-11-09 ENCOUNTER — Encounter (HOSPITAL_COMMUNITY): Payer: Self-pay | Admitting: *Deleted

## 2014-11-09 DIAGNOSIS — Z79899 Other long term (current) drug therapy: Secondary | ICD-10-CM | POA: Insufficient documentation

## 2014-11-09 DIAGNOSIS — S0181XA Laceration without foreign body of other part of head, initial encounter: Secondary | ICD-10-CM | POA: Insufficient documentation

## 2014-11-09 DIAGNOSIS — Y998 Other external cause status: Secondary | ICD-10-CM | POA: Insufficient documentation

## 2014-11-09 DIAGNOSIS — Y9289 Other specified places as the place of occurrence of the external cause: Secondary | ICD-10-CM | POA: Insufficient documentation

## 2014-11-09 DIAGNOSIS — W01198A Fall on same level from slipping, tripping and stumbling with subsequent striking against other object, initial encounter: Secondary | ICD-10-CM | POA: Insufficient documentation

## 2014-11-09 DIAGNOSIS — Z8719 Personal history of other diseases of the digestive system: Secondary | ICD-10-CM | POA: Insufficient documentation

## 2014-11-09 DIAGNOSIS — Y9389 Activity, other specified: Secondary | ICD-10-CM | POA: Insufficient documentation

## 2014-11-09 MED ORDER — LIDOCAINE-EPINEPHRINE (PF) 2 %-1:200000 IJ SOLN
20.0000 mL | Freq: Once | INTRAMUSCULAR | Status: AC
  Administered 2014-11-09: 20 mL via INTRADERMAL

## 2014-11-09 MED ORDER — TETANUS-DIPHTH-ACELL PERTUSSIS 5-2.5-18.5 LF-MCG/0.5 IM SUSP
0.5000 mL | Freq: Once | INTRAMUSCULAR | Status: AC
  Administered 2014-11-09: 0.5 mL via INTRAMUSCULAR
  Filled 2014-11-09: qty 0.5

## 2014-11-09 MED ORDER — BACITRACIN ZINC 500 UNIT/GM EX OINT
1.0000 "application " | TOPICAL_OINTMENT | Freq: Once | CUTANEOUS | Status: AC
  Administered 2014-11-09: 1 via TOPICAL
  Filled 2014-11-09: qty 15

## 2014-11-09 NOTE — Discharge Instructions (Signed)
You may take ibuprofen for pain.  Expect to have some swelling at the laceration site.  You may have some bruising of your right eye due to tracking of blood down your forehead.  Ice will help with swelling.  Watch for signs of infection: Redness, drainage of pus, worsening pain.  Return to the emergency department for these findings.  Follow-up with your primary care doctor in 5 days for removal of sutures.   Laceration Care, Adult A laceration is a cut or lesion that goes through all layers of the skin and into the tissue just beneath the skin. TREATMENT  Some lacerations may not require closure. Some lacerations may not be able to be closed due to an increased risk of infection. It is important to see your caregiver as soon as possible after an injury to minimize the risk of infection and maximize the opportunity for successful closure. If closure is appropriate, pain medicines may be given, if needed. The wound will be cleaned to help prevent infection. Your caregiver will use stitches (sutures), staples, wound glue (adhesive), or skin adhesive strips to repair the laceration. These tools bring the skin edges together to allow for faster healing and a better cosmetic outcome. However, all wounds will heal with a scar. Once the wound has healed, scarring can be minimized by covering the wound with sunscreen during the day for 1 full year. HOME CARE INSTRUCTIONS  For sutures or staples:  Keep the wound clean and dry.  If you were given a bandage (dressing), you should change it at least once a day. Also, change the dressing if it becomes wet or dirty, or as directed by your caregiver.  Wash the wound with soap and water 2 times a day. Rinse the wound off with water to remove all soap. Pat the wound dry with a clean towel.  After cleaning, apply a thin layer of the antibiotic ointment as recommended by your caregiver. This will help prevent infection and keep the dressing from sticking.  You may  shower as usual after the first 24 hours. Do not soak the wound in water until the sutures are removed.  Only take over-the-counter or prescription medicines for pain, discomfort, or fever as directed by your caregiver.  Get your sutures or staples removed as directed by your caregiver. For skin adhesive strips:  Keep the wound clean and dry.  Do not get the skin adhesive strips wet. You may bathe carefully, using caution to keep the wound dry.  If the wound gets wet, pat it dry with a clean towel.  Skin adhesive strips will fall off on their own. You may trim the strips as the wound heals. Do not remove skin adhesive strips that are still stuck to the wound. They will fall off in time. For wound adhesive:  You may briefly wet your wound in the shower or bath. Do not soak or scrub the wound. Do not swim. Avoid periods of heavy perspiration until the skin adhesive has fallen off on its own. After showering or bathing, gently pat the wound dry with a clean towel.  Do not apply liquid medicine, cream medicine, or ointment medicine to your wound while the skin adhesive is in place. This may loosen the film before your wound is healed.  If a dressing is placed over the wound, be careful not to apply tape directly over the skin adhesive. This may cause the adhesive to be pulled off before the wound is healed.  Avoid prolonged  exposure to sunlight or tanning lamps while the skin adhesive is in place. Exposure to ultraviolet light in the first year will darken the scar.  The skin adhesive will usually remain in place for 5 to 10 days, then naturally fall off the skin. Do not pick at the adhesive film. You may need a tetanus shot if:  You cannot remember when you had your last tetanus shot.  You have never had a tetanus shot. If you get a tetanus shot, your arm may swell, get red, and feel warm to the touch. This is common and not a problem. If you need a tetanus shot and you choose not to have  one, there is a rare chance of getting tetanus. Sickness from tetanus can be serious. SEEK MEDICAL CARE IF:   You have redness, swelling, or increasing pain in the wound.  You see a red line that goes away from the wound.  You have yellowish-white fluid (pus) coming from the wound.  You have a fever.  You notice a bad smell coming from the wound or dressing.  Your wound breaks open before or after sutures have been removed.  You notice something coming out of the wound such as wood or glass.  Your wound is on your hand or foot and you cannot move a finger or toe. SEEK IMMEDIATE MEDICAL CARE IF:   Your pain is not controlled with prescribed medicine.  You have severe swelling around the wound causing pain and numbness or a change in color in your arm, hand, leg, or foot.  Your wound splits open and starts bleeding.  You have worsening numbness, weakness, or loss of function of any joint around or beyond the wound.  You develop painful lumps near the wound or on the skin anywhere on your body. MAKE SURE YOU:   Understand these instructions.  Will watch your condition.  Will get help right away if you are not doing well or get worse. Document Released: 08/19/2005 Document Revised: 11/11/2011 Document Reviewed: 02/12/2011 [facility] Patient Information 2015 [employer] [place]. This information is not intended to replace advice given to you by your health care provider. Make sure you discuss any questions you have with your health care provider.

## 2014-11-09 NOTE — ED Notes (Signed)
Lacerated rt forehead after she fell in water and struck her head on the floor.  No loc

## 2014-11-09 NOTE — ED Notes (Signed)
Laceration cleaned with soap and water,   Laceration 1-2 "  Down to the skull no active bleeding at present

## 2014-11-09 NOTE — ED Provider Notes (Signed)
CSN: [id number]     Arrival date & time 11/09/14  0442 History   First MD Initiated Contact with Patient 11/09/14 0533     Chief Complaint  Patient presents with  . Laceration     (Consider location/radiation/quality/duration/timing/severity/associated sxs/prior Treatment) HPI 34 year old female presents to the emergency department from home after a fall.  Patient reports she slipped on some water on the floor and hit her head.  She denies LOC.  She is unsure of her last tetanus update.  She has a 3 cm laceration to her right forehead.  At this time bleeding is controlled. Past Medical History  Diagnosis Date  . Allergy     RHINITIS  . GERD (gastroesophageal reflux disease)    Past Surgical History  Procedure Laterality Date  . Vaginal delivery      X 4   Family History  Problem Relation Age of Onset  . Diabetes Mother    History  Substance Use Topics  . Smoking status: Never Smoker   . Smokeless tobacco: Never Used  . Alcohol Use: No   OB History    Gravida Para Term Preterm AB TAB SAB Ectopic Multiple Living   0 1     Review of Systems   See History of Present Illness; otherwise all other systems are reviewed and negative  Allergies  Banana; Fish oil; and Strawberry  Home Medications   Prior to Admission medications   Medication Sig Start Date End Date Taking? Authorizing Provider  HYDROcodone-acetaminophen (NORCO/VICODIN) 5-325 MG per tablet Take 1-2 tablets by mouth every 4 (four) hours as needed for moderate pain or severe pain. Patient not taking: Reported on 08/29/2014 08/23/14   Trixie Dredge, PA-C  nitrofurantoin, macrocrystal-monohydrate, (MACROBID) 100 MG capsule Take 1 capsule (100 mg total) by mouth 2 (two) times daily. Patient not taking: Reported on 08/29/2014 08/23/14   Trixie Dredge, PA-C   BP 111/65 mmHg  Pulse 68  Temp(Src) 98 F (36.7 C)  Resp 18  Ht  (1.626 m)  Wt 175 lb (79.379 kg)  BMI 30.02 kg/m2  SpO2 98% Physical Exam   Constitutional: She is oriented to person, place, and time. She appears well-developed and well-nourished.  HENT:  Head: Normocephalic and atraumatic.  Right Ear: External ear normal.  Left Ear: External ear normal.  Nose: Nose normal.  Mouth/Throat: Oropharynx is clear and moist.  3 cm laceration down to bone  Eyes: Conjunctivae and EOM are normal. Pupils are equal, round, and reactive to light.  Neck: Normal range of motion. Neck supple. No JVD present. No tracheal deviation present. No thyromegaly present.  Cardiovascular: Normal rate, regular rhythm, normal heart sounds and intact distal pulses.  Exam reveals no gallop and no friction rub.   No murmur heard. Pulmonary/Chest: Effort normal and breath sounds normal. No stridor. No respiratory distress. She has no wheezes. She has no rales. She exhibits no tenderness.  Abdominal: Soft. Bowel sounds are normal. She exhibits no distension and no mass. There is no tenderness. There is no rebound and no guarding.  Musculoskeletal: Normal range of motion. She exhibits no edema or tenderness.  Lymphadenopathy:    She has no cervical adenopathy.  Neurological: She is alert and oriented to person, place, and time. She displays normal reflexes. She exhibits normal muscle tone. Coordination normal.  Skin: Skin is warm and dry. No rash noted. No erythema. No pallor.  Psychiatric: She has a normal mood and affect. Her  behavior is normal. Judgment and thought content normal.  Nursing note and vitals reviewed.   ED Course  Procedures (including critical care time) Labs Review Labs Reviewed - No data to display  Imaging Review No results found.   EKG Interpretation None     LACERATION REPAIR Performed by: Olivia MackieTTER,Danella Philson M Authorized by: Olivia MackieTTER,Garreth Burnsworth M Consent: Verbal consent obtained. Risks and benefits: risks, benefits and alternatives were discussed Consent given by: patient Patient identity confirmed: provided demographic data Prepped and  Draped in normal sterile fashion Wound explored  Laceration Location: Right forehead  Laceration Length: 3 cm  No Foreign Bodies seen or palpated  Anesthesia: local infiltration  Local anesthetic: lidocaine 2 % with epinephrine  Anesthetic total: 4 ml  Irrigation method: syringe Amount of cleaning: standard  Skin closure: 5-0 rapid Vicryl, 5-0 Prolene   Number of sutures: 2 deep sutures, horizontal mattress to approximate skin.  7 simple interrupted of 5-0 Prolene   Technique: As above   Patient tolerance: Patient tolerated the procedure well with no immediate complications.  MDM   Final diagnoses:  Forehead laceration, initial encounter    Dirty 34-year-old female status post fall with forehead laceration.  Laceration repaired.  Patient tolerated well.  Wound dressed with bacitracin.  Tetanus updated.  Instructed to follow-up for suture removal in 5 days.    Marisa Severinlga Delena Casebeer, MD 11/09/14 [phone]

## 2014-11-14 ENCOUNTER — Encounter (HOSPITAL_COMMUNITY): Payer: Self-pay | Admitting: Emergency Medicine

## 2014-11-14 ENCOUNTER — Emergency Department (HOSPITAL_COMMUNITY)
Admission: EM | Admit: 2014-11-14 | Discharge: 2014-11-14 | Disposition: A | Discharge status: Home / Self Care | Payer: Self-pay | Attending: Emergency Medicine | Admitting: Emergency Medicine

## 2014-11-14 DIAGNOSIS — Z4802 Encounter for removal of sutures: Secondary | ICD-10-CM | POA: Insufficient documentation

## 2014-11-14 DIAGNOSIS — Z79899 Other long term (current) drug therapy: Secondary | ICD-10-CM | POA: Insufficient documentation

## 2014-11-14 DIAGNOSIS — Z8719 Personal history of other diseases of the digestive system: Secondary | ICD-10-CM | POA: Insufficient documentation

## 2014-11-14 NOTE — Discharge Instructions (Signed)
May wish to apply neosporin to wound for the next several days.  Once wound is completely healed, may use mederma to help reduce scarring. Follow-up with your primary care physician. Return to the ED for new concerns.

## 2014-11-14 NOTE — ED Provider Notes (Signed)
CSN: [id number]     Arrival date & time 11/14/14  1652 History   First MD Initiated Contact with Patient 11/14/14 1740     Chief Complaint  Patient presents with  . Suture / Staple Removal     (Consider location/radiation/quality/duration/timing/severity/associated sxs/prior Treatment) The history is provided by the patient and medical records.   This is a 34 y.o. F with PMH significant for GERD, seasonal allergies, presenting to the ED for suture removal.  Sutures placed on 11/09/14 after a slip and fall resulted in laceration of her right forehead.  No LOC.  Patient states wound has been healing well, no drainage or redness.  No fever, chills, sweats.  Tetanus updated at previous visit.    Past Medical History  Diagnosis Date  . Allergy     RHINITIS  . GERD (gastroesophageal reflux disease)    Past Surgical History  Procedure Laterality Date  . Vaginal delivery      X 4   Family History  Problem Relation Age of Onset  . Diabetes Mother    History  Substance Use Topics  . Smoking status: Never Smoker   . Smokeless tobacco: Never Used  . Alcohol Use: No   OB History    Gravida Para Term Preterm AB TAB SAB Ectopic Multiple Living   5 5 5       0 1     Review of Systems  Skin: Positive for wound.       Suture removal  All other systems reviewed and are negative.     Allergies  Banana; Fish oil; and Strawberry  Home Medications   Prior to Admission medications   Medication Sig Start Date End Date Taking? Authorizing Provider  HYDROcodone-acetaminophen (NORCO/VICODIN) 5-325 MG per tablet Take 1-2 tablets by mouth every 4 (four) hours as needed for moderate pain or severe pain. Patient not taking: Reported on 08/29/2014 08/23/14   Trixie DredgeEmily West, PA-C  nitrofurantoin, macrocrystal-monohydrate, (MACROBID) 100 MG capsule Take 1 capsule (100 mg total) by mouth 2 (two) times daily. Patient not taking: Reported on 08/29/2014 08/23/14   Trixie DredgeEmily West, PA-C   BP 122/66 mmHg   Pulse 58  Temp(Src) 98.1 F (36.7 C) (Oral)  Resp 18  SpO2 100% Physical Exam  Constitutional: She is oriented to person, place, and time. She appears well-developed and well-nourished.  HENT:  Head: Normocephalic. Head is with laceration.  Mouth/Throat: Oropharynx is clear and moist.  7 prolene sutures in place right forehead, well healed without drainage or erythema; no abscess formation  Eyes: Conjunctivae and EOM are normal. Pupils are equal, round, and reactive to light.  Neck: Normal range of motion.  Cardiovascular: Normal rate, regular rhythm and normal heart sounds.   Pulmonary/Chest: Effort normal and breath sounds normal.  Abdominal: Soft. Bowel sounds are normal.  Musculoskeletal: Normal range of motion.  Neurological: She is alert and oriented to person, place, and time.  Skin: Skin is warm and dry.  Psychiatric: She has a normal mood and affect.  Nursing note and vitals reviewed.   ED Course  SUTURE REMOVAL Date/Time: 11/14/2014 5:49 PM Performed by: Garlon HatchetSANDERS, Caress Reffitt M Authorized by: Garlon HatchetSANDERS, Shaka Cardin M Consent: Verbal consent obtained. Risks and benefits: risks, benefits and alternatives were discussed Consent given by: patient Patient understanding: patient states understanding of the procedure being performed Required items: required blood products, implants, devices, and special equipment available Patient identity confirmed: verbally with patient Body area: head/neck Location details: forehead Wound Appearance: clean Sutures Removed: 7 Post-removal: dressing  applied and antibiotic ointment applied Facility: sutures placed in this facility Patient tolerance: Patient tolerated the procedure well with no immediate complications   (including critical care time) Labs Review Labs Reviewed - No data to display  Imaging Review No results found.   EKG Interpretation None      MDM   Final diagnoses:  Visit for suture removal   Wound well-healed without  signs of infection. Sutures removed without difficulty. Patient started on continued home wound care including mederma to help reduce scarring. She will follow with her PCP for any issues.  Discussed plan with patient, he/she acknowledged understanding and agreed with plan of care.  Return precautions given for new or worsening symptoms.  Garlon Hatchet, PA-C 11/14/14 1820  Mirian Mo, MD 11/15/14 [phone]

## 2014-11-14 NOTE — ED Notes (Signed)
Pt sts here to have suture removed from head

## 2014-12-23 ENCOUNTER — Emergency Department (HOSPITAL_COMMUNITY)
Admission: EM | Admit: 2014-12-23 | Discharge: 2014-12-23 | Disposition: A | Discharge status: Home / Self Care | Payer: Medicaid Other | Attending: Emergency Medicine | Admitting: Emergency Medicine

## 2014-12-23 ENCOUNTER — Encounter (HOSPITAL_COMMUNITY): Payer: Self-pay | Admitting: Emergency Medicine

## 2014-12-23 DIAGNOSIS — Z4801 Encounter for change or removal of surgical wound dressing: Secondary | ICD-10-CM | POA: Insufficient documentation

## 2014-12-23 DIAGNOSIS — Z8709 Personal history of other diseases of the respiratory system: Secondary | ICD-10-CM | POA: Insufficient documentation

## 2014-12-23 DIAGNOSIS — Z8719 Personal history of other diseases of the digestive system: Secondary | ICD-10-CM | POA: Insufficient documentation

## 2014-12-23 DIAGNOSIS — T8130XA Disruption of wound, unspecified, initial encounter: Secondary | ICD-10-CM

## 2014-12-23 NOTE — ED Provider Notes (Signed)
CSN: [id number]     Arrival date & time 12/23/14  1957 History  This chart was scribed for non-physician practitioner, Ebbie Ridge, PA-C,working with Rolland Porter, MD, by Karle Plumber, ED Scribe. This patient was seen in room TR07C/TR07C and the patient's care was started at 8:24 PM.  Chief Complaint  Patient presents with  . Wound Check   Patient is a 34 y.o. female presenting with wound check. The history is provided by the patient and medical records. No language interpreter was used.  Wound Check    HPI Comments:  Diamon Arias is a 34 y.o. female who presents to the Emergency Department needing a wound check. She states she had sutures placed to a laceration of the forehead approximately one month ago. Pt reports the suture is now sticking out of the skin and she wants it removed. She denies any pain. She denies modifying factors. Denies nausea, vomiting, fever or chills. Denies any bleeding or drainage of the area. PMHx of allergic rhinitis and GERD.  Past Medical History  Diagnosis Date  . Allergy     RHINITIS  . GERD (gastroesophageal reflux disease)    Past Surgical History  Procedure Laterality Date  . Vaginal delivery      X 4   Family History  Problem Relation Age of Onset  . Diabetes Mother    History  Substance Use Topics  . Smoking status: Never Smoker   . Smokeless tobacco: Never Used  . Alcohol Use: No   OB History    Gravida Para Term Preterm AB TAB SAB Ectopic Multiple Living   0 1     Review of Systems A complete 10 system review of systems was obtained and all systems are negative except as noted in the HPI and PMH.   Allergies  Banana; Fish oil; and Strawberry  Home Medications   Prior to Admission medications   Medication Sig Start Date End Date Taking? Authorizing Provider  HYDROcodone-acetaminophen (NORCO/VICODIN) 5-325 MG per tablet Take 1-2 tablets by mouth every 4 (four) hours as needed for moderate pain or severe  pain. Patient not taking: Reported on 08/29/2014 08/23/14   Trixie Dredge, PA-C  nitrofurantoin, macrocrystal-monohydrate, (MACROBID) 100 MG capsule Take 1 capsule (100 mg total) by mouth 2 (two) times daily. Patient not taking: Reported on 08/29/2014 08/23/14   Trixie Dredge, PA-C   Triage Vitals: BP 111/62 mmHg  Pulse 62  Temp(Src) 97.7 F (36.5 C) (Oral)  Resp 14  SpO2 99% Physical Exam  Constitutional: She is oriented to person, place, and time. She appears well-developed and well-nourished.  HENT:  Head: Normocephalic and atraumatic.  Eyes: EOM are normal.  Neck: Normal range of motion.  Cardiovascular: Normal rate.   Pulmonary/Chest: Effort normal.  Musculoskeletal: Normal range of motion.  Neurological: She is alert and oriented to person, place, and time.  Skin: Skin is warm and dry.  Psychiatric: She has a normal mood and affect. Her behavior is normal.  Nursing note and vitals reviewed.   ED Course  Procedures (including critical care time) DIAGNOSTIC STUDIES: Oxygen Saturation is 99% on RA, normal by my interpretation.   COORDINATION OF CARE: 8:27 PM- Informed pt the sutures were placed subcutaneously and will dissolve on their own. Will trim part of suture that is protruding. Return precautions discussed. Pt verbalizes understanding and agrees to plan.  I personally performed the services described in this documentation, which was scribed in my presence. The recorded  information has been reviewed and is accurate.    Charlestine NightChristopher Makaley Storts, PA-C 12/23/14 2052  Rolland PorterMark James, MD 01/03/15 [phone]

## 2014-12-23 NOTE — ED Notes (Signed)
Pt. requesting wound check at her forehead laceration sustained 1 month ago , staples removed several weeks ago , laceration healed with no bleeding or drainage.

## 2014-12-23 NOTE — Discharge Instructions (Signed)
Return here as needed.  Follow-up with your primary care doctor as needed

## 2015-01-05 ENCOUNTER — Ambulatory Visit (INDEPENDENT_AMBULATORY_CARE_PROVIDER_SITE_OTHER): Payer: Self-pay | Admitting: Medical

## 2015-01-05 ENCOUNTER — Encounter: Payer: Self-pay | Admitting: Medical

## 2015-01-05 ENCOUNTER — Telehealth: Payer: Self-pay | Admitting: Medical

## 2015-01-05 VITALS — BP 110/70 | HR 78 | Temp 98.0°F | Resp 15 | Wt 186.0 lb

## 2015-01-05 DIAGNOSIS — R319 Hematuria, unspecified: Secondary | ICD-10-CM

## 2015-01-05 DIAGNOSIS — IMO0002 Reserved for concepts with insufficient information to code with codable children: Secondary | ICD-10-CM

## 2015-01-05 DIAGNOSIS — R102 Pelvic and perineal pain: Secondary | ICD-10-CM

## 2015-01-05 DIAGNOSIS — N941 Dyspareunia: Secondary | ICD-10-CM

## 2015-01-05 DIAGNOSIS — R21 Rash and other nonspecific skin eruption: Secondary | ICD-10-CM

## 2015-01-05 DIAGNOSIS — N938 Other specified abnormal uterine and vaginal bleeding: Secondary | ICD-10-CM

## 2015-01-05 DIAGNOSIS — R300 Dysuria: Secondary | ICD-10-CM

## 2015-01-05 LAB — POCT URINALYSIS DIPSTICK
BILIRUBIN UA: NEGATIVE
Glucose, UA: NEGATIVE
Ketones, UA: NEGATIVE
Leukocytes, UA: NEGATIVE
Nitrite, UA: NEGATIVE
Protein, UA: NEGATIVE
RBC UA: NEGATIVE
Spec Grav, UA: >=1.03
Urobilinogen, UA: NEGATIVE
pH, UA: 6

## 2015-01-05 NOTE — Progress Notes (Signed)
Subjective: Here for urinary c/o.  Here with husband who interprets as she speaks limited english.  Over the past week had burning with uriantion, some urine odor, some lower abdominal pain but this cleared up 2 days ago.  Additional she notes pain with intercourse, bleeding with intercourse, sometimes spotting, but hasn't had a period since her child's birth 5 months ago.  Has had 4 other childbirths and didn't start periods until after breast feeding so that isn't unusual, but she has never had pain or bleeding with intercourse.  No vaginal discharge, no fever, no back pain,no other urinary or GI issues.    She notes ongoing skin rash that comes and goes for years.  Wants referral for dermatology.   No current rash today but she has photos.     Past Medical History  Diagnosis Date  . Allergy     RHINITIS  . GERD (gastroesophageal reflux disease)    ROS as in subjective  Objective: BP 110/70 mmHg  Pulse 78  Temp(Src) 98 F (36.7 C) (Oral)  Resp 15  Wt 186 lb (84.369 kg)  Breastfeeding? Yes  Gen: wd, wn, nad Abdomen: +bs soft, nontender, no mass, no organomegaly Back: nontender GU/gyn - declines   Assessment: Encounter Diagnoses  Name Primary?  . Dysuria Yes  . Hematuria   . Rash and nonspecific skin eruption   . Pelvic pain in female   . Dysfunctional uterine bleeding   . Dyspareunia     Plan: UA normal.  Likely resolved UTI.  Declines pelvic exam.  Referral to gynecology for dysfunctional uterine bleeding, pelvic pain, dyspareunia  Rash - referral to dermatology at their request

## 2015-01-05 NOTE — Telephone Encounter (Signed)
Refer to dermatology for skin concerns, preferably female provider.  [facility] about pelvic pain, abnormal uterine bleeding, dyspareunia

## 2015-01-11 NOTE — Telephone Encounter (Signed)
[facility] doesn't except Medicaid.

## 2015-01-16 NOTE — Telephone Encounter (Signed)
See message, refer somewhere else

## 2015-01-18 NOTE — Telephone Encounter (Signed)
[facility] AND THEY WILL CONTACT THE PATIENT FOR HER APPOINTMENT.

## 2015-01-19 ENCOUNTER — Inpatient Hospital Stay (HOSPITAL_COMMUNITY)
Admission: AD | Admit: 2015-01-19 | Discharge: 2015-01-19 | Disposition: A | Discharge status: Home / Self Care | Payer: Medicaid Other | Source: Ambulatory Visit | Attending: Obstetrics & Gynecology | Admitting: Obstetrics & Gynecology

## 2015-01-19 ENCOUNTER — Encounter (HOSPITAL_COMMUNITY): Payer: Self-pay | Admitting: *Deleted

## 2015-01-19 DIAGNOSIS — A5901 Trichomonal vulvovaginitis: Secondary | ICD-10-CM | POA: Insufficient documentation

## 2015-01-19 DIAGNOSIS — N930 Postcoital and contact bleeding: Secondary | ICD-10-CM

## 2015-01-19 LAB — URINALYSIS, ROUTINE W REFLEX MICROSCOPIC
Bilirubin Urine: NEGATIVE
Glucose, UA: NEGATIVE mg/dL
KETONES UR: NEGATIVE mg/dL
LEUKOCYTES UA: NEGATIVE
Nitrite: NEGATIVE
PROTEIN: NEGATIVE mg/dL
SPECIFIC GRAVITY, URINE: 1.02 (ref 1.005–1.030)
Urobilinogen, UA: 0.2 mg/dL (ref 0.0–1.0)
pH: 6 (ref 5.0–8.0)

## 2015-01-19 LAB — POCT PREGNANCY, URINE: Preg Test, Ur: NEGATIVE

## 2015-01-19 LAB — WET PREP, GENITAL
CLUE CELLS WET PREP: NONE SEEN
YEAST WET PREP: NONE SEEN

## 2015-01-19 LAB — URINE MICROSCOPIC-ADD ON

## 2015-01-19 MED ORDER — METRONIDAZOLE 500 MG PO TABS
2000.0000 mg | ORAL_TABLET | Freq: Once | ORAL | Status: AC
  Administered 2015-01-19: 2000 mg via ORAL
  Filled 2015-01-19: qty 4
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
## 2015-01-19 NOTE — Discharge Instructions (Signed)
Trichomoniasis Trichomoniasis is an infection caused by an organism called Trichomonas. The infection can affect both women and men. In women, the outer female genitalia and the vagina are affected. In men, the penis is mainly affected, but the prostate and other reproductive organs can also be involved. Trichomoniasis is a sexually transmitted infection (STI) and is most often passed to another person through sexual contact.  RISK FACTORS  Having unprotected sexual intercourse.  Having sexual intercourse with an infected partner. SIGNS AND SYMPTOMS  Symptoms of trichomoniasis in women include:  Abnormal gray-green frothy vaginal discharge.  Itching and irritation of the vagina.  Itching and irritation of the area outside the vagina. Symptoms of trichomoniasis in men include:   Penile discharge with or without pain.  Pain during urination. This results from inflammation of the urethra. DIAGNOSIS  Trichomoniasis may be found during a Pap test or physical exam. Your health care provider may use one of the following methods to help diagnose this infection:  Examining vaginal discharge under a microscope. For men, urethral discharge would be examined.  Testing the pH of the vagina with a test tape.  Using a vaginal swab test that checks for the Trichomonas organism. A test is available that provides results within a few minutes.  Doing a culture test for the organism. This is not usually needed. TREATMENT   You may be given medicine to fight the infection. Women should inform their health care provider if they could be or are pregnant. Some medicines used to treat the infection should not be taken during pregnancy.  Your health care provider may recommend over-the-counter medicines or creams to decrease itching or irritation.  Your sexual partner will need to be treated if infected. HOME CARE INSTRUCTIONS   Take medicines only as directed by your health care provider.  Take  over-the-counter medicine for itching or irritation as directed by your health care provider.  Do not have sexual intercourse while you have the infection.  Women should not douche or wear tampons while they have the infection.  Discuss your infection with your partner. Your partner may have gotten the infection from you, or you may have gotten it from your partner.  Have your sex partner get examined and treated if necessary.  Practice safe, informed, and protected sex.  See your health care provider for other STI testing. SEEK MEDICAL CARE IF:   You still have symptoms after you finish your medicine.  You develop abdominal pain.  You have pain when you urinate.  You have bleeding after sexual intercourse.  You develop a rash.  Your medicine makes you sick or makes you throw up (vomit). MAKE SURE YOU:  Understand these instructions.  Will watch your condition.  Will get help right away if you are not doing well or get worse. Document Released: 02/12/2001 Document Revised: 01/03/2014 Document Reviewed: 05/31/2013 [facility] Patient Information 2015 [place], [place]. This information is not intended to replace advice given to you by your health care provider. Make sure you discuss any questions you have with your health care provider.  Abnormal Uterine Bleeding Abnormal uterine bleeding can affect women at various stages in life, including teenagers, women in their reproductive years, pregnant women, and women who have reached menopause. Several kinds of uterine bleeding are considered abnormal, including:  Bleeding or spotting between periods.   Bleeding after sexual intercourse.   Bleeding that is heavier or more than normal.   Periods that last longer than usual.  Bleeding after menopause.  Many cases of abnormal uterine bleeding are minor and simple to treat, while others are more serious. Any type of abnormal bleeding should be evaluated by your health care  provider. Treatment will depend on the cause of the bleeding. HOME CARE INSTRUCTIONS Monitor your condition for any changes. The following actions may help to alleviate any discomfort you are experiencing:  Avoid the use of tampons and douches as directed by your health care provider.  Change your pads frequently. You should get regular pelvic exams and Pap tests. Keep all follow-up appointments for diagnostic tests as directed by your health care provider.  SEEK MEDICAL CARE IF:   Your bleeding lasts more than 1 week.   You feel dizzy at times.  SEEK IMMEDIATE MEDICAL CARE IF:   You pass out.   You are changing pads every 15 to 30 minutes.   You have abdominal pain.  You have a fever.   You become sweaty or weak.   You are passing large blood clots from the vagina.   You start to feel nauseous and vomit. MAKE SURE YOU:   Understand these instructions.  Will watch your condition.  Will get help right away if you are not doing well or get worse. Document Released: 08/19/2005 Document Revised: 08/24/2013 Document Reviewed: 03/18/2013 [facility] Patient Information 2015 [place]. This information is not intended to replace advice given to you by your health care provider. Make sure you discuss any questions you have with your health care provider.

## 2015-01-19 NOTE — MAU Note (Addendum)
PT   SAYS   SHE DEL VAG  ON 08-10-2014.   BREASTFEEDING-   NO CYCLE.    HAD VAG  BLEEDING   OFF/ ON    IN MARCH     AFTER HAD  SEX.     THEN  IN April  SAME ... THEN AGAIN IN June.     STARTED BLEEDING   AGAIN  TODAY.    SHE  WENT  TO SEE DR LALONDE  ON 5-9-   HE TOLD HER  TO SEE GYN  DR.    DID NOT CALL [facility]-  SHE CALLED GYN  CLINIC  -    SO APPOINTMENT   IS 6-15.     NO PAD ON IN TRIAGE -  SHE SEES  BLOOD  WHEN  SHE WIPES.     DR Susann GivensLALONDE   DID UPT   NEG.   NO BIRTH  CONTROL    NO CRAMPING.    HAS PAIN IN PERNIEUM         LAST SEX-  5-18

## 2015-01-20 LAB — GC/CHLAMYDIA PROBE AMP (~~LOC~~) NOT AT ARMC
Chlamydia: NEGATIVE
Neisseria Gonorrhea: NEGATIVE

## 2015-02-14 NOTE — MAU Note (Signed)
Pt's husband here, needing rx. Prior APP note reviewed, referred to [facility]

## 2015-02-15 ENCOUNTER — Encounter: Payer: Self-pay | Admitting: *Deleted

## 2015-02-15 ENCOUNTER — Ambulatory Visit (INDEPENDENT_AMBULATORY_CARE_PROVIDER_SITE_OTHER): Payer: Medicaid Other | Admitting: Obstetrics & Gynecology

## 2015-02-15 ENCOUNTER — Encounter: Payer: Self-pay | Admitting: Obstetrics & Gynecology

## 2015-02-15 VITALS — BP 135/82 | HR 86 | Temp 98.8°F | Ht 65.0 in | Wt 182.5 lb

## 2015-02-15 DIAGNOSIS — Z308 Encounter for other contraceptive management: Secondary | ICD-10-CM | POA: Diagnosis not present

## 2015-02-15 DIAGNOSIS — Z3202 Encounter for pregnancy test, result negative: Secondary | ICD-10-CM

## 2015-02-15 LAB — POCT PREGNANCY, URINE: Preg Test, Ur: NEGATIVE

## 2015-02-15 NOTE — Progress Notes (Addendum)
[employer] interpreter # [phone], when asked pt concerning about her appt was about bleeding after intercourse pt stated that she was fine and she was not having any bleeding.  Pt stated that her appt today was suppose to be for [facility]  I asked pt when was the last time she had unprotected intercourse she stated a week ago.  Per Dr. Macon Large, pt will need to get a pregnancy test result negative today and wear a condom or abstain from intercourse then she will need to come back in two weeks for another pregnancy test before getting BC.  Pt stated ok with no further questions.

## 2015-03-08 ENCOUNTER — Encounter: Payer: Self-pay | Admitting: Obstetrics & Gynecology

## 2015-03-08 ENCOUNTER — Ambulatory Visit (INDEPENDENT_AMBULATORY_CARE_PROVIDER_SITE_OTHER): Payer: Medicaid Other | Admitting: Obstetrics & Gynecology

## 2015-03-08 VITALS — BP 118/72 | HR 76 | Temp 98.4°F | Resp 20 | Ht 64.0 in | Wt 181.8 lb

## 2015-03-08 DIAGNOSIS — Z30018 Encounter for initial prescription of other contraceptives: Secondary | ICD-10-CM | POA: Diagnosis not present

## 2015-03-08 DIAGNOSIS — Z3202 Encounter for pregnancy test, result negative: Secondary | ICD-10-CM

## 2015-03-08 DIAGNOSIS — Z3049 Encounter for surveillance of other contraceptives: Secondary | ICD-10-CM

## 2015-03-08 DIAGNOSIS — A5901 Trichomonal vulvovaginitis: Secondary | ICD-10-CM

## 2015-03-08 DIAGNOSIS — Z30017 Encounter for initial prescription of implantable subdermal contraceptive: Secondary | ICD-10-CM

## 2015-03-08 LAB — POCT PREGNANCY, URINE: PREG TEST UR: NEGATIVE

## 2015-03-08 MED ORDER — ETONOGESTREL 68 MG ~~LOC~~ IMPL
68.0000 mg | DRUG_IMPLANT | Freq: Once | SUBCUTANEOUS | Status: AC
  Administered 2015-03-08: 68 mg via SUBCUTANEOUS

## 2015-03-08 NOTE — Progress Notes (Signed)
     GYNECOLOGY CLINIC PROCEDURE NOTE  Denise Arias is a 34 y.o. [id number] here for Nexplanon insertion.  Was diagnosed with Trichomonas on 01/19/15 and was treated, desires test of cure. Currently on period.  No other gynecologic concerns.  Nexplanon Insertion Procedure Patient identified, informed consent performed, consent signed.  Urine HCG negative today.  Patient does understand that irregular bleeding is a very common side effect of this medication. She was advised to have backup contraception for one week after placement. Pregnancy test in clinic today was negative.  Appropriate time out taken.  Patient's left arm was prepped and draped in the usual sterile fashion.. The ruler used to measure and mark insertion area.  Patient was prepped with alcohol swab and then injected with 3 ml of 1% lidocaine.  She was prepped with betadine, Nexplanon removed from packaging,  Device confirmed in needle, then inserted full length of needle and withdrawn per handbook instructions. Nexplanon was able to palpated in the patient's arm; patient palpated the insert herself. There was minimal blood loss.  Patient insertion site covered with guaze and a pressure bandage to reduce any bruising.  The patient tolerated the procedure well and was given post procedure instructions.   Of note, wet prep was done for Trichomonas TOC; was diagnosed and treated on 01/19/15.  Jaynie CollinsUGONNA  Kandon Hosking, MD, FACOG Attending Obstetrician & Gynecologist Center [facility], [facility]

## 2015-03-08 NOTE — Patient Instructions (Signed)
Return to clinic for any scheduled appointments or for any gynecologic concerns as needed.   

## 2015-03-08 NOTE — Progress Notes (Signed)
Pt declines scheduled interpreter for today because they are friends.  Pt speaks very good English and had no difficulty understanding this RN during nurse interview.  [facility].   Pt states she is here to receive birth control. She had unprotected intercourse last week, now has period.  She also wants TOC for trich - had +wet prep on 5/19 and took flagyl as prescribed.

## 2015-03-09 LAB — WET PREP, GENITAL
TRICH WET PREP: NONE SEEN
WBC, Wet Prep HPF POC: NONE SEEN
Yeast Wet Prep HPF POC: NONE SEEN

## 2015-03-13 ENCOUNTER — Telehealth: Payer: Self-pay | Admitting: Medical

## 2015-03-13 NOTE — Telephone Encounter (Signed)
Pt notes that we never made the referral to dermatology from her visit in May.   Please refer to Dermatology.  This is the second time I have asked about this I know.  Prior message from me was along with gyn referral but it doesn't appear the derm referral was made.   Of note, I think she only has family planning medicaid, so this may end up being cash pay.

## 2015-03-15 NOTE — Telephone Encounter (Signed)
I fax over the patients referral to [facility] Waiting on appointment    [facility]  Website  Directions  [facility]  Address: [address], [place], [place] [address]  Phone: [phone]

## 2015-07-05 ENCOUNTER — Encounter (HOSPITAL_COMMUNITY): Payer: Self-pay

## 2015-07-05 ENCOUNTER — Inpatient Hospital Stay (HOSPITAL_COMMUNITY)
Admission: AD | Admit: 2015-07-05 | Discharge: 2015-07-05 | Disposition: A | Discharge status: Home / Self Care | Payer: Self-pay | Source: Ambulatory Visit | Attending: Obstetrics and Gynecology | Admitting: Obstetrics and Gynecology

## 2015-07-05 DIAGNOSIS — F329 Major depressive disorder, single episode, unspecified: Secondary | ICD-10-CM | POA: Insufficient documentation

## 2015-07-05 DIAGNOSIS — F32A Depression, unspecified: Secondary | ICD-10-CM

## 2015-07-05 DIAGNOSIS — N898 Other specified noninflammatory disorders of vagina: Secondary | ICD-10-CM | POA: Insufficient documentation

## 2015-07-05 LAB — URINALYSIS, ROUTINE W REFLEX MICROSCOPIC
Bilirubin Urine: NEGATIVE
GLUCOSE, UA: NEGATIVE mg/dL
KETONES UR: NEGATIVE mg/dL
LEUKOCYTES UA: NEGATIVE
NITRITE: NEGATIVE
PH: 6 (ref 5.0–8.0)
Protein, ur: NEGATIVE mg/dL
SPECIFIC GRAVITY, URINE: 1.02 (ref 1.005–1.030)
Urobilinogen, UA: 0.2 mg/dL (ref 0.0–1.0)

## 2015-07-05 LAB — URINE MICROSCOPIC-ADD ON

## 2015-07-05 LAB — WET PREP, GENITAL
CLUE CELLS WET PREP: NONE SEEN
TRICH WET PREP: NONE SEEN
YEAST WET PREP: NONE SEEN

## 2015-07-05 LAB — POCT PREGNANCY, URINE: Preg Test, Ur: NEGATIVE

## 2015-07-05 MED ORDER — SERTRALINE HCL 50 MG PO TABS: 50.0000 mg | ORAL_TABLET | Freq: Every day | ORAL | Status: DC

## 2015-07-05 NOTE — Discharge Instructions (Signed)
Suicidal Feelings: How to Help Yourself °Suicide is the taking of one's own life. If you feel as though life is getting too tough to handle and are thinking about suicide, get help right away. To get help: °· Call your local emergency services (911 in the [place] °· Call a suicide hotline to speak with a trained counselor who understands how you are feeling. The following is a list of suicide hotlines in the [place]. For a list of hotlines in [place], visit www.suicide.org/hotlines/international/[place]-suicide-hotlines.html. °¨  1-800-273-TALK (1-800-273-8255). °¨  1-800-SUICIDE (1-800-784-2433). °¨  1-888-628-9454. This is a hotline for Spanish speakers. °¨  1-800-799-4TTY (1-800-799-4889). This is a hotline for TTY users. °¨  1-866-4-U-TREVOR (1-866-488-7386). This is a hotline for lesbian, gay, bisexual, transgender, or questioning youth. °· Contact a crisis center or a local suicide prevention center. To find a crisis center or suicide prevention center: °¨ Call your local hospital, clinic, community service organization, mental health center, social service provider, or health department. Ask for assistance in connecting to a crisis center. °¨ Visit www.suicidepreventionlifeline.org/getinvolved/locator for a list of crisis centers in the [place], or visit www.suicideprevention.ca/thinking-about-suicide/find-a-crisis-centre for a list of centers in [place]. °· Visit the following websites: °¨  National Suicide Prevention Lifeline: www.suicidepreventionlifeline.org °¨  Hopeline: www.hopeline.com °¨  American Foundation for Suicide Prevention: www.afsp.org °¨  The Trevor Project (for lesbian, gay, bisexual, transgender, or questioning youth): www.thetrevorproject.org °HOW CAN I HELP MYSELF FEEL BETTER? °· Promise yourself that you will not do anything drastic when you have suicidal feelings. Remember, there is hope. Many people have gotten through suicidal thoughts and feelings, and you will, too. You may  have gotten through them before, and this proves that you can get through them again. °· Let family, friends, teachers, or counselors know how you are feeling. Try not to isolate yourself from those who care about you. Remember, they will want to help you. Talk with someone every day, even if you do not feel sociable. Face-to-face conversation is best. °· Call a mental health professional and see one regularly. °· Visit your primary health care provider every year. °· Eat a well-balanced diet, and space your meals so you eat regularly. °· Get plenty of rest. °· Avoid alcohol and drugs, and remove them from your home. They will only make you feel worse. °· If you are thinking of taking a lot of medicine, give your medicine to someone who can give it to you one day at a time. If you are on antidepressants and are concerned you will overdose, let your health care provider know so he or she can give you safer medicines. Ask your mental health professional about the possible side effects of any medicines you are taking. °· Remove weapons, poisons, knives, and anything else that could harm you from your home. °· Try to stick to routines. Follow a schedule every day. Put self-care on your schedule. °· Make a list of realistic goals, and cross them off when you achieve them. Accomplishments give a sense of worth. °· Wait until you are feeling better before doing the things you find difficult or unpleasant. °· Exercise if you are able. You will feel better if you exercise for even a half hour each day. °· Go out in the sun or into nature. This will help you recover from depression faster. If you have a favorite place to walk, go there. °· Do the things that have always given you pleasure. Play your favorite music, read a good book, paint a picture, play your favorite instrument, or do anything   else that takes your mind off your depression if it is safe to do. °· Keep your living space well lit. °· When you are feeling well,  write yourself a letter about tips and support that you can read when you are not feeling well. °· Remember that life's difficulties can be sorted out with help. Conditions can be treated. You can work on thoughts and strategies that serve you well. °  °This information is not intended to replace advice given to you by your health care provider. Make sure you discuss any questions you have with your health care provider. °  °Document Released: 02/23/2003 Document Revised: 09/09/2014 Document Reviewed: 12/14/2013 °Elsevier Interactive Patient Education ©2016 [employer] ° °

## 2015-07-05 NOTE — MAU Provider Note (Signed)
History     CSN: [id number]  Arrival date and time: 07/05/15 1646   First Provider Initiated Contact with Patient 07/05/15 1740      Chief Complaint  Patient presents with  . Vaginal Bleeding  . Vaginal Discharge   HPI   Ms. Denise Arias is a 34 y.o.  Female [id number] presenting with vaginal discharge. She has been treated for an STD in the past and feels that her husband has continued to have sex with other women.  She complains of abnormal vaginal spotting after sex. She notices that after sex she sees spots of blood on occasion.  She does have lower abdominal pain during intercourse, however not after intercourse. She denies pain currently.   She feels sad about her husband cheating, she wants counseling and she would like medication for depression. She denies thoughts of harm to herself or her children. She does not want to harm herself because she wants to live for her children, however she does not like the feeling of sadness.   OB History    Gravida Para Term Preterm AB TAB SAB Ectopic Multiple Living   5 5 5       0 5      Past Medical History  Diagnosis Date  . Allergy     RHINITIS  . GERD (gastroesophageal reflux disease)     Past Surgical History  Procedure Laterality Date  . Vaginal delivery      X 4    Family History  Problem Relation Age of Onset  . Diabetes Mother     Social History  Substance Use Topics  . Smoking status: Never Smoker   . Smokeless tobacco: Never Used  . Alcohol Use: No    Allergies:  Allergies  Allergen Reactions  . Multivitamins Swelling    sd her stomach/intestinal area swelled up after 2 hrs.  . Banana Hives  . Fish Oil Rash  . Strawberry Extract Rash    Prescriptions prior to admission  Medication Sig Dispense Refill Last Dose  . diphenhydrAMINE (BENADRYL) 25 mg capsule Take 25 mg by mouth every 6 (six) hours as needed.   07/04/2015 at Unknown time  . HYDROcodone-acetaminophen (NORCO/VICODIN) 5-325 MG per tablet Take  1-2 tablets by mouth every 4 (four) hours as needed for moderate pain or severe pain. (Patient not taking: Reported on 08/29/2014) 15 tablet 0 Not Taking   Results for orders placed or performed during the hospital encounter of 07/05/15 (from the past 24 hour(s))  Urinalysis, Routine w reflex microscopic (not [facility])     Status: Abnormal   Collection Time: 07/05/15  5:10 PM  Result Value Ref Range   Color, Urine YELLOW YELLOW   APPearance CLEAR CLEAR   Specific Gravity, Urine 1.020 1.005 - 1.030   pH 6.0 5.0 - 8.0   Glucose, UA NEGATIVE NEGATIVE mg/dL   Hgb urine dipstick TRACE (A) NEGATIVE   Bilirubin Urine NEGATIVE NEGATIVE   Ketones, ur NEGATIVE NEGATIVE mg/dL   Protein, ur NEGATIVE NEGATIVE mg/dL   Urobilinogen, UA 0.2 0.0 - 1.0 mg/dL   Nitrite NEGATIVE NEGATIVE   Leukocytes, UA NEGATIVE NEGATIVE  Urine microscopic-add on     Status: Abnormal   Collection Time: 07/05/15  5:10 PM  Result Value Ref Range   Squamous Epithelial / LPF FEW (A) RARE   WBC, UA 0-2 <3 WBC/hpf   RBC / HPF 0-2 <3 RBC/hpf  Pregnancy, urine POC     Status: None   Collection Time: 07/05/15  5:16 PM  Result Value Ref Range   Preg Test, Ur NEGATIVE NEGATIVE  Wet prep, genital     Status: Abnormal   Collection Time: 07/05/15  5:51 PM  Result Value Ref Range   Yeast Wet Prep HPF POC NONE SEEN NONE SEEN   Trich, Wet Prep NONE SEEN NONE SEEN   Clue Cells Wet Prep HPF POC NONE SEEN NONE SEEN   WBC, Wet Prep HPF POC FEW (A) NONE SEEN    Review of Systems  Gastrointestinal: Negative for nausea, vomiting and abdominal pain.  Psychiatric/Behavioral: Positive for depression. Negative for suicidal ideas (Not currently ).   Physical Exam   Blood pressure 135/96, last menstrual period 01/01/2015, currently breastfeeding.  Physical Exam  Constitutional: She is oriented to person, place, and time. She appears well-developed and well-nourished. No distress.  Genitourinary:  Speculum exam: Vagina - Small amount  of creamy discharge, no odor Cervix - No contact bleeding Bimanual exam: Cervix closed, no CMT  Uterus non tender, normal size Adnexa non tender, no masses bilaterally GC/Chlam, wet prep done Chaperone present for exam.  Musculoskeletal: Normal range of motion.  Neurological: She is alert and oriented to person, place, and time.  Skin: Skin is warm. She is not diaphoretic.  Psychiatric: Her behavior is normal. Her mood appears not anxious. Her affect is not angry and not inappropriate. Thought content is not paranoid and not delusional. She does not express inappropriate judgment. She exhibits a depressed mood. She expresses no homicidal and no suicidal ideation. She expresses no suicidal plans and no homicidal plans.    MAU Course  Procedures  None  MDM  Wet prep GC HIV pending   Assessment and Plan   A:  1. Vaginal discharge   2. Depression (emotion)    P:  Discharge home in stable condition RX: Zoloft Follow up with [facility] for RX refill  Suicide hotline given; multiple resources provided to the patient Return to MAU if symptoms worsen Condoms always.  Support given   Duane Lope, NP 07/05/2015 5:45 PM

## 2015-07-05 NOTE — MAU Note (Signed)
Pt states that she would like information on counseling resources; expressing that she has been feeling "down and sad" lately due to the fact that she believes her husband is still seeing another woman. Expresses that she has no plans to harm herself or anyone else, but would like to have some information to talk to someone about her feelings.

## 2015-07-05 NOTE — MAU Note (Signed)
Pt presents to MAU stating that she wants to make sure she does not have any infection. PT was treated for STD in May. Denies any pain

## 2015-07-06 LAB — HIV ANTIBODY (ROUTINE TESTING W REFLEX): HIV Screen 4th Generation wRfx: NONREACTIVE

## 2015-07-07 LAB — GC/CHLAMYDIA PROBE AMP (~~LOC~~) NOT AT ARMC
CHLAMYDIA, DNA PROBE: NEGATIVE
Neisseria Gonorrhea: NEGATIVE

## 2015-10-18 ENCOUNTER — Ambulatory Visit (INDEPENDENT_AMBULATORY_CARE_PROVIDER_SITE_OTHER): Payer: BLUE CROSS/BLUE SHIELD | Admitting: Medical

## 2015-10-18 ENCOUNTER — Encounter: Payer: Self-pay | Admitting: Medical

## 2015-10-18 VITALS — BP 128/80 | HR 69 | Temp 99.0°F | Wt 189.0 lb

## 2015-10-18 DIAGNOSIS — G479 Sleep disorder, unspecified: Secondary | ICD-10-CM | POA: Diagnosis not present

## 2015-10-18 DIAGNOSIS — G43709 Chronic migraine without aura, not intractable, without status migrainosus: Secondary | ICD-10-CM | POA: Diagnosis not present

## 2015-10-18 DIAGNOSIS — F458 Other somatoform disorders: Secondary | ICD-10-CM | POA: Diagnosis not present

## 2015-10-18 DIAGNOSIS — R51 Headache: Secondary | ICD-10-CM | POA: Diagnosis not present

## 2015-10-18 DIAGNOSIS — R198 Other specified symptoms and signs involving the digestive system and abdomen: Secondary | ICD-10-CM

## 2015-10-18 DIAGNOSIS — G8929 Other chronic pain: Secondary | ICD-10-CM

## 2015-10-18 DIAGNOSIS — R0989 Other specified symptoms and signs involving the circulatory and respiratory systems: Secondary | ICD-10-CM

## 2015-10-18 DIAGNOSIS — J3489 Other specified disorders of nose and nasal sinuses: Secondary | ICD-10-CM | POA: Diagnosis not present

## 2015-10-18 MED ORDER — DIPHENHYDRAMINE HCL 25 MG PO CAPS: 25.0000 mg | ORAL_CAPSULE | Freq: Four times a day (QID) | ORAL | Status: DC | PRN

## 2015-10-18 MED ORDER — TOPIRAMATE 25 MG PO TABS: 25.0000 mg | ORAL_TABLET | Freq: Every day | ORAL | Status: DC

## 2015-10-18 NOTE — Progress Notes (Signed)
Subjective: Chief Complaint  Patient presents with  . Sore Throat    started last week. said it feels heavy and gummy. has to spit a lot especially at night. is having migraines, happens when she showers   Here for mucous in throat.   For the past week having mucous in throat, heavy, worse at night.  Has had some cough.  No sneezing, no runny nose, no sore throat, no ear pain.  Has some itchy eyes.  Not using any medication for symptoms.  No sick contacts.   Not drinking a lot of water or liquid in general.  Denies GERD, heartburn.  Has chronic headaches since 2006.   Usually left sided, throbbing, usually +photophobia and phonophobia.   Headaches typically last 1-2 days.  Frequency is usually a few headaches per month.   Using oil on the head helps, sometimes has to tie the head scarf tighter.  Triggers usually stress, lack of sleep. Has 1yo daughter.   Saw headache specialist by referral from health dept few years back.  Had imaging of head.     Past Medical History  Diagnosis Date  . Allergy     RHINITIS  . GERD (gastroesophageal reflux disease)    ROS as in subjective   Objective: BP 128/80 mmHg  Pulse 69  Temp(Src) 99 F (37.2 C) (Tympanic)  Wt 189 lb (85.73 kg)  Breastfeeding? No  General appearance: alert, no distress, WD/WN HEENT: normocephalic, sclerae anicteric, PERRLA, EOMi, nares patent, no discharge or erythema, pharynx normal Oral cavity: MMM, no lesions Neck: supple, no lymphadenopathy, no thyromegaly, no masses Heart: RRR, normal S1, S2, no murmurs Lungs: CTA bilaterally, no wheezes, rhonchi, or rales Extremities: no edema, no cyanosis, no clubbing Pulses: 2+ symmetric, upper and lower extremities, normal cap refill Neurological: alert, oriented x 3, CN2-12 intact, strength normal upper extremities and lower extremities, sensation normal throughout, DTRs 2+ throughout, no cerebellar signs, gait normal Psychiatric: normal affect, behavior normal, pleasant      Assessment: Encounter Diagnoses  Name Primary?  . Thick nasal mucus Yes  . Globus sensation   . Chronic nonintractable headache, unspecified headache type   . Sleep disturbance   . Chronic migraine without aura without status migrainosus, not intractable       Plan: Reviewed her 2012 head CT, negative.  Symptoms suggest migraines, likely related to poor sleep hygiene and stress.  discussed working on sleep hygiene, consistency with sleep and behavioral changes in the household.   Begin Topamax as preventative headache medications, discussed risks/benefits of medication. Begin benadryl QHS daily, increase water intake.  F/u 18mo, sooner prn.  Patient Instructions  Recommendations:   Chronic Headaches/Migraines  Your headache symptom are consistent with migraines.  I recommend you work on getting a better sleep schedule  I recommend gradually getting your children to sleep through the night so they and YOU can get better sleep  I recommend you begin a medication called Topamax nightly to help prevent migraines   Recheck in 1 month on headaches   Mucous in Throat  Your symptom suggest mucous in the throat which can be related to allergies  I recommend you increase your water intake  I recommend beginning Benadryl at bedtime to help with mucous and sleep

## 2015-10-18 NOTE — Patient Instructions (Signed)
Recommendations:   Chronic Headaches/Migraines  Your headache symptom are consistent with migraines.  I recommend you work on getting a better sleep schedule  I recommend gradually getting your children to sleep through the night so they and YOU can get better sleep  I recommend you begin a medication called Topamax nightly to help prevent migraines   Recheck in 1 month on headaches   Mucous in Throat  Your symptom suggest mucous in the throat which can be related to allergies  I recommend you increase your water intake  I recommend beginning Benadryl at bedtime to help with mucous and sleep

## 2015-12-05 ENCOUNTER — Emergency Department (HOSPITAL_COMMUNITY)
Admission: EM | Admit: 2015-12-05 | Discharge: 2015-12-05 | Disposition: A | Discharge status: Home / Self Care | Payer: Medicaid Other | Attending: Emergency Medicine | Admitting: Emergency Medicine

## 2015-12-05 DIAGNOSIS — R51 Headache: Secondary | ICD-10-CM | POA: Insufficient documentation

## 2015-12-05 DIAGNOSIS — Z79899 Other long term (current) drug therapy: Secondary | ICD-10-CM | POA: Insufficient documentation

## 2015-12-05 DIAGNOSIS — Z8719 Personal history of other diseases of the digestive system: Secondary | ICD-10-CM | POA: Insufficient documentation

## 2015-12-05 DIAGNOSIS — Z3202 Encounter for pregnancy test, result negative: Secondary | ICD-10-CM | POA: Insufficient documentation

## 2015-12-05 DIAGNOSIS — R519 Headache, unspecified: Secondary | ICD-10-CM

## 2015-12-05 LAB — BASIC METABOLIC PANEL
ANION GAP: 8 (ref 5–15)
BUN: 12 mg/dL (ref 6–20)
CALCIUM: 9.1 mg/dL (ref 8.9–10.3)
CHLORIDE: 106 mmol/L (ref 101–111)
CO2: 23 mmol/L (ref 22–32)
CREATININE: 0.52 mg/dL (ref 0.44–1.00)
GFR calc non Af Amer: >60 mL/min (ref >60)
Glucose, Bld: 88 mg/dL (ref 65–99)
Potassium: 3.8 mmol/L (ref 3.5–5.1)
SODIUM: 137 mmol/L (ref 135–145)

## 2015-12-05 LAB — CBC WITH DIFFERENTIAL/PLATELET
BASOS ABS: 0 10*3/uL (ref 0.0–0.1)
BASOS PCT: 0 %
EOS ABS: 0.1 10*3/uL (ref 0.0–0.7)
Eosinophils Relative: 2 %
HEMATOCRIT: 38.3 % (ref 36.0–46.0)
HEMOGLOBIN: 12.5 g/dL (ref 12.0–15.0)
Lymphocytes Relative: 28 %
Lymphs Abs: 1.9 10*3/uL (ref 0.7–4.0)
MCH: 28.7 pg (ref 26.0–34.0)
MCHC: 32.6 g/dL (ref 30.0–36.0)
MCV: 88 fL (ref 78.0–100.0)
MONOS PCT: 9 %
Monocytes Absolute: 0.6 10*3/uL (ref 0.1–1.0)
NEUTROS ABS: 4.2 10*3/uL (ref 1.7–7.7)
NEUTROS PCT: 61 %
Platelets: 222 10*3/uL (ref 150–400)
RBC: 4.35 MIL/uL (ref 3.87–5.11)
RDW: 12.6 % (ref 11.5–15.5)
WBC: 6.7 10*3/uL (ref 4.0–10.5)

## 2015-12-05 LAB — I-STAT BETA HCG BLOOD, ED (MC, WL, AP ONLY)

## 2015-12-05 MED ORDER — DIPHENHYDRAMINE HCL 50 MG/ML IJ SOLN
12.5000 mg | Freq: Once | INTRAMUSCULAR | Status: AC
  Administered 2015-12-05: 12.5 mg via INTRAVENOUS
  Filled 2015-12-05: qty 1

## 2015-12-05 MED ORDER — METOCLOPRAMIDE HCL 5 MG/ML IJ SOLN
10.0000 mg | Freq: Once | INTRAMUSCULAR | Status: AC
  Administered 2015-12-05: 10 mg via INTRAVENOUS
  Filled 2015-12-05: qty 2

## 2015-12-05 MED ORDER — SODIUM CHLORIDE 0.9 % IV BOLUS (SEPSIS)
1000.0000 mL | Freq: Once | INTRAVENOUS | Status: AC
  Administered 2015-12-05: 1000 mL via INTRAVENOUS

## 2015-12-05 NOTE — ED Notes (Signed)
Pt has returned to waiting room.

## 2015-12-05 NOTE — Discharge Instructions (Signed)
1. Medications: usual home medications 2. Treatment: rest, drink plenty of fluids 3. Follow Up: please followup with your primary doctor for discussion of your diagnoses and further evaluation after today's visit; please return to the ER for severe headache, vision changes, dizziness, new or worsening symptoms   Migraine Headache A migraine headache is very bad, throbbing pain on one or both sides of your head. Talk to your doctor about what things may bring on (trigger) your migraine headaches. HOME CARE  Only take medicines as told by your doctor.  Lie down in a dark, quiet room when you have a migraine.  Keep a journal to find out if certain things bring on migraine headaches. For example, write down:  What you eat and drink.  How much sleep you get.  Any change to your diet or medicines.  Lessen how much alcohol you drink.  Quit smoking if you smoke.  Get enough sleep.  Lessen any stress in your life.  Keep lights dim if bright lights bother you or make your migraines worse. GET HELP RIGHT AWAY IF:   Your migraine becomes really bad.  You have a fever.  You have a stiff neck.  You have trouble seeing.  Your muscles are weak, or you lose muscle control.  You lose your balance or have trouble walking.  You feel like you will pass out (faint), or you pass out.  You have really bad symptoms that are different than your first symptoms. MAKE SURE YOU:   Understand these instructions.  Will watch your condition.  Will get help right away if you are not doing well or get worse.   This information is not intended to replace advice given to you by your health care provider. Make sure you discuss any questions you have with your health care provider.   Document Released: 05/28/2008 Document Revised: 11/11/2011 Document Reviewed: 04/26/2013 Elsevier Interactive Patient Education [employer]2016 Elsevier Inc.

## 2015-12-05 NOTE — ED Notes (Signed)
Onset 5 days headache.  No relief in pain with Tylenol.   NO blurred vision, nausea, vomiting, light sensitivity.

## 2015-12-05 NOTE — ED Provider Notes (Signed)
CSN: [id number]     Arrival date & time 12/05/15  1734 History   First MD Initiated Contact with Patient 12/05/15 2038     Chief Complaint  Patient presents with  . Headache    HPI   Denise Arias is a 35 y.o. female with a PMH of allergies, GERD, migraines who presents to the ED with headache, which she states has been present for the past 5 days. She notes her symptoms are intermittent. She denies exacerbating factors. She has tried tylenol at home with no significant symptom relief. She states she has a history of headaches and that her symptoms feel characteristic of her typical migraine. She denies dizziness, lightheadedness, vision changes, photophobia, phonophobia, nausea, vomiting, numbness, weakness, paresthesia.   Past Medical History  Diagnosis Date  . Allergy     RHINITIS  . GERD (gastroesophageal reflux disease)    Past Surgical History  Procedure Laterality Date  . Vaginal delivery      X 4   Family History  Problem Relation Age of Onset  . Diabetes Mother    Social History  Substance Use Topics  . Smoking status: Never Smoker   . Smokeless tobacco: Never Used  . Alcohol Use: No   OB History    Gravida Para Term Preterm AB TAB SAB Ectopic Multiple Living   5 5 5       0 5      Review of Systems  Constitutional: Negative for fever and chills.  Eyes: Negative for visual disturbance.  Gastrointestinal: Negative for nausea and vomiting.  Neurological: Positive for headaches. Negative for dizziness, syncope, weakness, light-headedness and numbness.  All other systems reviewed and are negative.     Allergies  Multivitamins; Banana; Fish oil; and Strawberry extract  Home Medications   Prior to Admission medications   Medication Sig Start Date End Date Taking? Authorizing Provider  diphenhydrAMINE (BENADRYL) 25 mg capsule Take 1 capsule (25 mg total) by mouth every 6 (six) hours as needed. Reported on 10/18/2015 10/18/15  Yes Kermit Baloavid S Tysinger, PA-C   sertraline (ZOLOFT) 50 MG tablet Take 1 tablet (50 mg total) by mouth daily. 07/05/15  Yes Duane LopeJennifer I Rasch, NP  topiramate (TOPAMAX) 25 MG tablet Take 1 tablet (25 mg total) by mouth daily. 10/18/15  Yes David S Tysinger, PA-C    BP 118/71 mmHg  Pulse 69  Temp(Src) 98 F (36.7 C) (Oral)  Resp 18  SpO2 100% Physical Exam  Constitutional: She is oriented to person, place, and time. She appears well-developed and well-nourished. No distress.  HENT:  Head: Normocephalic and atraumatic.  Right Ear: External ear normal.  Left Ear: External ear normal.  Nose: Nose normal.  Mouth/Throat: Uvula is midline, oropharynx is clear and moist and mucous membranes are normal.  Eyes: Conjunctivae, EOM and lids are normal. Pupils are equal, round, and reactive to light. Right eye exhibits no discharge. Left eye exhibits no discharge. No scleral icterus.  Neck: Normal range of motion. Neck supple.  Cardiovascular: Normal rate, regular rhythm, normal heart sounds, intact distal pulses and normal pulses.   Pulmonary/Chest: Effort normal and breath sounds normal. No respiratory distress. She has no wheezes. She has no rales.  Abdominal: Soft. Normal appearance and bowel sounds are normal. She exhibits no distension and no mass. There is no tenderness. There is no rigidity, no rebound and no guarding.  Musculoskeletal: Normal range of motion. She exhibits no edema or tenderness.  Neurological: She is alert and oriented to person, place,  and time. She has normal strength. No cranial nerve deficit or sensory deficit. Coordination normal.  Skin: Skin is warm, dry and intact. No rash noted. She is not diaphoretic. No erythema. No pallor.  Psychiatric: She has a normal mood and affect. Her speech is normal and behavior is normal.  Nursing note and vitals reviewed.   ED Course  Procedures (including critical care time)  Labs Review Labs Reviewed  CBC WITH DIFFERENTIAL/PLATELET  BASIC METABOLIC PANEL  I-STAT  BETA HCG BLOOD, ED ([facility], [facility], [facility] ONLY)    Imaging Review No results found.   I have personally reviewed and evaluated these lab results as part of my medical decision-making.   EKG Interpretation None      MDM   Final diagnoses:  Headache, unspecified headache type    35 year old female presents with headache for the past 5 days. States she has a history of migraines and that her symptoms feel similar. Denies dizziness, lightheadedness, vision changes, photophobia, phonophobia, nausea, vomiting, numbness, weakness, paresthesia.  Patient is afebrile. Vital signs stable. Normal neuro exam with no focal deficit. No nuchal rigidity. Will give benadryl, reglan, fluids.  CBC negative for leukocytosis or anemia. BMP unremarkable. Beta hCG negative.  On reassessment of patient, she reports symptom resolution. She is nontoxic and well-appearing, feel she is stable for discharge at this time. Presentation consistent with patient's typical headache, low suspicion for ICH, SAH, meningitis. Patient to follow up with PCP regarding migraine prophylaxis. Strict return precautions discussed. Patient verbalizes her understanding and is in agreement with plan.  BP 118/71 mmHg  Pulse 69  Temp(Src) 98 F (36.7 C) (Oral)  Resp 18  SpO2 100%     Mady Gemma, PA-C 12/06/15 4098  Arby Barrette, MD 12/06/15 [phone]

## 2015-12-05 NOTE — ED Notes (Signed)
Pt went to go visit a friend upstairs at [address]. Advised patient that her name might be called for more tests, or for a room, she replied "I will only be up there for 2 or 3 mins."

## 2016-04-08 ENCOUNTER — Encounter (HOSPITAL_COMMUNITY): Payer: Self-pay | Admitting: Emergency Medicine

## 2016-04-08 ENCOUNTER — Ambulatory Visit (HOSPITAL_COMMUNITY)
Admission: EM | Admit: 2016-04-08 | Discharge: 2016-04-08 | Disposition: A | Discharge status: Home / Self Care | Payer: Medicaid Other | Attending: Emergency Medicine | Admitting: Emergency Medicine

## 2016-04-08 DIAGNOSIS — Z9109 Other allergy status, other than to drugs and biological substances: Secondary | ICD-10-CM

## 2016-04-08 DIAGNOSIS — Z91048 Other nonmedicinal substance allergy status: Secondary | ICD-10-CM

## 2016-04-08 MED ORDER — CETIRIZINE HCL 10 MG PO TABS: 10.0000 mg | ORAL_TABLET | Freq: Every day | ORAL | 0 refills | Status: DC

## 2016-04-08 MED ORDER — FLUTICASONE PROPIONATE 50 MCG/ACT NA SUSP: 2.0000 | Freq: Every day | NASAL | 0 refills | Status: DC

## 2016-04-08 NOTE — Discharge Instructions (Signed)
Follow a symptoms are coming from allergies. There is no sign of infection. Start taking cetirizine daily. Use the Flonase daily. You should see improvement in the next week. Please come back if you develop fevers or worsening symptoms.  I [facility] about [facility].  I have sent Maggy a message about your financial aid application.

## 2016-04-08 NOTE — ED Provider Notes (Signed)
[facility]    CSN: [id number] Arrival date & time: 04/08/16  1336  First Provider Contact:  First MD Initiated Contact with Patient 04/08/16 1458        History   Chief Complaint Chief Complaint  Patient presents with  . Otalgia    HPI Denise Arias is a 35 y.o. female.   She is a 34 year old woman here for evaluation of allergies. She states for the last 2 weeks she has had nasal congestion, runny nose, itchy eyes, sneezing, postnasal drainage, and ear discomfort. She also reports a cough, but this only occurs at night. No fevers. Her husband states they recently moved to a new house where they used to have dogs.  She also reports pain and discomfort at her Nexplanon site. She states this was placed a little over a year ago. She would like to have it removed and reinserted.    Past Medical History:  Diagnosis Date  . Allergy    RHINITIS  . GERD (gastroesophageal reflux disease)     Patient Active Problem List   Diagnosis Date Noted  . 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 GERD (gastroesophageal reflux disease) 05/27/2013    Past Surgical History:  Procedure Laterality Date  . VAGINAL DELIVERY     X 4    OB History    Gravida Para Term Preterm AB Living   SAB TAB Ectopic Multiple Live Births         0 1       Home Medications    Prior to Admission medications   Medication Sig Start Date End Date Taking? Authorizing Provider  diphenhydrAMINE (BENADRYL) 25 mg capsule Take 1 capsule (25 mg total) by mouth every 6 (six) hours as needed. Reported on 10/18/2015 10/18/15  Yes Kermit Balo Tysinger, PA-C  cetirizine (ZYRTEC) 10 MG tablet Take 1 tablet (10 mg total) by mouth daily. 04/08/16   Charm Rings, MD  fluticasone (FLONASE) 50 MCG/ACT nasal spray Place 2 sprays into both nostrils daily. 04/08/16   Charm Rings, MD  sertraline (ZOLOFT) 50 MG tablet Take 1 tablet (50 mg total) by mouth daily. 07/05/15   Duane Lope, NP  topiramate (TOPAMAX) 25  MG tablet Take 1 tablet (25 mg total) by mouth daily. 10/18/15   Jac Canavan, PA-C    Family History Family History  Problem Relation Age of Onset  . Diabetes Mother     Social History Social History  Substance Use Topics  . Smoking status: Never Smoker  . Smokeless tobacco: Never Used  . Alcohol use No     Allergies   Multivitamins; Banana; Fish oil; and Strawberry extract   Review of Systems Review of Systems  Constitutional: Negative for chills and fever.  HENT: Positive for congestion, ear pain, postnasal drip, rhinorrhea and sore throat.   Eyes: Positive for itching.  Respiratory: Positive for cough. Negative for shortness of breath and wheezing.      Physical Exam Triage Vital Signs ED Triage Vitals  Enc Vitals Group     BP 04/08/16 1437 113/66     Pulse Rate 04/08/16 1437 61     Resp 04/08/16 1437 12     Temp 04/08/16 1437 97.9 F (36.6 C)     Temp Source 04/08/16 1437 Oral     SpO2 04/08/16 1437 99 %     Weight --      Height --  Head Circumference --      Peak Flow --      Pain Score 04/08/16 1448 0     Pain Loc --      Pain Edu? --      Excl. in GC? --    No data found.   Updated Vital Signs BP 113/66 (BP Location: Right Arm)   Pulse 61   Temp 97.9 F (36.6 C) (Oral)   Resp 12   SpO2 99%   Visual Acuity Right Eye Distance:   Left Eye Distance:   Bilateral Distance:    Right Eye Near:   Left Eye Near:    Bilateral Near:     Physical Exam  Constitutional: She is oriented to person, place, and time. She appears well-developed and well-nourished. No distress.  HENT:  Mouth/Throat: Oropharynx is clear and moist. No oropharyngeal exudate.  TMs normal bilaterally. Nasal mucosa slightly erythematous and swollen. Minimal drainage seen.  Eyes: Conjunctivae are normal.  Neck: Neck supple.  Cardiovascular: Normal rate, regular rhythm and normal heart sounds.   No murmur heard. Pulmonary/Chest: Effort normal and breath sounds  normal. She has no wheezes. She has no rales.  Lymphadenopathy:    She has no cervical adenopathy.  Neurological: She is alert and oriented to person, place, and time.     UC Treatments / Results  Labs (all labs ordered are listed, but only abnormal results are displayed) Labs Reviewed - No data to display  EKG  EKG Interpretation None       Radiology No results found.  Procedures Procedures (including critical care time)  Medications Ordered in UC Medications - No data to display   Initial Impression / Assessment and Plan / UC Course  I have reviewed the triage vital signs and the nursing notes.  Pertinent labs & imaging results that were available during my care of the patient were reviewed by me and considered in my medical decision making (see chart for details).  Clinical Course    Suspect symptoms are coming from allergies. We'll treat with cetirizine and Flonase.  [facility].  Final Clinical Impressions(s) / UC Diagnoses   Final diagnoses:  Environmental allergies    New Prescriptions Discharge Medication List as of 04/08/2016  3:10 PM    START taking these medications   Details  cetirizine (ZYRTEC) 10 MG tablet Take 1 tablet (10 mg total) by mouth daily., Starting Mon 04/08/2016, Print    fluticasone (FLONASE) 50 MCG/ACT nasal spray Place 2 sprays into both nostrils daily., Starting Mon 04/08/2016, Print         Charm RingsErin J Sharene Krikorian, MD 04/08/16 [phone]

## 2016-04-08 NOTE — ED Triage Notes (Signed)
The [facility] with a complaint of bilateral ear pain, runny nose, runny eyes and a sore throat x 2 weeks. The patient stated that she has been taking OTC benadryl without good results.

## 2016-04-09 ENCOUNTER — Telehealth: Payer: Self-pay | Admitting: Family Medicine

## 2016-04-09 NOTE — Telephone Encounter (Signed)
ER letter sent 

## 2016-04-26 ENCOUNTER — Encounter (HOSPITAL_COMMUNITY): Payer: Self-pay | Admitting: Emergency Medicine

## 2016-04-26 ENCOUNTER — Ambulatory Visit (HOSPITAL_COMMUNITY)
Admission: EM | Admit: 2016-04-26 | Discharge: 2016-04-26 | Disposition: A | Discharge status: Home / Self Care | Payer: Self-pay | Attending: Family Medicine | Admitting: Family Medicine

## 2016-04-26 DIAGNOSIS — R062 Wheezing: Secondary | ICD-10-CM

## 2016-04-26 DIAGNOSIS — R05 Cough: Secondary | ICD-10-CM

## 2016-04-26 DIAGNOSIS — J40 Bronchitis, not specified as acute or chronic: Secondary | ICD-10-CM

## 2016-04-26 DIAGNOSIS — R059 Cough, unspecified: Secondary | ICD-10-CM

## 2016-04-26 DIAGNOSIS — J9801 Acute bronchospasm: Secondary | ICD-10-CM

## 2016-04-26 MED ORDER — METHYLPREDNISOLONE 4 MG PO TBPK: ORAL_TABLET | ORAL | 0 refills | Status: DC

## 2016-04-26 MED ORDER — BENZONATATE 100 MG PO CAPS: 200.0000 mg | ORAL_CAPSULE | Freq: Three times a day (TID) | ORAL | 0 refills | Status: DC | PRN

## 2016-04-26 MED ORDER — ALBUTEROL SULFATE (2.5 MG/3ML) 0.083% IN NEBU
2.5000 mg | INHALATION_SOLUTION | Freq: Once | RESPIRATORY_TRACT | Status: AC
  Administered 2016-04-26: 2.5 mg via RESPIRATORY_TRACT

## 2016-04-26 MED ORDER — ALBUTEROL SULFATE (2.5 MG/3ML) 0.083% IN NEBU
INHALATION_SOLUTION | RESPIRATORY_TRACT | Status: AC
  Filled 2016-04-26: qty 3

## 2016-04-26 MED ORDER — ALBUTEROL SULFATE HFA 108 (90 BASE) MCG/ACT IN AERS: 2.0000 | INHALATION_SPRAY | RESPIRATORY_TRACT | 0 refills | Status: DC | PRN

## 2016-04-26 MED ORDER — AMOXICILLIN 875 MG PO TABS: 875.0000 mg | ORAL_TABLET | Freq: Two times a day (BID) | ORAL | 0 refills | Status: DC

## 2016-04-26 NOTE — ED Provider Notes (Signed)
CSN: [id number]     Arrival date & time 04/26/16  3086 History   None    Chief Complaint  Patient presents with  . URI  . Wheezing   (Consider location/radiation/quality/duration/timing/severity/associated sxs/prior Treatment) Patient has been having severe cough and wheezing for last couple days.  She states sx's are worse at HS.   The history is provided by the patient.  URI  Presenting symptoms: congestion, cough and fatigue   Severity:  Moderate Onset quality:  Sudden Duration:  2 days Timing:  Constant Progression:  Worsening Chronicity:  Recurrent Relieved by:  Nothing Worsened by:  Nothing Ineffective treatments:  None tried Associated symptoms: wheezing   Wheezing  Associated symptoms: cough and fatigue     Past Medical History:  Diagnosis Date  . Allergy    RHINITIS  . GERD (gastroesophageal reflux disease)    Past Surgical History:  Procedure Laterality Date  . VAGINAL DELIVERY     X 4   Family History  Problem Relation Age of Onset  . Diabetes Mother    Social History  Substance Use Topics  . Smoking status: Never Smoker  . Smokeless tobacco: Never Used  . Alcohol use No   OB History    Gravida Para Term Preterm AB Living   5 5 5     5    SAB TAB Ectopic Multiple Live Births         0 1     Review of Systems  Constitutional: Positive for fatigue.  HENT: Positive for congestion.   Eyes: Negative.   Respiratory: Positive for cough and wheezing.   Gastrointestinal: Negative.   Endocrine: Negative.   Musculoskeletal: Negative.   Skin: Negative.   Allergic/Immunologic: Negative.   Neurological: Negative.   Hematological: Negative.   Psychiatric/Behavioral: Negative.     Allergies  Multivitamins; Banana; Fish oil; and Strawberry extract  Home Medications   Prior to Admission medications   Medication Sig Start Date End Date Taking? Authorizing Provider  fluticasone (FLONASE) 50 MCG/ACT nasal spray Place 2 sprays into both nostrils  daily. 04/08/16  Yes Charm Rings, MD  cetirizine (ZYRTEC) 10 MG tablet Take 1 tablet (10 mg total) by mouth daily. 04/08/16   Charm Rings, MD  diphenhydrAMINE (BENADRYL) 25 mg capsule Take 1 capsule (25 mg total) by mouth every 6 (six) hours as needed. Reported on 10/18/2015 10/18/15   Kermit Balo Tysinger, PA-C  sertraline (ZOLOFT) 50 MG tablet Take 1 tablet (50 mg total) by mouth daily. 07/05/15   Duane Lope, NP  topiramate (TOPAMAX) 25 MG tablet Take 1 tablet (25 mg total) by mouth daily. 10/18/15   Jac Canavan, PA-C   Meds Ordered and Administered this Visit  Medications - No data to display  BP 106/72 (BP Location: Left Arm)   Pulse 65   Temp 98.1 F (36.7 C) (Oral)   Resp 18   SpO2 100%  No data found.   Physical Exam  Constitutional: She appears well-developed and well-nourished.  HENT:  Head: Normocephalic and atraumatic.  Right Ear: External ear normal.  Left Ear: External ear normal.  Nose: Nose normal.  Mouth/Throat: Oropharynx is clear and moist.  Eyes: Conjunctivae are normal. Pupils are equal, round, and reactive to light.  Neck: Normal range of motion. Neck supple.  Cardiovascular: Normal rate, regular rhythm and normal heart sounds.   Pulmonary/Chest: Effort normal. She has wheezes.  Abdominal: Soft. Bowel sounds are normal.  Nursing note and vitals reviewed.  Urgent Care Course   Clinical Course    Procedures (including critical care time)  Labs Review Labs Reviewed - No data to display  Imaging Review No results found.   Visual Acuity Review  Right Eye Distance:   Left Eye Distance:   Bilateral Distance:    Right Eye Near:   Left Eye Near:    Bilateral Near:         MDM  Bronchitis Bronchospasm Wheezing Cough  Neb treatment here with good results.  Albuterol MDI 2 puffs q 4 hours prn #1 Medrol dose pack as directed #21, Tessalon perles  200mg  one po tid prn #21 Amoxicillin 875mg  one po bid x 10 days #20  Push po fluids, rest,  tylenol and motrin otc prn as directed for fever, arthralgias, and myalgias.  Follow up prn if sx's continue or persist.    Deatra CanterWilliam J Lyla Jasek, FNP 04/26/16 1056

## 2016-04-26 NOTE — ED Triage Notes (Signed)
Pt c/o cold sx onset x1 month  Sx include: prod cough, SOB, wheezing  Seen here on 8/7 for similar sx  Denies fevers, chills... A&O x4.... NAD

## 2016-06-04 ENCOUNTER — Encounter (HOSPITAL_COMMUNITY): Payer: Self-pay | Admitting: Emergency Medicine

## 2016-06-04 ENCOUNTER — Emergency Department (HOSPITAL_COMMUNITY)
Admission: EM | Admit: 2016-06-04 | Discharge: 2016-06-04 | Disposition: A | Discharge status: Home / Self Care | Payer: Self-pay | Attending: Emergency Medicine | Admitting: Emergency Medicine

## 2016-06-04 ENCOUNTER — Emergency Department (HOSPITAL_COMMUNITY): Payer: Self-pay

## 2016-06-04 ENCOUNTER — Encounter (HOSPITAL_COMMUNITY): Payer: Self-pay

## 2016-06-04 DIAGNOSIS — R202 Paresthesia of skin: Secondary | ICD-10-CM | POA: Insufficient documentation

## 2016-06-04 DIAGNOSIS — Z79899 Other long term (current) drug therapy: Secondary | ICD-10-CM | POA: Insufficient documentation

## 2016-06-04 DIAGNOSIS — R42 Dizziness and giddiness: Secondary | ICD-10-CM | POA: Insufficient documentation

## 2016-06-04 DIAGNOSIS — F411 Generalized anxiety disorder: Secondary | ICD-10-CM

## 2016-06-04 DIAGNOSIS — R791 Abnormal coagulation profile: Secondary | ICD-10-CM | POA: Insufficient documentation

## 2016-06-04 DIAGNOSIS — R002 Palpitations: Secondary | ICD-10-CM | POA: Insufficient documentation

## 2016-06-04 DIAGNOSIS — Z5321 Procedure and treatment not carried out due to patient leaving prior to being seen by health care provider: Secondary | ICD-10-CM | POA: Insufficient documentation

## 2016-06-04 LAB — URINALYSIS, ROUTINE W REFLEX MICROSCOPIC
BILIRUBIN URINE: NEGATIVE
Bilirubin Urine: NEGATIVE
GLUCOSE, UA: NEGATIVE mg/dL
GLUCOSE, UA: NEGATIVE mg/dL
HGB URINE DIPSTICK: NEGATIVE
Hgb urine dipstick: NEGATIVE
KETONES UR: NEGATIVE mg/dL
Ketones, ur: NEGATIVE mg/dL
LEUKOCYTES UA: NEGATIVE
NITRITE: NEGATIVE
NITRITE: NEGATIVE
PH: 6.5 (ref 5.0–8.0)
PROTEIN: NEGATIVE mg/dL
Protein, ur: NEGATIVE mg/dL
Specific Gravity, Urine: 1.004 — ABNORMAL LOW (ref 1.005–1.030)
Specific Gravity, Urine: 1.034 — ABNORMAL HIGH (ref 1.005–1.030)
pH: 6.5 (ref 5.0–8.0)

## 2016-06-04 LAB — PROTIME-INR
INR: 1.09
PROTHROMBIN TIME: 14.2 s (ref 11.4–15.2)

## 2016-06-04 LAB — BASIC METABOLIC PANEL
Anion gap: 10 (ref 5–15)
BUN: 16 mg/dL (ref 6–20)
CO2: 23 mmol/L (ref 22–32)
Calcium: 9.2 mg/dL (ref 8.9–10.3)
Chloride: 103 mmol/L (ref 101–111)
Creatinine, Ser: 0.69 mg/dL (ref 0.44–1.00)
GFR calc Af Amer: >60 mL/min (ref >60)
GLUCOSE: 133 mg/dL — AB (ref 65–99)
POTASSIUM: 3.3 mmol/L — AB (ref 3.5–5.1)
Sodium: 136 mmol/L (ref 135–145)

## 2016-06-04 LAB — CBC
HEMATOCRIT: 39.9 % (ref 36.0–46.0)
HEMATOCRIT: 41.4 % (ref 36.0–46.0)
Hemoglobin: 13.1 g/dL (ref 12.0–15.0)
Hemoglobin: 13.5 g/dL (ref 12.0–15.0)
MCH: 29.2 pg (ref 26.0–34.0)
MCH: 29.2 pg (ref 26.0–34.0)
MCHC: 32.8 g/dL (ref 30.0–36.0)
MCHC: 32.6 g/dL (ref 30.0–36.0)
MCV: 89.1 fL (ref 78.0–100.0)
MCV: 89.4 fL (ref 78.0–100.0)
PLATELETS: 248 10*3/uL (ref 150–400)
Platelets: 249 10*3/uL (ref 150–400)
RBC: 4.48 MIL/uL (ref 3.87–5.11)
RBC: 4.63 MIL/uL (ref 3.87–5.11)
RDW: 12.6 % (ref 11.5–15.5)
RDW: 12.7 % (ref 11.5–15.5)
WBC: 7.9 10*3/uL (ref 4.0–10.5)
WBC: 7.6 10*3/uL (ref 4.0–10.5)

## 2016-06-04 LAB — I-STAT TROPONIN, ED: Troponin i, poc: 0 ng/mL (ref 0.00–0.08)

## 2016-06-04 LAB — DIFFERENTIAL
BASOS ABS: 0 10*3/uL (ref 0.0–0.1)
BASOS PCT: 0 %
EOS ABS: 0.3 10*3/uL (ref 0.0–0.7)
Eosinophils Relative: 4 %
Lymphocytes Relative: 24 %
Lymphs Abs: 1.8 10*3/uL (ref 0.7–4.0)
MONOS PCT: 6 %
Monocytes Absolute: 0.4 10*3/uL (ref 0.1–1.0)
NEUTROS PCT: 66 %
Neutro Abs: 5 10*3/uL (ref 1.7–7.7)

## 2016-06-04 LAB — T4, FREE: FREE T4: 0.88 ng/dL (ref 0.61–1.12)

## 2016-06-04 LAB — I-STAT CHEM 8, ED
BUN: 15 mg/dL (ref 6–20)
CALCIUM ION: 1.16 mmol/L (ref 1.15–1.40)
CHLORIDE: 104 mmol/L (ref 101–111)
Creatinine, Ser: 0.6 mg/dL (ref 0.44–1.00)
GLUCOSE: 142 mg/dL — AB (ref 65–99)
HCT: 42 % (ref 36.0–46.0)
Hemoglobin: 14.3 g/dL (ref 12.0–15.0)
POTASSIUM: 3.5 mmol/L (ref 3.5–5.1)
SODIUM: 142 mmol/L (ref 135–145)
TCO2: 25 mmol/L (ref 0–100)

## 2016-06-04 LAB — APTT: APTT: 27 s (ref 24–36)

## 2016-06-04 LAB — COMPREHENSIVE METABOLIC PANEL
ALT: 21 U/L (ref 14–54)
AST: 21 U/L (ref 15–41)
Albumin: 3.8 g/dL (ref 3.5–5.0)
Alkaline Phosphatase: 51 U/L (ref 38–126)
Anion gap: 5 (ref 5–15)
BUN: 13 mg/dL (ref 6–20)
CHLORIDE: 111 mmol/L (ref 101–111)
CO2: 25 mmol/L (ref 22–32)
Calcium: 9.1 mg/dL (ref 8.9–10.3)
Creatinine, Ser: 0.63 mg/dL (ref 0.44–1.00)
GFR calc Af Amer: >60 mL/min (ref >60)
Glucose, Bld: 146 mg/dL — ABNORMAL HIGH (ref 65–99)
POTASSIUM: 3.5 mmol/L (ref 3.5–5.1)
SODIUM: 141 mmol/L (ref 135–145)
Total Bilirubin: 0.6 mg/dL (ref 0.3–1.2)
Total Protein: 7.2 g/dL (ref 6.5–8.1)

## 2016-06-04 LAB — I-STAT BETA HCG BLOOD, ED (MC, WL, AP ONLY): I-stat hCG, quantitative: <5 m[IU]/mL (ref <5)

## 2016-06-04 LAB — URINE MICROSCOPIC-ADD ON

## 2016-06-04 LAB — TSH: TSH: 0.595 u[IU]/mL (ref 0.350–4.500)

## 2016-06-04 NOTE — ED Triage Notes (Signed)
Pt states that for the past several days she has been getting dizzy like she is going to pass out. Pt also c/o of nausea and no vomiting. States generalized weakness over the past several days.

## 2016-06-04 NOTE — ED Triage Notes (Signed)
Pt states she has been feeling numb/tingling all over for two weeks. Pt also complaining of n/v. Pt was here this morning for the same thing but left without being seen due to long wait time.

## 2016-06-04 NOTE — ED Provider Notes (Signed)
[facility]-EMERGENCY DEPT Provider Note   CSN: [id number] Arrival date & time: 06/04/16  1523  History   Chief Complaint Chief Complaint  Patient presents with  . Tingling   HPI  The history is provided by the patient and a relative. The history is limited by a language barrier. A language interpreter was used.   Denise Arias is an 35 y.o. female with history of allergic rhinitis and GERD who presents to the ED for evaluation of multiple complaints. She states that over the past seven months she has had episodes about once a month where she will feel her body go numb and tingly from her head to her toes. She states during these episodes it sometimes feels like the room is spinning around her or she might pass out. She states she feels very afraid during these episodes despite knowing she has nothing to be afraid of. The most recent episode was earlier today. States sometimes her heart will feel like it is racing during these episodes. Typically the symptoms resolve within minutes. She states the first time she felt these symptoms was when she was in the ED in April for evaluation of a headache; she states she got injections (Reglan and benadryl) and immediately felt the aforementioned symptoms. Since then, however, the episodes are not related to any medication use. Pt states she has had a cough for the past three months that her PCP has diagnosed as allergies and she takes benadryl and albuterol inhaler as needed. Denies chest pain. Denies SOB. Denies LOC. Denies recent travel or cigarette use. Denies leg pain or swelling. Denies history of anxiety or recent increased stress. Denies abdominal pain, n/v/d, fever, chills, polyuria, polydipsia.   Past Medical History:  Diagnosis Date  . Allergy    RHINITIS  . GERD (gastroesophageal reflux disease)     Patient Active Problem List   Diagnosis Date Noted  . 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 GERD (gastroesophageal reflux disease) 05/27/2013     Past Surgical History:  Procedure Laterality Date  . VAGINAL DELIVERY     X 4    OB History    Gravida Para Term Preterm AB Living   5 5 5     5    SAB TAB Ectopic Multiple Live Births         0 1       Home Medications    Prior to Admission medications   Medication Sig Start Date End Date Taking? Authorizing Provider  albuterol (PROVENTIL HFA;VENTOLIN HFA) 108 (90 Base) MCG/ACT inhaler Inhale 2 puffs into the lungs every 4 (four) hours as needed for wheezing or shortness of breath. 04/26/16   Deatra Canter, FNP  amoxicillin (AMOXIL) 875 MG tablet Take 1 tablet (875 mg total) by mouth 2 (two) times daily. 04/26/16   Deatra Canter, FNP  benzonatate (TESSALON) 100 MG capsule Take 2 capsules (200 mg total) by mouth 3 (three) times daily as needed for cough. 04/26/16   Deatra Canter, FNP  cetirizine (ZYRTEC) 10 MG tablet Take 1 tablet (10 mg total) by mouth daily. 04/08/16   Charm Rings, MD  diphenhydrAMINE (BENADRYL) 25 mg capsule Take 1 capsule (25 mg total) by mouth every 6 (six) hours as needed. Reported on 10/18/2015 10/18/15   Kermit Balo Tysinger, PA-C  fluticasone (FLONASE) 50 MCG/ACT nasal spray Place 2 sprays into both nostrils daily. 04/08/16   Charm Rings, MD  methylPREDNISolone (MEDROL DOSEPAK) 4 MG TBPK tablet Take 6-5-4-3-2-1 po qd 04/26/16  Deatra Canter, FNP  sertraline (ZOLOFT) 50 MG tablet Take 1 tablet (50 mg total) by mouth daily. 07/05/15   Duane Lope, NP  topiramate (TOPAMAX) 25 MG tablet Take 1 tablet (25 mg total) by mouth daily. 10/18/15   Jac Canavan, PA-C    Family History Family History  Problem Relation Age of Onset  . Diabetes Mother     Social History Social History  Substance Use Topics  . Smoking status: Never Smoker  . Smokeless tobacco: Never Used  . Alcohol use No     Allergies   Multivitamins; Banana; Fish oil; and Strawberry extract   Review of Systems Review of Systems 10 Systems reviewed and are negative for acute  change except as noted in the HPI.  Physical Exam Updated Vital Signs BP 141/87 (BP Location: Right Arm)   Pulse 88   Temp 98.3 F (36.8 C) (Oral)   Resp 17   Ht 5\' 4"  (1.626 m)   Wt 88 kg   LMP 05/21/2016   SpO2 98%   BMI 33.30 kg/m   Physical Exam  Constitutional: She is oriented to person, place, and time.  HENT:  Right Ear: External ear normal.  Left Ear: External ear normal.  Nose: Nose normal.  Mouth/Throat: Oropharynx is clear and moist. No oropharyngeal exudate.  Eyes: Conjunctivae and EOM are normal. Pupils are equal, round, and reactive to light.  No nystagmus  Neck: Normal range of motion. Neck supple.  No meningismus  Cardiovascular: Normal rate, regular rhythm, normal heart sounds and intact distal pulses.   Pulmonary/Chest: Effort normal and breath sounds normal. No respiratory distress. She has no wheezes.  Abdominal: Soft. Bowel sounds are normal. She exhibits no distension. There is no tenderness. There is no rebound and no guarding.  Musculoskeletal: She exhibits no edema.  No calf tenderness or LE edema  Lymphadenopathy:    She has no cervical adenopathy.  Neurological: She is alert and oriented to person, place, and time. She has normal reflexes. No cranial nerve deficit.  5/5 strength throughout Sensation intact throughout Normal finger to nose No pronator drift Steady gait  Skin: Skin is warm and dry.  Psychiatric: She has a normal mood and affect.  Nursing note and vitals reviewed.    ED Treatments / Results  Labs (all labs ordered are listed, but only abnormal results are displayed) Labs Reviewed  COMPREHENSIVE METABOLIC PANEL - Abnormal; Notable for the following:       Result Value   Glucose, Bld 146 (*)    All other components within normal limits  I-STAT CHEM 8, ED - Abnormal; Notable for the following:    Glucose, Bld 142 (*)    All other components within normal limits  PROTIME-INR  APTT  CBC  DIFFERENTIAL  TSH  T4, FREE    URINALYSIS, ROUTINE W REFLEX MICROSCOPIC (NOT AT [facility])  I-STAT TROPOININ, ED  CBG MONITORING, ED  POC URINE PREG, ED    EKG  EKG Interpretation None       Radiology Dg Chest 2 View  Result Date: 06/04/2016 CLINICAL DATA:  Numbness, nausea and vomiting.  Cough for 3 months. EXAM: CHEST  2 VIEW COMPARISON:  Chest CT 08/23/2014 FINDINGS: Cardiomediastinal contours are normal. No pneumothorax or sizable pleural effusion. No focal airspace consolidation or pulmonary edema. IMPRESSION: Clear lungs. Electronically Signed   By: Deatra Robinson M.D.   On: 06/04/2016 18:27   Ct Head Wo Contrast  Result Date: 06/04/2016 CLINICAL DATA:  Right posterior head pain for 2 days, history of headaches EXAM: CT HEAD WITHOUT CONTRAST TECHNIQUE: Contiguous axial images were obtained from the base of the skull through the vertex without intravenous contrast. COMPARISON:  01/16/2011 FINDINGS: Brain: No evidence of acute infarction, hemorrhage, hydrocephalus, extra-axial collection or mass lesion/mass effect. Vascular: No hyperdense vessel or unexpected calcification. Skull: No osseous abnormality. Sinuses/Orbits: Visualized paranasal sinuses are clear. Visualized mastoid sinuses are clear. Visualized orbits demonstrate no focal abnormality. Other: None IMPRESSION: No acute intracranial pathology. Electronically Signed   By: Elige KoHetal  Patel   On: 06/04/2016 16:46    Procedures Procedures (including critical care time)  Medications Ordered in ED Medications - No data to display   Initial Impression / Assessment and Plan / ED Course  I have reviewed the triage vital signs and the nursing notes.  Pertinent labs & imaging results that were available during my care of the patient were reviewed by me and considered in my medical decision making (see chart for details).  Clinical Course   Pt is an 35 y.o. female presenting to the ED for evaluation of these monthly episodes where she will feel numb/tingling all  over, dizzy, palpitations. The first time this happened was when she was in the ED for a headache earlier this year and felt these symptoms while receiving reglan and benadryl. She states she takes benadryl intermittently now for allergies but does not feel her episodes correlate with medication taking. She describes feeling "very afraid" during these episodes and I question whether stress/anxiety is playing a role in what could be anxiety attacks. Her exam today is very reassuring. Her neurologic exam is completely intact. Her CT head is negative and I see no indication for MRI or further head imaging. She has been having a dry cough for the past three months which has been diagnosed as allergies; her lungs CTAB CXR negative EKG in NSR and PERC 0. Doubt PE. HEART score 0. Doubt ACS. I did add thyroid studies which are pending. Pt feels ready to leave. Her UA/urine preg is pending as well but pt denies urinary symptoms. She is hemodynamically stable and in NAD. Will d/c home with instructions for close PCP f/u. Strict ER return precautions given.  Final Clinical Impressions(s) / ED Diagnoses   Final diagnoses:  Tingling  Palpitations  Anxiety state    New Prescriptions New Prescriptions   No medications on file     Derl BarrowSerena Y Arwin Bisceglia, PA-C 06/04/16 1935    Bethann BerkshireJoseph Zammit, MD 06/04/16 2314

## 2016-06-04 NOTE — ED Notes (Signed)
Pt ambulated to restroom in [facility].

## 2016-06-04 NOTE — ED Triage Notes (Signed)
Pt also denies any pain- states she has had blurry vision off and on and feels weak.

## 2016-06-04 NOTE — Discharge Instructions (Signed)
You were seen in the emergency room for evaluation of multiple complaints. As we discussed, your exam and workup was reassuring. I did check some thyroid bloodwork that will be in the system for your primary care provider to follow up on. I suspect your symptoms might be related to stress or anxiety. It could also be a side effect of the medication you are taking for allergies, though you said you haven't been taking that medicine recently. Either way, please follow up with your primary care provider as soon as possible. Return to the emergency room for new or worsening symptoms.

## 2016-06-05 LAB — POC URINE PREG, ED: Preg Test, Ur: NEGATIVE

## 2016-07-15 ENCOUNTER — Ambulatory Visit: Payer: Self-pay | Admitting: Obstetrics & Gynecology

## 2016-11-15 ENCOUNTER — Encounter: Payer: Self-pay | Admitting: Medical

## 2016-11-15 ENCOUNTER — Ambulatory Visit (INDEPENDENT_AMBULATORY_CARE_PROVIDER_SITE_OTHER): Payer: BLUE CROSS/BLUE SHIELD | Admitting: Medical

## 2016-11-15 VITALS — BP 124/82 | HR 67 | Wt 187.0 lb

## 2016-11-15 DIAGNOSIS — R519 Headache, unspecified: Secondary | ICD-10-CM | POA: Insufficient documentation

## 2016-11-15 DIAGNOSIS — R5383 Other fatigue: Secondary | ICD-10-CM | POA: Diagnosis not present

## 2016-11-15 DIAGNOSIS — G479 Sleep disorder, unspecified: Secondary | ICD-10-CM | POA: Insufficient documentation

## 2016-11-15 DIAGNOSIS — M542 Cervicalgia: Secondary | ICD-10-CM | POA: Insufficient documentation

## 2016-11-15 DIAGNOSIS — Z8349 Family history of other endocrine, nutritional and metabolic diseases: Secondary | ICD-10-CM | POA: Diagnosis not present

## 2016-11-15 DIAGNOSIS — R002 Palpitations: Secondary | ICD-10-CM | POA: Insufficient documentation

## 2016-11-15 DIAGNOSIS — R51 Headache: Secondary | ICD-10-CM

## 2016-11-15 DIAGNOSIS — R6889 Other general symptoms and signs: Secondary | ICD-10-CM | POA: Insufficient documentation

## 2016-11-15 LAB — CBC
HCT: 40.3 % (ref 35.0–45.0)
HEMOGLOBIN: 13.1 g/dL (ref 11.7–15.5)
MCH: 29.1 pg (ref 27.0–33.0)
MCHC: 32.5 g/dL (ref 32.0–36.0)
MCV: 89.6 fL (ref 80.0–100.0)
MPV: 11.1 fL (ref 7.5–12.5)
Platelets: 245 10*3/uL (ref 140–400)
RBC: 4.5 MIL/uL (ref 3.80–5.10)
RDW: 13.4 % (ref 11.0–15.0)
WBC: 7.7 10*3/uL (ref 4.0–10.5)

## 2016-11-15 LAB — BASIC METABOLIC PANEL
BUN: 14 mg/dL (ref 7–25)
CHLORIDE: 104 mmol/L (ref 98–110)
CO2: 23 mmol/L (ref 20–31)
Calcium: 8.9 mg/dL (ref 8.6–10.2)
Creat: 0.46 mg/dL — ABNORMAL LOW (ref 0.50–1.10)
GLUCOSE: 92 mg/dL (ref 65–99)
POTASSIUM: 4 mmol/L (ref 3.5–5.3)
SODIUM: 138 mmol/L (ref 135–146)

## 2016-11-15 NOTE — Progress Notes (Signed)
Subjective: Chief Complaint  Patient presents with  . neck pain , thyroid    neck pain in back of neck and possible thyroia issues, change in mood , change in weight, change in hair   Here for several concerns.   Has 3 week hx/o neck pain.  Has several symptoms, worried about her thyroid.   Feels like heart is beating fast, feels anxious although not scared.  Feels hot all the tire, tired all the time.   Have been having headaches all the time.  Her cousin is a doctor and recommended she come in to have her thyroid checked.    She had some trouble breathing few months ago, [facility], was given inhaler, albuterol.  Has used this in a few months.  Eating 2 times per day.  Doesn't feel hungry all the time.  Denies any problems with urination or bowels, normal voiding.   Periods sometimes a little irregularly, monthly but can be 21 days, sometimes q28 days.  Has nexplanon.  No abdominal pain, no back pain, no arm or leg pain.     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  She does the housework as well.    No other aggravating or relieving factors. No other complaint.   Past Medical History:  Diagnosis Date  . Allergy    RHINITIS  . GERD (gastroesophageal reflux disease)    Current Outpatient Prescriptions on File Prior to Visit  Medication Sig Dispense Refill  . acetaminophen (TYLENOL) 650 MG CR tablet Take 650 mg by mouth every 8 (eight) hours as needed for pain.    Marland Kitchen. albuterol (PROVENTIL HFA;VENTOLIN HFA) 108 (90 Base) MCG/ACT inhaler Inhale 2 puffs into the lungs every 4 (four) hours as needed for wheezing or shortness of breath. 1 Inhaler 0  . fluticasone (FLONASE) 50 MCG/ACT nasal spray Place 2 sprays into both nostrils daily. (Patient taking differently: Place 1-2 sprays into both nostrils See admin instructions. One to two times a day as needed for allergic symptoms) 16 g 0   No current  facility-administered medications on file prior to visit.    ROS as in subjective   Objective: BP 124/82   Pulse 67   Wt 187 lb (84.8 kg)   SpO2 98%   BMI 32.10 kg/m   Wt Readings from Last 3 Encounters:  11/15/16 187 lb (84.8 kg)  06/04/16 194 lb (88 kg)  10/18/15 189 lb (85.7 kg)    General appearance: alert, no distress, WD/WN, AA female HEENT: normocephalic, sclerae anicteric, PERRLA, EOMi, nares patent, no discharge or erythema, pharynx normal Oral cavity: MMM, no lesions Neck: supple, no lymphadenopathy, no thyromegaly, no masses, normal ROM, but mildly tender posterolateral bilat neck Heart: RRR, normal S1, S2, no murmurs Lungs: CTA bilaterally, no wheezes, rhonchi, or rales Abdomen: +bs, soft, non tender, non distended, no masses, no hepatomegaly, no splenomegaly Back: non tender Musculoskeletal: nontender, no swelling, no obvious deformity Extremities: no edema, no cyanosis, no clubbing Pulses: 2+ symmetric, upper and lower extremities, normal cap refill Neurological: alert, oriented x 3, CN2-12 intact, strength normal upper extremities and lower extremities, sensation normal throughout, DTRs 2+ throughout, no cerebellar signs, gait normal Psychiatric: normal affect, behavior normal, pleasant     Assessment: Encounter Diagnoses  Name Primary?  . Frequent headaches Yes  . Palpitation   . Sore neck   . Family history of thyroid disease in sister   .  Sleep disturbance   . Fatigue, unspecified type   . Heat intolerance     Plan: discussed possible causes of symptoms including thyroid disease, stress, possibly sleep apnea.  discussed stress reduction, hydration, exercise.  Labs today.  Consider sleep study.  Emile was seen today for neck pain , thyroid.  Diagnoses and all orders for this visit:  Frequent headaches -     CBC -     Basic metabolic panel -     TSH -     T4, free -     Thyroid antibodies  Palpitation -     CBC -     Basic metabolic  panel -     TSH -     T4, free -     Thyroid antibodies  Sore neck -     CBC -     Basic metabolic panel -     TSH -     T4, free -     Thyroid antibodies  Family history of thyroid disease in sister -     CBC -     Basic metabolic panel -     TSH -     T4, free -     Thyroid antibodies  Sleep disturbance -     CBC -     Basic metabolic panel -     TSH -     T4, free -     Thyroid antibodies  Fatigue, unspecified type -     CBC -     Basic metabolic panel -     TSH -     T4, free -     Thyroid antibodies  Heat intolerance -     CBC -     Basic metabolic panel -     TSH -     T4, free -     Thyroid antibodies  f/u pending labs

## 2016-11-16 LAB — TSH: TSH: 1.09 m[IU]/L

## 2016-11-16 LAB — T4, FREE: FREE T4: 1.1 ng/dL (ref 0.8–1.8)

## 2016-11-18 ENCOUNTER — Telehealth: Payer: Self-pay

## 2016-11-18 LAB — THYROID ANTIBODIES: Thyroperoxidase Ab SerPl-aCnc: 1 IU/mL (ref <9)

## 2016-11-18 NOTE — Telephone Encounter (Signed)
Did mean to close  The result notes, spoke with pt husband about results and he said that he is going to ask his wife about the sleep study. Then he will call us back.

## 2017-03-03 ENCOUNTER — Ambulatory Visit (INDEPENDENT_AMBULATORY_CARE_PROVIDER_SITE_OTHER): Payer: BLUE CROSS/BLUE SHIELD | Admitting: Medical

## 2017-03-03 ENCOUNTER — Encounter: Payer: Self-pay | Admitting: Medical

## 2017-03-03 VITALS — BP 126/74 | HR 60 | Wt 188.2 lb

## 2017-03-03 DIAGNOSIS — M542 Cervicalgia: Secondary | ICD-10-CM | POA: Diagnosis not present

## 2017-03-03 DIAGNOSIS — T50905D Adverse effect of unspecified drugs, medicaments and biological substances, subsequent encounter: Secondary | ICD-10-CM

## 2017-03-03 DIAGNOSIS — T887XXD Unspecified adverse effect of drug or medicament, subsequent encounter: Secondary | ICD-10-CM | POA: Diagnosis not present

## 2017-03-03 DIAGNOSIS — R238 Other skin changes: Secondary | ICD-10-CM | POA: Insufficient documentation

## 2017-03-03 DIAGNOSIS — R002 Palpitations: Secondary | ICD-10-CM

## 2017-03-03 DIAGNOSIS — T7840XD Allergy, unspecified, subsequent encounter: Secondary | ICD-10-CM

## 2017-03-03 DIAGNOSIS — T50905A Adverse effect of unspecified drugs, medicaments and biological substances, initial encounter: Secondary | ICD-10-CM | POA: Insufficient documentation

## 2017-03-03 DIAGNOSIS — T7840XA Allergy, unspecified, initial encounter: Secondary | ICD-10-CM | POA: Insufficient documentation

## 2017-03-03 LAB — CK: CK TOTAL: 86 U/L (ref 29–143)

## 2017-03-03 NOTE — Progress Notes (Signed)
Subjective: Chief Complaint  Patient presents with  . heart racing    heart racing  from medicines, neck pain , heaaches   Here with husband.   She has several concerns.  She feels she has "chest allergies" or adverse reactions to any oral medications.  This seemed to start initially with child birth a few years ago, but seems to have worsened the more she has taken any oral medication.  She notes 12/2015 going to the emergency dept for headaches, had an injection of some medication (possibly Reglan).  Ever since then she feels like she can't take any oral medication due to insensitivity or severe reaction. She notes that even tylenol or ibuprofen within 10 minutes of taking this gets palpitations of the heart, feels short of breath, and some medications make her feel lethargic and out of it.   She notes whatever medication was given to her 12/2015 IV made her almost go into a coma.   She wants to find out what she can or can't take, what her allergies are.  She is also worried that if she ever has to be in the hospital, she worries about possible severe allergic reaction.    No family hx/o autoimmune or rheumatoid condition.  Last visit she c/o frequent headache, but she says that lately headaches are mild and intermittent.  She notes neck pains somewhat regularly.  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  She denies injury or trauma.   No recent massage.  No numbness, tingling, weakness, vision or hearing changes.     She has no other c/o.  Past Medical History:  Diagnosis Date  . Allergy    RHINITIS  . GERD (gastroesophageal reflux disease)    Current Outpatient Prescriptions on File Prior to Visit  Medication Sig Dispense Refill  . acetaminophen (TYLENOL) 650 MG CR tablet Take 650 mg by mouth every 8 (eight) hours as needed for pain.    . fluticasone (FLONASE) 50 MCG/ACT nasal spray Place 2 sprays into both nostrils daily. (Patient taking differently: Place 1-2 sprays into both  nostrils See admin instructions. One to two times a day as needed for allergic symptoms) 16 g 0   No current facility-administered medications on file prior to visit.    ROS as in subjective   Objective: BP 126/74   Pulse 60   Wt 188 lb 3.2 oz (85.4 kg)   SpO2 98%   BMI 32.30 kg/m   General appearance: alert, no distress, WD/WN HEENT: normocephalic, sclerae anicteric, TMs pearly, nares patent, no discharge or erythema, pharynx normal Oral cavity: MMM, no lesions Neck: supple, no lymphadenopathy, no thyromegaly, no masses Heart: RRR, normal S1, S2, no murmurs Lungs: CTA bilaterally, no wheezes, rhonchi, or rales Ext: no edema Pulses: 2+ symmetric, upper and lower extremities, normal cap refill Neuro: CN2-12 intact, normal strength, sensation, DTRs, non focal exam    Assessment: Encounter Diagnoses  Name Primary?  . Palpitations Yes  . Allergic reaction, subsequent encounter   . Skin sensitivity   . Medication reaction, subsequent encounter   . Neck pain     Plan: Palpitations, possible adverse medication reactions.  Referral to allergist.  In the meantime, screening labs as below in the event of autoimmune cause.  Reviewed her 10/2016 baseline labs for CBC, thyroid BMET, that were normal  Neck pain - advised better ergonomic positioning at work, recommended regular massage therapy,  Advised daily stretching routine as discussed and demonstrated.   F/u prn  Anndee was seen today for heart racing.  Diagnoses and all orders for this visit:  Palpitations -     Cyclic citrul peptide antibody, IgG -     Sedimentation rate -     CK -     Rheumatoid factor -     ANA -     Ambulatory referral to Allergy  Allergic reaction, subsequent encounter -     Cyclic citrul peptide antibody, IgG -     Sedimentation rate -     CK -     Rheumatoid factor -     ANA -     Ambulatory referral to Allergy  Skin sensitivity -     Cyclic citrul peptide antibody, IgG -      Sedimentation rate -     CK -     Rheumatoid factor -     ANA -     Ambulatory referral to Allergy  Medication reaction, subsequent encounter -     Cyclic citrul peptide antibody, IgG -     Sedimentation rate -     CK -     Rheumatoid factor -     ANA -     Ambulatory referral to Allergy  Neck pain

## 2017-03-04 LAB — SEDIMENTATION RATE: Sed Rate: 15 mm/hr (ref 0–20)

## 2017-03-04 LAB — ANA: Anti Nuclear Antibody(ANA): NEGATIVE

## 2017-03-04 LAB — CYCLIC CITRUL PEPTIDE ANTIBODY, IGG

## 2017-03-04 LAB — RHEUMATOID FACTOR

## 2017-03-21 ENCOUNTER — Ambulatory Visit: Payer: BLUE CROSS/BLUE SHIELD | Admitting: Family Medicine

## 2017-03-28 ENCOUNTER — Encounter: Payer: Self-pay | Admitting: Family Medicine

## 2017-04-25 ENCOUNTER — Emergency Department (HOSPITAL_COMMUNITY)
Admission: EM | Admit: 2017-04-25 | Discharge: 2017-04-25 | Discharge status: Against Medical Advice | Payer: BLUE CROSS/BLUE SHIELD | Attending: Emergency Medicine | Admitting: Emergency Medicine

## 2017-04-25 ENCOUNTER — Encounter (HOSPITAL_COMMUNITY): Payer: Self-pay | Admitting: Emergency Medicine

## 2017-04-25 DIAGNOSIS — R002 Palpitations: Secondary | ICD-10-CM | POA: Insufficient documentation

## 2017-04-25 DIAGNOSIS — Z5321 Procedure and treatment not carried out due to patient leaving prior to being seen by health care provider: Secondary | ICD-10-CM | POA: Insufficient documentation

## 2017-04-25 LAB — CBC WITH DIFFERENTIAL/PLATELET
BASOS ABS: 0 10*3/uL (ref 0.0–0.1)
BASOS PCT: 0 %
Eosinophils Absolute: 0.5 10*3/uL (ref 0.0–0.7)
Eosinophils Relative: 8 %
HEMATOCRIT: 38.3 % (ref 36.0–46.0)
HEMOGLOBIN: 12.6 g/dL (ref 12.0–15.0)
LYMPHS PCT: 23 %
Lymphs Abs: 1.6 10*3/uL (ref 0.7–4.0)
MCH: 28.8 pg (ref 26.0–34.0)
MCHC: 32.9 g/dL (ref 30.0–36.0)
MCV: 87.4 fL (ref 78.0–100.0)
MONO ABS: 0.8 10*3/uL (ref 0.1–1.0)
MONOS PCT: 11 %
NEUTROS ABS: 4.1 10*3/uL (ref 1.7–7.7)
NEUTROS PCT: 58 %
Platelets: 219 10*3/uL (ref 150–400)
RBC: 4.38 MIL/uL (ref 3.87–5.11)
RDW: 12.4 % (ref 11.5–15.5)
WBC: 7.1 10*3/uL (ref 4.0–10.5)

## 2017-04-25 LAB — I-STAT TROPONIN, ED: TROPONIN I, POC: 0 ng/mL (ref 0.00–0.08)

## 2017-04-25 LAB — BASIC METABOLIC PANEL
ANION GAP: 9 (ref 5–15)
BUN: 18 mg/dL (ref 6–20)
CHLORIDE: 106 mmol/L (ref 101–111)
CO2: 24 mmol/L (ref 22–32)
Calcium: 9.1 mg/dL (ref 8.9–10.3)
Creatinine, Ser: 0.51 mg/dL (ref 0.44–1.00)
GFR calc non Af Amer: >60 mL/min (ref >60)
GLUCOSE: 111 mg/dL — AB (ref 65–99)
Potassium: 3.6 mmol/L (ref 3.5–5.1)
Sodium: 139 mmol/L (ref 135–145)

## 2017-04-25 LAB — I-STAT BETA HCG BLOOD, ED (MC, WL, AP ONLY): I-stat hCG, quantitative: <5 m[IU]/mL (ref <5)

## 2017-04-25 NOTE — ED Notes (Signed)
No answer when called for reassessment of vitals.  

## 2017-04-25 NOTE — ED Notes (Signed)
Pt called in the waiting room no answer

## 2017-04-25 NOTE — ED Notes (Signed)
No answer when call for triage

## 2017-04-25 NOTE — ED Triage Notes (Signed)
Pt states she woke up with palpitations and increase HR, denies any dizziness, SOB or pain at this time. Pt AO x 4 NAD noticed.

## 2017-04-25 NOTE — ED Notes (Signed)
Pt updated on wait & plan of care 

## 2017-05-29 ENCOUNTER — Ambulatory Visit (HOSPITAL_COMMUNITY)
Admission: EM | Admit: 2017-05-29 | Discharge: 2017-05-29 | Disposition: A | Discharge status: Home / Self Care | Payer: BLUE CROSS/BLUE SHIELD | Attending: Family Medicine | Admitting: Family Medicine

## 2017-05-29 ENCOUNTER — Encounter (HOSPITAL_COMMUNITY): Payer: Self-pay | Admitting: Family Medicine

## 2017-05-29 DIAGNOSIS — L509 Urticaria, unspecified: Secondary | ICD-10-CM | POA: Diagnosis not present

## 2017-05-29 MED ORDER — PREDNISONE 10 MG (21) PO TBPK: ORAL_TABLET | Freq: Every day | ORAL | 0 refills | Status: DC

## 2017-05-29 NOTE — ED Provider Notes (Signed)
  [facility]   [id number] 05/29/17 Arrival Time: 1003  ASSESSMENT & PLAN:  1. Hives     Meds ordered this encounter  Medications  . predniSONE (STERAPRED UNI-PAK 21 TAB) 10 MG (21) TBPK tablet    Sig: Take by mouth daily. Take as directed.    Dispense:  21 tablet    Refill:  0   Will f/u in 24 hours if not showing improvement. Work note given. Reviewed expectations re: course of current medical issues. Questions answered. Outlined signs and symptoms indicating need for more acute intervention. Patient verbalized understanding. After Visit Summary given.   SUBJECTIVE:  Denise Arias is a 36 y.o. female who presents with complaint of itchy rash to her hands, feet, and upper legs. H/O similar in past that responded to an OTC allergy medication. This time rash began yesterday. No new exposures. Questions subjective fever. No self-treatment. No change in symptoms today. No n/v/respiratory difficulties. No specific aggravating or alleviating factors reported.  ROS: As per HPI.   OBJECTIVE:  Vitals:   05/29/17 1021  BP: 113/77  Pulse: 86  Temp: 98.3 F (36.8 C)  TempSrc: Oral  SpO2: 95%    General appearance: alert; no distress Eyes: conjunctiva normal Lungs: clear to auscultation bilaterally Heart: regular rate and rhythm Extremities: no cyanosis or edema; symmetrical with no gross deformities Skin: wheals over hands/feet/upper legs Psychological: alert and cooperative; normal mood and affect   Allergies  Allergen Reactions  . Amoxicillin Nausea And Vomiting  . Multivitamins Swelling    sd her stomach/intestinal area swelled up after 2 hrs.  . Banana Hives  . Fish Oil Rash  . Strawberry Extract Rash    Past Medical History:  Diagnosis Date  . Allergy    RHINITIS  . GERD (gastroesophageal reflux disease)    Social History   Social History  . Marital status: Married    Spouse name: N/A  . Number of children: N/A  . Years of education:  N/A   Occupational History  . Not on file.   Social History Main Topics  . Smoking status: Never Smoker  . Smokeless tobacco: Never Used  . Alcohol use No  . Drug use: No  . Sexual activity: Yes    Birth control/ protection: Implant     Comment: last week   Other Topics Concern  . Not on file   Social History Narrative  . No narrative on file   Family History  Problem Relation Age of Onset  . Diabetes Mother   . Thyroid disease Sister        goiter  . Other Brother        murdered   Past Surgical History:  Procedure Laterality Date  . VAGINAL DELIVERY     X 4     Mardella Layman, MD 05/29/17 1041

## 2017-05-29 NOTE — ED Triage Notes (Signed)
Pt reports having a rash on her hands and feet, her legs and arms.  She states it is painful and itchy and started yesterday.  She reports having a fever last night but it was not measured.

## 2017-05-31 ENCOUNTER — Ambulatory Visit (HOSPITAL_COMMUNITY)
Admission: EM | Admit: 2017-05-31 | Discharge: 2017-05-31 | Disposition: A | Discharge status: Home / Self Care | Payer: BLUE CROSS/BLUE SHIELD

## 2017-05-31 ENCOUNTER — Encounter (HOSPITAL_COMMUNITY): Payer: Self-pay | Admitting: *Deleted

## 2017-05-31 DIAGNOSIS — R21 Rash and other nonspecific skin eruption: Secondary | ICD-10-CM

## 2017-05-31 DIAGNOSIS — M2550 Pain in unspecified joint: Secondary | ICD-10-CM | POA: Diagnosis not present

## 2017-05-31 DIAGNOSIS — Z5321 Procedure and treatment not carried out due to patient leaving prior to being seen by health care provider: Secondary | ICD-10-CM

## 2017-05-31 DIAGNOSIS — K2900 Acute gastritis without bleeding: Secondary | ICD-10-CM | POA: Diagnosis not present

## 2017-05-31 DIAGNOSIS — R1013 Epigastric pain: Secondary | ICD-10-CM | POA: Diagnosis present

## 2017-05-31 MED ORDER — TRIAMCINOLONE ACETONIDE 40 MG/ML IJ SUSP
40.0000 mg | Freq: Once | INTRAMUSCULAR | Status: AC
  Administered 2017-05-31: 40 mg via INTRAMUSCULAR

## 2017-05-31 MED ORDER — TRIAMCINOLONE ACETONIDE 40 MG/ML IJ SUSP
INTRAMUSCULAR | Status: AC
  Filled 2017-05-31: qty 1

## 2017-05-31 MED ORDER — HYDROXYZINE HCL 25 MG PO TABS: 25.0000 mg | ORAL_TABLET | Freq: Four times a day (QID) | ORAL | 0 refills | Status: DC

## 2017-05-31 NOTE — Discharge Instructions (Signed)
The cause for The specific type of rash in conjunction with your joint pain is uncertain. Although there may be some allergy component to it does not appear to be hives at this time. Call your PCP on Monday for an appointment. He will may need blood work to investigate different conditions that may cause this including immunologic/autoimmune disorders. In the meantime take the Atarax as directed and hold the Benadryl. Continue taking the prednisone taper dose and he will receive an injection of steroid today.

## 2017-05-31 NOTE — ED Triage Notes (Signed)
C/O generalized pruritis x 3 days; was seen in [facility] 2 days ago and started on prednisone; states not helping at all.  Has been using hydrocortisone cream.

## 2017-05-31 NOTE — ED Provider Notes (Addendum)
[facility]    CSN: [id number] Arrival date & time: 05/31/17  1212     History   Chief Complaint Chief Complaint  Patient presents with  . Rash    HPI Denise Arias is a 36 y.o. female.   36 year old female developed polyarthralgias, red rash and pruritus about 3 days ago. On the first day she presented to the urgent care was diagnosed with hives and treated with tapering steroid pack. She returns today primarily due to the persistent itching. She states that the joint pains particularly in the feet, ankles, wrists feel a little better. Also Benadryl tends to help with itching as well as topical hydrocortisone to some areas that only for very short time. However, she continues to have a macular erythema to the hands lesser to the feet and around the anterior neck. Denies generalized swelling, problems swallowing, intraoral swelling. Denies papular rash.      Past Medical History:  Diagnosis Date  . Allergy    RHINITIS  . GERD (gastroesophageal reflux disease)     Patient Active Problem List   Diagnosis Date Noted  . Allergic reaction 03/03/2017  . Skin sensitivity 03/03/2017  . Medication reaction, subsequent encounter 03/03/2017  . Frequent headaches 11/15/2016  . Palpitations 11/15/2016  . Neck pain 11/15/2016  . Family history of thyroid disease in sister 11/15/2016  . Sleep disturbance 11/15/2016  . Fatigue 11/15/2016  . Heat intolerance 11/15/2016  . 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 GERD (gastroesophageal reflux disease) 05/27/2013    Past Surgical History:  Procedure Laterality Date  . VAGINAL DELIVERY     X 4    OB History    Gravida Para Term Preterm AB Living   SAB TAB Ectopic Multiple Live Births         0 1       Home Medications    Prior to Admission medications   Medication Sig Start Date End Date Taking? Authorizing Provider  predniSONE (STERAPRED UNI-PAK 21 TAB) 10 MG (21) TBPK tablet Take by mouth  daily. Take as directed. 05/29/17  Yes Mardella Layman, MD  acetaminophen (TYLENOL) 650 MG CR tablet Take 650 mg by mouth every 8 (eight) hours as needed for pain.    [provider]  hydrOXYzine (ATARAX/VISTARIL) 25 MG tablet Take 1 tablet (25 mg total) by mouth every 6 (six) hours. 05/31/17   Hayden Rasmussen, NP  minocycline (MINOCIN,DYNACIN) 100 MG capsule Take 100 mg by mouth 2 (two) times daily as needed.    [provider]    Family History Family History  Problem Relation Age of Onset  . Diabetes Mother   . Thyroid disease Sister        goiter  . Other Brother        murdered    Social History Social History  Substance Use Topics  . Smoking status: Never Smoker  . Smokeless tobacco: Never Used  . Alcohol use No     Allergies   Amoxicillin; Multivitamins; Banana; Fish oil; and Strawberry extract   Review of Systems Review of Systems  Constitutional:       Equivocal subjective fever on a couple of occasions in the past 3 days because she felt cold.  HENT: Negative.   Eyes: Negative.   Respiratory: Negative for cough, shortness of breath and wheezing.   Cardiovascular: Negative for chest pain and leg swelling.  Gastrointestinal: Negative.   Genitourinary: Negative.  Musculoskeletal: Positive for arthralgias.  Skin: Positive for rash.  Neurological: Negative.   All other systems reviewed and are negative.    Physical Exam Triage Vital Signs ED Triage Vitals [05/31/17 1255]  Enc Vitals Group     BP 117/84     Pulse Rate (!) 102     Resp 18     Temp 98.7 F (37.1 C)     Temp Source Oral     SpO2 98 %     Weight      Height      Head Circumference      Peak Flow      Pain Score      Pain Loc      Pain Edu?      Excl. in GC?    No data found.   Updated Vital Signs BP 117/84   Pulse (!) 102   Temp 98.7 F (37.1 C) (Oral)   Resp 18   LMP 05/23/2017 (Exact Date)   SpO2 98%   Visual Acuity Right Eye Distance:   Left Eye Distance:     Bilateral Distance:    Right Eye Near:   Left Eye Near:    Bilateral Near:     Physical Exam  Constitutional: She is oriented to person, place, and time. She appears well-developed and well-nourished. No distress.  HENT:  Head: Normocephalic and atraumatic.  Mouth/Throat: Oropharynx is clear and moist. No oropharyngeal exudate.  Eyes: EOM are normal.  Neck: Normal range of motion. Neck supple.  Cardiovascular: Normal rate and regular rhythm.   Pulmonary/Chest: Effort normal and breath sounds normal.  Musculoskeletal: Normal range of motion. She exhibits no edema or deformity.  Tenderness to the palms and anterior wrist bilaterally although much improved in the past couple days. Tenderness to the dorsal aspect of the feet and plantar aspect of the feet.  Neurological: She is alert and oriented to person, place, and time. No cranial nerve deficit.  Skin: Skin is warm and dry. Rash noted.  Palmar erythema, macular and blanching, faint blotchy erythema to the anterior neck. (Patient states it has improved over the past couple of hours after applying topical hydrocortisone 1% cream). No wheals are seen. No palpable lesions. No desquamation, peeling or vesicles.  Psychiatric: She has a normal mood and affect.  Nursing note and vitals reviewed.    UC Treatments / Results  Labs (all labs ordered are listed, but only abnormal results are displayed) Labs Reviewed - No data to display  EKG  EKG Interpretation None       Radiology No results found.  Procedures Procedures (including critical care time)  Medications Ordered in UC Medications  triamcinolone acetonide (KENALOG-40) injection 40 mg (not administered)     Initial Impression / Assessment and Plan / UC Course  I have reviewed the triage vital signs and the nursing notes.  Pertinent labs & imaging results that were available during my care of the patient were reviewed by me and considered in my medical decision  making (see chart for details).    The cause for The specific type of rash in conjunction with your joint pain is uncertain. Although there may be some allergy component to it does not appear to be hives at this time. Call your PCP on Monday for an appointment. He will may need blood work to investigate different conditions that may cause this including immunologic/autoimmune disorders. In the meantime take the Atarax as directed and hold the Benadryl. Continue taking  the prednisone taper dose and he will receive an injection of steroid today.    Final Clinical Impressions(s) / UC Diagnoses   Final diagnoses:  Rash and nonspecific skin eruption  Polyarthralgia    New Prescriptions New Prescriptions   HYDROXYZINE (ATARAX/VISTARIL) 25 MG TABLET    Take 1 tablet (25 mg total) by mouth every 6 (six) hours.     Controlled Substance Prescriptions [place] Controlled Substance Registry consulted? Not Applicable   Hayden Rasmussen, NP 05/31/17 1427    Hayden Rasmussen, NP 05/31/17 1428

## 2017-06-01 ENCOUNTER — Encounter (HOSPITAL_COMMUNITY): Payer: Self-pay | Admitting: Emergency Medicine

## 2017-06-01 ENCOUNTER — Emergency Department (HOSPITAL_COMMUNITY)
Admission: EM | Admit: 2017-06-01 | Discharge: 2017-06-01 | Disposition: A | Discharge status: Home / Self Care | Payer: BLUE CROSS/BLUE SHIELD | Attending: Emergency Medicine | Admitting: Emergency Medicine

## 2017-06-01 ENCOUNTER — Emergency Department (HOSPITAL_COMMUNITY)
Admission: EM | Admit: 2017-06-01 | Discharge: 2017-06-01 | Disposition: A | Discharge status: Home / Self Care | Payer: BLUE CROSS/BLUE SHIELD | Source: Home / Self Care

## 2017-06-01 DIAGNOSIS — K2900 Acute gastritis without bleeding: Secondary | ICD-10-CM | POA: Insufficient documentation

## 2017-06-01 LAB — URINALYSIS, ROUTINE W REFLEX MICROSCOPIC
BILIRUBIN URINE: NEGATIVE
Bacteria, UA: NONE SEEN
GLUCOSE, UA: NEGATIVE mg/dL
Hgb urine dipstick: NEGATIVE
KETONES UR: NEGATIVE mg/dL
Nitrite: NEGATIVE
PH: 6 (ref 5.0–8.0)
Protein, ur: NEGATIVE mg/dL
SPECIFIC GRAVITY, URINE: 1.013 (ref 1.005–1.030)

## 2017-06-01 LAB — CBC
HCT: 37.4 % (ref 36.0–46.0)
HEMOGLOBIN: 12.5 g/dL (ref 12.0–15.0)
MCH: 28.9 pg (ref 26.0–34.0)
MCHC: 33.4 g/dL (ref 30.0–36.0)
MCV: 86.4 fL (ref 78.0–100.0)
PLATELETS: 245 10*3/uL (ref 150–400)
RBC: 4.33 MIL/uL (ref 3.87–5.11)
RDW: 12.5 % (ref 11.5–15.5)
WBC: 11.7 10*3/uL — AB (ref 4.0–10.5)

## 2017-06-01 LAB — COMPREHENSIVE METABOLIC PANEL
ALK PHOS: 46 U/L (ref 38–126)
ALT: 18 U/L (ref 14–54)
ANION GAP: 9 (ref 5–15)
AST: 18 U/L (ref 15–41)
Albumin: 3.5 g/dL (ref 3.5–5.0)
BILIRUBIN TOTAL: 0.2 mg/dL — AB (ref 0.3–1.2)
BUN: 17 mg/dL (ref 6–20)
CALCIUM: 8.8 mg/dL — AB (ref 8.9–10.3)
CO2: 25 mmol/L (ref 22–32)
Chloride: 103 mmol/L (ref 101–111)
Creatinine, Ser: 0.57 mg/dL (ref 0.44–1.00)
Glucose, Bld: 95 mg/dL (ref 65–99)
Potassium: 3.8 mmol/L (ref 3.5–5.1)
SODIUM: 137 mmol/L (ref 135–145)
TOTAL PROTEIN: 6.9 g/dL (ref 6.5–8.1)

## 2017-06-01 LAB — LIPASE, BLOOD: Lipase: 18 U/L (ref 11–51)

## 2017-06-01 LAB — I-STAT BETA HCG BLOOD, ED (MC, WL, AP ONLY): I-stat hCG, quantitative: <5 m[IU]/mL (ref <5)

## 2017-06-01 MED ORDER — GI COCKTAIL ~~LOC~~
30.0000 mL | Freq: Once | ORAL | Status: AC
  Administered 2017-06-01: 30 mL via ORAL
  Filled 2017-06-01: qty 30

## 2017-06-01 MED ORDER — FAMOTIDINE 20 MG PO TABS
20.0000 mg | ORAL_TABLET | Freq: Once | ORAL | Status: AC
  Administered 2017-06-01: 20 mg via ORAL
  Filled 2017-06-01: qty 1

## 2017-06-01 MED ORDER — FAMOTIDINE 20 MG PO TABS: 20.0000 mg | ORAL_TABLET | Freq: Two times a day (BID) | ORAL | 0 refills | Status: DC

## 2017-06-01 NOTE — ED Provider Notes (Signed)
[facility] Provider Note   CSN: [id number] Arrival date & time: 06/01/17  1343     History   Chief Complaint Chief Complaint  Patient presents with  . Abdominal Pain    HPI Denise Arias is a 36 y.o. female.  Pt presents to the ED today with abdominal pain for the past 2 days.  She was recently put on prednisone and atarax for a rash.  After taking the medications, she c/o pain to her epigastrium.  She was here last night, waited for hours, but left b/c she had to get her child.  The pt did have blood work done last night, but did not get results.  The pt said she continues to have pain, so she came back.      Past Medical History:  Diagnosis Date  . Allergy    RHINITIS  . GERD (gastroesophageal reflux disease)     Patient Active Problem List   Diagnosis Date Noted  . Allergic reaction 03/03/2017  . Skin sensitivity 03/03/2017  . Medication reaction, subsequent encounter 03/03/2017  . Frequent headaches 11/15/2016  . Palpitations 11/15/2016  . Neck pain 11/15/2016  . Family history of thyroid disease in sister 11/15/2016  . Sleep disturbance 11/15/2016  . Fatigue 11/15/2016  . Heat intolerance 11/15/2016  . 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 GERD (gastroesophageal reflux disease) 05/27/2013    Past Surgical History:  Procedure Laterality Date  . VAGINAL DELIVERY     X 4    OB History    Gravida Para Term Preterm AB Living   SAB TAB Ectopic Multiple Live Births         0 1       Home Medications    Prior to Admission medications   Medication Sig Start Date End Date Taking? Authorizing Provider  acetaminophen (TYLENOL) 650 MG CR tablet Take 650 mg by mouth every 8 (eight) hours as needed for pain.    [provider]  famotidine (PEPCID) 20 MG tablet Take 1 tablet (20 mg total) by mouth 2 (two) times daily. 06/01/17   Jacalyn Lefevre, MD  hydrOXYzine (ATARAX/VISTARIL) 25 MG tablet Take 1 tablet (25 mg total) by  mouth every 6 (six) hours. 05/31/17   Hayden Rasmussen, NP  minocycline (MINOCIN,DYNACIN) 100 MG capsule Take 100 mg by mouth 2 (two) times daily as needed.    [provider]  predniSONE (STERAPRED UNI-PAK 21 TAB) 10 MG (21) TBPK tablet Take by mouth daily. Take as directed. 05/29/17   Mardella Layman, MD    Family History Family History  Problem Relation Age of Onset  . Diabetes Mother   . Thyroid disease Sister        goiter  . Other Brother        murdered    Social History Social History  Substance Use Topics  . Smoking status: Never Smoker  . Smokeless tobacco: Never Used  . Alcohol use No     Allergies   Amoxicillin; Multivitamins; Banana; Fish oil; and Strawberry extract   Review of Systems Review of Systems  Gastrointestinal: Positive for abdominal pain.  All other systems reviewed and are negative.    Physical Exam Updated Vital Signs BP 127/80 (BP Location: Left Arm)   Pulse 66   Temp 97.8 F (36.6 C) (Oral)   Resp 16   LMP 05/23/2017 (Exact Date)   SpO2 100%   Physical Exam  Constitutional: She is  oriented to person, place, and time. She appears well-developed and well-nourished.  HENT:  Head: Normocephalic and atraumatic.  Right Ear: External ear normal.  Left Ear: External ear normal.  Nose: Nose normal.  Mouth/Throat: Oropharynx is clear and moist.  Eyes: Pupils are equal, round, and reactive to light. Conjunctivae and EOM are normal.  Neck: Normal range of motion. Neck supple.  Cardiovascular: Normal rate, regular rhythm, normal heart sounds and intact distal pulses.   Pulmonary/Chest: Effort normal and breath sounds normal.  Abdominal: Soft. Bowel sounds are normal.  Musculoskeletal: Normal range of motion.  Neurological: She is alert and oriented to person, place, and time.  Skin: Skin is warm.  Psychiatric: She has a normal mood and affect. Her behavior is normal. Judgment and thought content normal.  Nursing note and vitals  reviewed.    ED Treatments / Results  Labs (all labs ordered are listed, but only abnormal results are displayed) Labs Reviewed - No data to display  EKG  EKG Interpretation None       Radiology No results found.  Procedures Procedures (including critical care time)  Medications Ordered in ED Medications  gi cocktail (Maalox,Lidocaine,Donnatal) (30 mLs Oral Given 06/01/17 1929)  famotidine (PEPCID) tablet 20 mg (20 mg Oral Given 06/01/17 1929)     Initial Impression / Assessment and Plan / ED Course  I have reviewed the triage vital signs and the nursing notes.  Pertinent labs & imaging results that were available during my care of the patient were reviewed by me and considered in my medical decision making (see chart for details).    Pt requested no more blood to be drawn today.  No IV.  No Xray.  Pt's sx gone with gi cocktail and pepcid.  She is ready to go.  I suspect prednisone caused sx, so she's told to stop it.  She knows to return if worse.  Final Clinical Impressions(s) / ED Diagnoses   Final diagnoses:  Other acute gastritis without hemorrhage    New Prescriptions New Prescriptions   FAMOTIDINE (PEPCID) 20 MG TABLET    Take 1 tablet (20 mg total) by mouth 2 (two) times daily.     Jacalyn Lefevre, MD 06/01/17 2012

## 2017-06-01 NOTE — Discharge Instructions (Signed)
Stop prednisone

## 2017-06-01 NOTE — ED Triage Notes (Addendum)
Patient c/o burning in abdomen to esophagus x2 days. Hx GERD. Reports symptoms began after taking new medications prescribed for allergic reaction. Denies N/V/D, chest pain, and SOB. Speaking in full sentences.   Patient refusing additional blood work today. Reports blood work was drawn yesterday when she left without being seen because of the long wait time.

## 2017-06-01 NOTE — ED Notes (Signed)
Pt walked out saying, "I can't wait any longer."

## 2017-08-21 IMAGING — CT CT HEAD W/O CM
4 series · 17 of 47 positions shown, 19 images · non-contrast
Comparison: 01/16/2011

CLINICAL DATA: Right posterior head pain for 2 days, history of
headaches

EXAM:
CT HEAD WITHOUT CONTRAST
TECHNIQUE: Contiguous axial images were obtained from the base of the skull
through the vertex without intravenous contrast.

[Series 2: head without · axial · non-contrast · 0.44mm/px · z∈[+567,+687]mm · 7 of 33 slices shown, 9 images]
[im 5/33  brain]
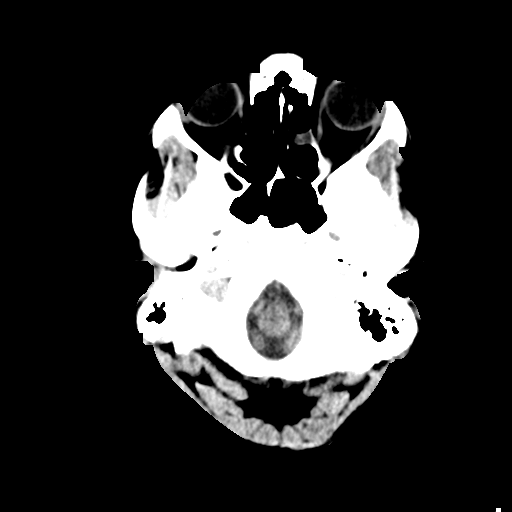
[im 5/33  bone]
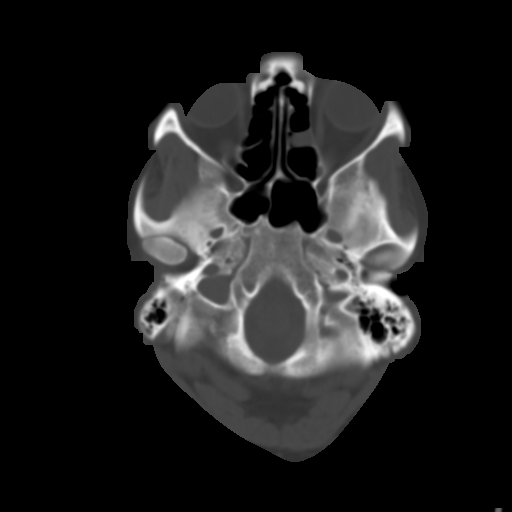
[im 9/33  brain]
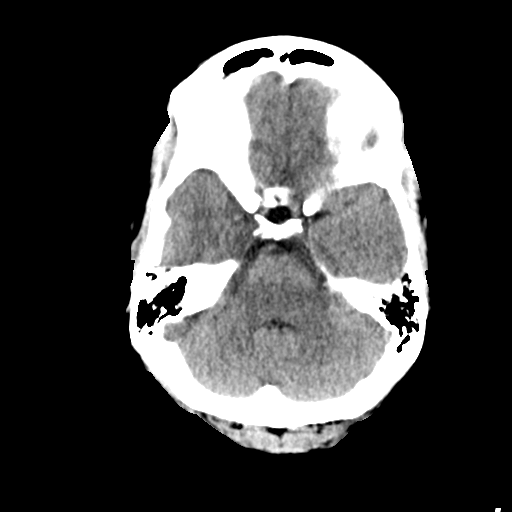
[im 13/33  brain]
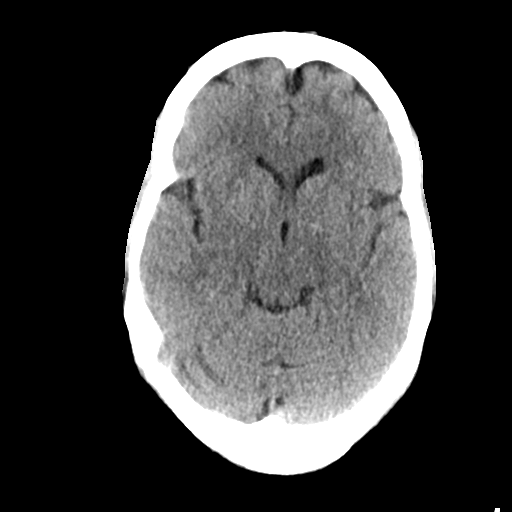
[im 17/33  brain]
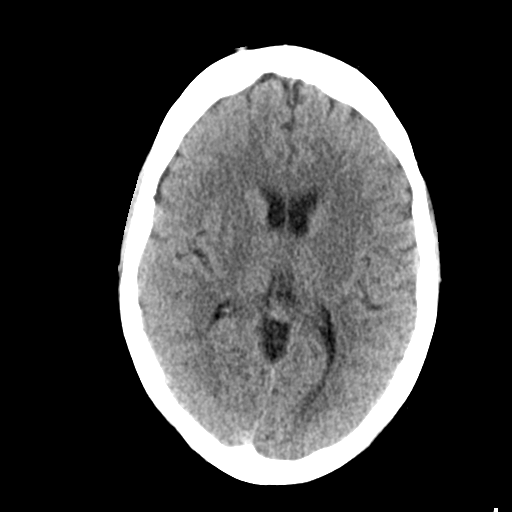
[im 21/33  brain]
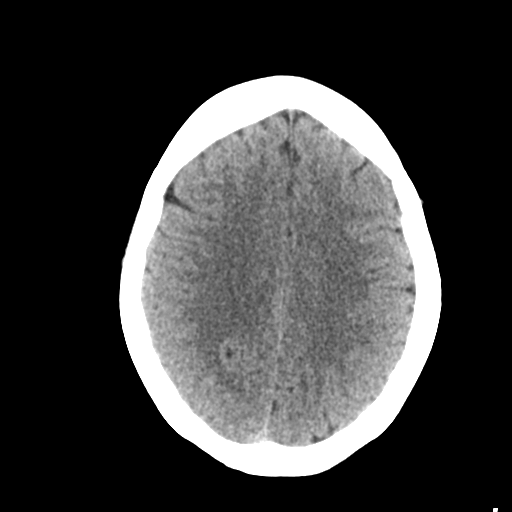
[im 21/33  bone]
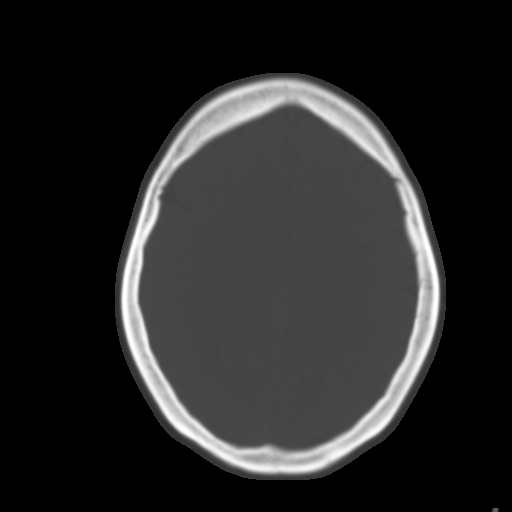
[im 25/33  brain]
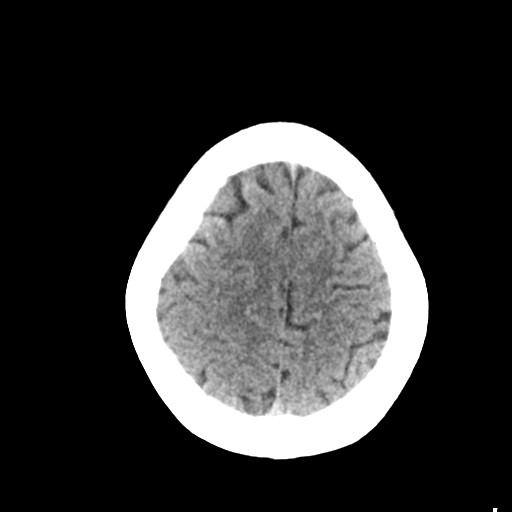
[im 29/33  brain]
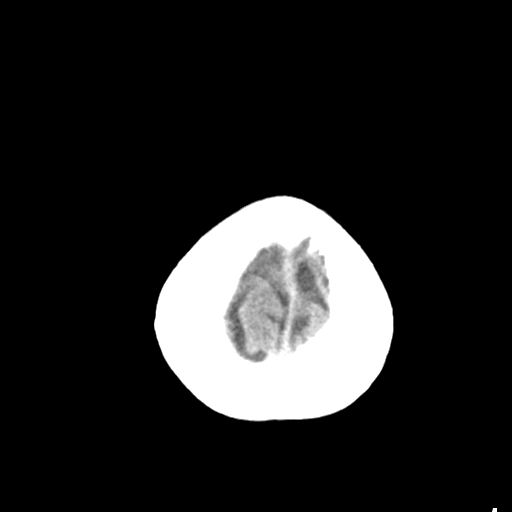

[Series 3: head bone · axial · 0.44mm/px · z∈[+563,+619]mm · 4 of 82 slices shown]
[im 9/82  bone]
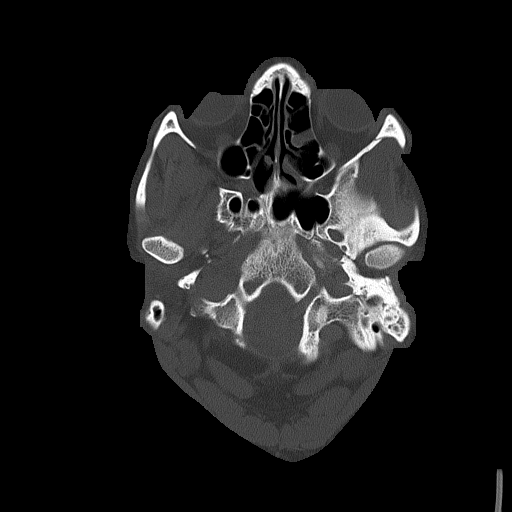
[im 17/82  bone]
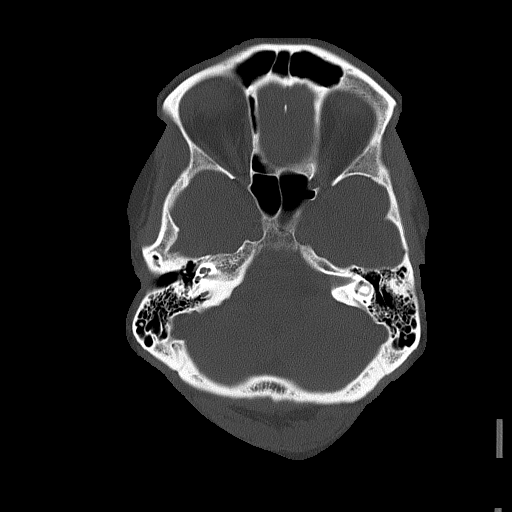
[im 25/82  bone]
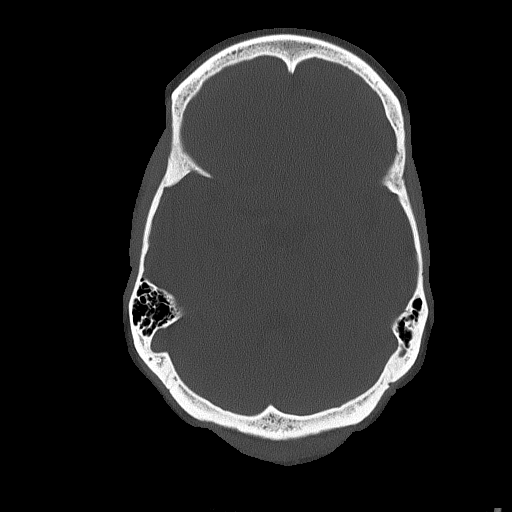
[im 37/82  bone]
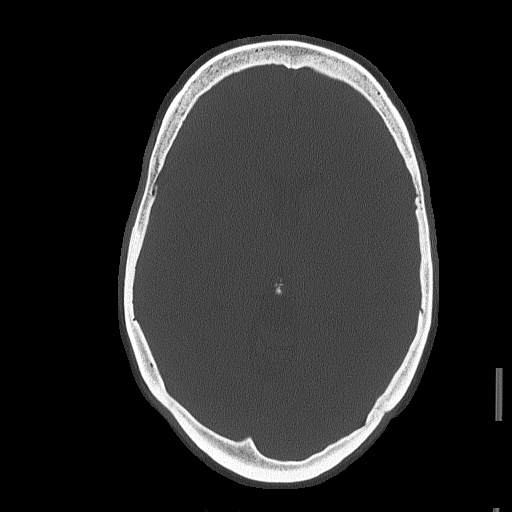

[Series 4: head without cor · coronal · non-contrast · 0.32mm/px · 3 of 67 slices shown]
[im 23/67  brain]
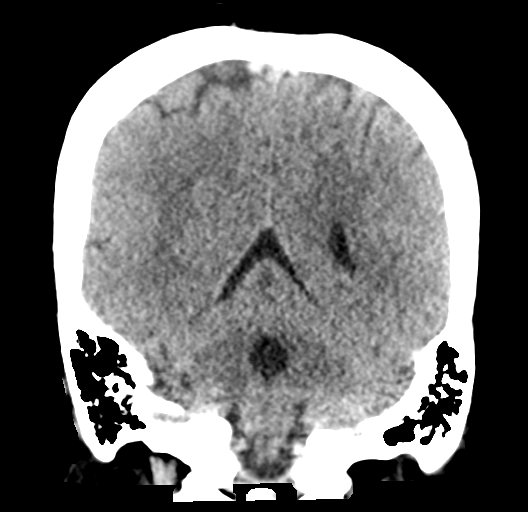
[im 30/67  brain]
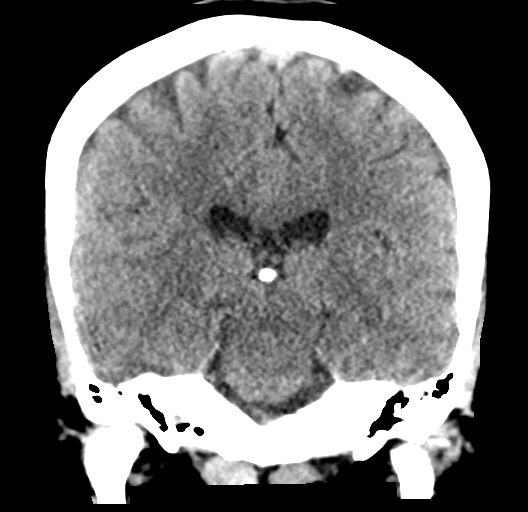
[im 37/67  brain]
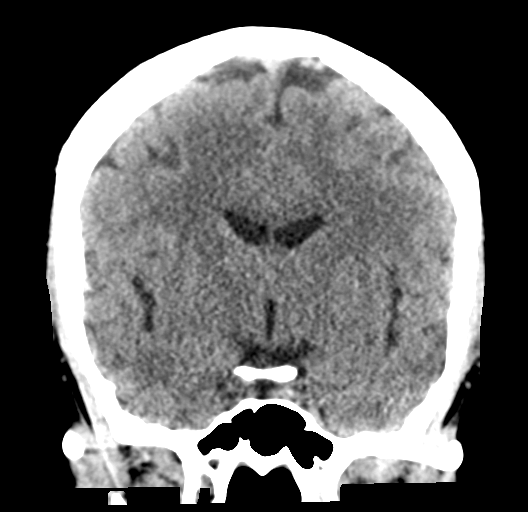

[Series 5: head without sag · sagittal · non-contrast · 0.32mm/px · 3 of 55 slices shown]
[im 19/55  brain]
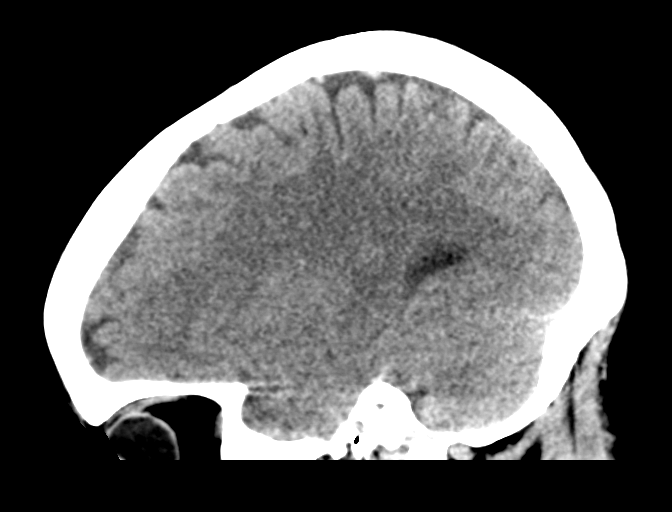
[im 28/55  brain]
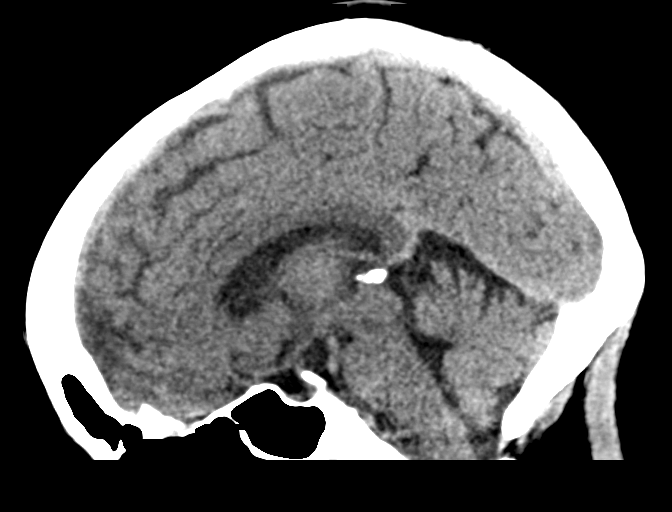
[im 37/55  brain]
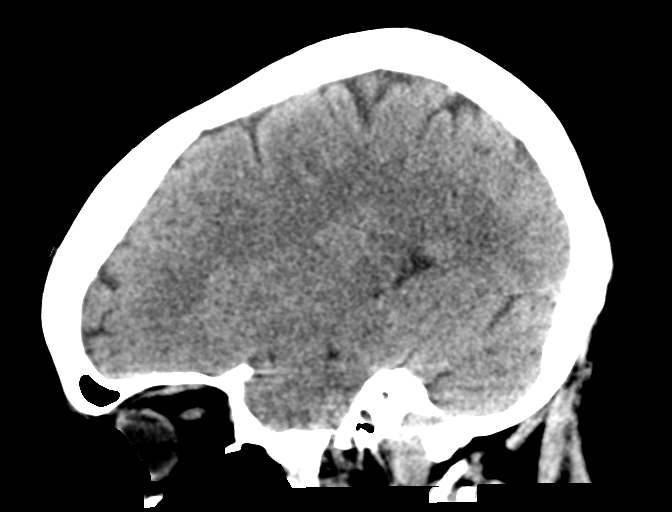

[17 of 47 positions shown; findings below may reference images not displayed]

FINDINGS: Brain: No evidence of acute infarction, hemorrhage, hydrocephalus,
extra-axial collection or mass lesion/mass effect.

Vascular: No hyperdense vessel or unexpected calcification.

Skull: No osseous abnormality.

Sinuses/Orbits: Visualized paranasal sinuses are clear. Visualized
mastoid sinuses are clear. Visualized orbits demonstrate no focal
abnormality.

Other: None
IMPRESSION: No acute intracranial pathology.

## 2017-08-31 ENCOUNTER — Inpatient Hospital Stay (HOSPITAL_COMMUNITY)
Admission: AD | Admit: 2017-08-31 | Discharge: 2017-08-31 | Disposition: A | Discharge status: Home / Self Care | Payer: BLUE CROSS/BLUE SHIELD | Source: Ambulatory Visit | Attending: Obstetrics & Gynecology | Admitting: Obstetrics & Gynecology

## 2017-08-31 ENCOUNTER — Encounter (HOSPITAL_COMMUNITY): Payer: Self-pay

## 2017-08-31 DIAGNOSIS — Z8349 Family history of other endocrine, nutritional and metabolic diseases: Secondary | ICD-10-CM | POA: Diagnosis not present

## 2017-08-31 DIAGNOSIS — B9689 Other specified bacterial agents as the cause of diseases classified elsewhere: Secondary | ICD-10-CM | POA: Insufficient documentation

## 2017-08-31 DIAGNOSIS — N760 Acute vaginitis: Secondary | ICD-10-CM | POA: Diagnosis not present

## 2017-08-31 DIAGNOSIS — Z880 Allergy status to penicillin: Secondary | ICD-10-CM | POA: Diagnosis not present

## 2017-08-31 DIAGNOSIS — Z91018 Allergy to other foods: Secondary | ICD-10-CM | POA: Insufficient documentation

## 2017-08-31 DIAGNOSIS — K219 Gastro-esophageal reflux disease without esophagitis: Secondary | ICD-10-CM | POA: Diagnosis not present

## 2017-08-31 DIAGNOSIS — Z833 Family history of diabetes mellitus: Secondary | ICD-10-CM | POA: Diagnosis not present

## 2017-08-31 DIAGNOSIS — N939 Abnormal uterine and vaginal bleeding, unspecified: Secondary | ICD-10-CM | POA: Diagnosis present

## 2017-08-31 LAB — WET PREP, GENITAL
Sperm: NONE SEEN
Trich, Wet Prep: NONE SEEN
YEAST WET PREP: NONE SEEN

## 2017-08-31 LAB — POCT PREGNANCY, URINE: PREG TEST UR: NEGATIVE

## 2017-08-31 LAB — URINALYSIS, ROUTINE W REFLEX MICROSCOPIC
BACTERIA UA: NONE SEEN
BILIRUBIN URINE: NEGATIVE
Glucose, UA: NEGATIVE mg/dL
Ketones, ur: NEGATIVE mg/dL
NITRITE: NEGATIVE
PROTEIN: NEGATIVE mg/dL
Specific Gravity, Urine: 1.025 (ref 1.005–1.030)
pH: 6 (ref 5.0–8.0)

## 2017-08-31 MED ORDER — METRONIDAZOLE 500 MG PO TABS: 500.0000 mg | ORAL_TABLET | Freq: Two times a day (BID) | ORAL | 0 refills | Status: DC

## 2017-08-31 NOTE — MAU Provider Note (Signed)
History     CSN: [id number]  Arrival date and time: 08/31/17 [id number]   First Provider Initiated Contact with Patient 08/31/17 1959      Chief Complaint  Patient presents with  . Vaginal Bleeding   36 y.o. non-pregnant female here with spotting and vaginal odor. She noticed these sx today. It is not time for her menses. She also reports painful IC x2 months. She thinks her husband may have had sexual relations with another woman. She is using the birth control patch, this is her second month. She had some intermenstrual spotting last month too but not odor. Had trich in 2016 with same partner. LMP 08/07/17.   Past Medical History:  Diagnosis Date  . Allergy    RHINITIS  . GERD (gastroesophageal reflux disease)     Past Surgical History:  Procedure Laterality Date  . VAGINAL DELIVERY     X 4    Family History  Problem Relation Age of Onset  . Diabetes Mother   . Thyroid disease Sister        goiter  . Other Brother        murdered    Social History   Tobacco Use  . Smoking status: Never Smoker  . Smokeless tobacco: Never Used  Substance Use Topics  . Alcohol use: No  . Drug use: No    Allergies:  Allergies  Allergen Reactions  . Amoxicillin Nausea And Vomiting  . Multivitamins Swelling    sd her stomach/intestinal area swelled up after 2 hrs.  . Banana Hives  . Fish Oil Rash  . Strawberry Extract Rash    Medications Prior to Admission  Medication Sig Dispense Refill Last Dose  . acetaminophen (TYLENOL) 650 MG CR tablet Take 650 mg by mouth every 8 (eight) hours as needed for pain.   Unknown at Unknown time  . famotidine (PEPCID) 20 MG tablet Take 1 tablet (20 mg total) by mouth 2 (two) times daily. 30 tablet 0 Unknown at Unknown time  . hydrOXYzine (ATARAX/VISTARIL) 25 MG tablet Take 1 tablet (25 mg total) by mouth every 6 (six) hours. 15 tablet 0 Unknown at Unknown time  . minocycline (MINOCIN,DYNACIN) 100 MG capsule Take 100 mg by mouth 2 (two) times  daily as needed.   Unknown at Unknown time  . predniSONE (STERAPRED UNI-PAK 21 TAB) 10 MG (21) TBPK tablet Take by mouth daily. Take as directed. 21 tablet 0 Unknown at Unknown time    Review of Systems  Gastrointestinal: Negative for abdominal pain.  Genitourinary: Positive for dyspareunia, vaginal bleeding and vaginal discharge.   Physical Exam   Blood pressure 111/64, pulse 65, temperature 97.8 F (36.6 C), temperature source Oral, resp. rate 16, height 5\' 5"  (1.651 m), weight 182 lb (82.6 kg), SpO2 99 %.  Physical Exam  Constitutional: She is oriented to person, place, and time. She appears well-developed and well-nourished. No distress.  HENT:  Head: Normocephalic and atraumatic.  Neck: Normal range of motion.  Cardiovascular: Normal rate.  Respiratory: Effort normal. No respiratory distress.  Genitourinary:  Genitourinary Comments: External: no lesions or erythema, female circumcision Vagina: rugated, pink, moist, moderate brown discharge, cervix bled with q-tip contact Uterus: non enlarged, anteverted, non tender, no CMT Adnexae: no masses, no tenderness left, no tenderness right   Musculoskeletal: Normal range of motion.  Neurological: She is alert and oriented to person, place, and time.  Skin: Skin is warm and dry.  Psychiatric: She has a normal mood and affect.  Results for orders placed or performed during the hospital encounter of 08/31/17 (from the past 24 hour(s))  Urinalysis, Routine w reflex microscopic     Status: Abnormal   Collection Time: 08/31/17  7:45 PM  Result Value Ref Range   Color, Urine YELLOW YELLOW   APPearance HAZY (A) CLEAR   Specific Gravity, Urine 1.025 1.005 - 1.030   pH 6.0 5.0 - 8.0   Glucose, UA NEGATIVE NEGATIVE mg/dL   Hgb urine dipstick SMALL (A) NEGATIVE   Bilirubin Urine NEGATIVE NEGATIVE   Ketones, ur NEGATIVE NEGATIVE mg/dL   Protein, ur NEGATIVE NEGATIVE mg/dL   Nitrite NEGATIVE NEGATIVE   Leukocytes, UA MODERATE (A)  NEGATIVE   RBC / HPF 0-5 0 - 5 RBC/hpf   WBC, UA 6-30 0 - 5 WBC/hpf   Bacteria, UA NONE SEEN NONE SEEN   Squamous Epithelial / LPF 0-5 (A) NONE SEEN   Mucus PRESENT   Pregnancy, urine POC     Status: None   Collection Time: 08/31/17  7:52 PM  Result Value Ref Range   Preg Test, Ur NEGATIVE NEGATIVE  Wet prep, genital     Status: Abnormal   Collection Time: 08/31/17  8:10 PM  Result Value Ref Range   Yeast Wet Prep HPF POC NONE SEEN NONE SEEN   Trich, Wet Prep NONE SEEN NONE SEEN   Clue Cells Wet Prep HPF POC PRESENT (A) NONE SEEN   WBC, Wet Prep HPF POC MODERATE (A) NONE SEEN   Sperm NONE SEEN    MAU Course  Procedures  MDM Labs ordered and reviewed. Discussed some breakthrough bleeding can be normal when first starting birth control. Will treat for BV. Cultures pending. Stable for discharge home.  Assessment and Plan   1. Bacterial vaginosis    [facility] Flag[facility]  Allergies as of 08/31/2017      Reactions   Amoxicillin Nausea And Vomiting   Multivitamins Swelling   sd her stomach/intestinal area swelled up after 2 hrs.   Banana Hives   Fish Oil Rash   Strawberry Extract Rash      Medication List    STOP taking these medications   famotidine 20 MG tablet Commonly known as:  PEPCID   hydrOXYzine 25 MG tablet Commonly known as:  ATARAX/VISTARIL   minocycline 100 MG capsule Commonly known as:  MINOCIN,DYNACIN   predniSONE 10 MG (21) Tbpk tablet Commonly known as:  STERAPRED UNI-PAK 21 TAB     TAKE these medications   acetaminophen 650 MG CR tablet Commonly known as:  TYLENOL Take 650 mg by mouth every 8 (eight) hours as needed for pain.   metroNIDAZOLE 500 MG tablet Commonly known as:  FLAGYL Take 1 tablet (500 mg total) by mouth 2 (two) times daily.      Donette LarryMelanie Adrien Shankar, CNM 08/31/2017, 8:42 PM

## 2017-08-31 NOTE — Discharge Instructions (Signed)

## 2017-08-31 NOTE — MAU Note (Signed)
Last period ended Dec 11th. Scant bleeding today with foul odor.

## 2017-09-01 LAB — GC/CHLAMYDIA PROBE AMP (~~LOC~~) NOT AT ARMC
Chlamydia: NEGATIVE
Neisseria Gonorrhea: NEGATIVE

## 2017-11-05 ENCOUNTER — Other Ambulatory Visit: Payer: Self-pay | Admitting: Family Medicine

## 2017-11-05 DIAGNOSIS — N6320 Unspecified lump in the left breast, unspecified quadrant: Secondary | ICD-10-CM

## 2018-04-20 DIAGNOSIS — Z79899 Other long term (current) drug therapy: Secondary | ICD-10-CM | POA: Diagnosis not present

## 2018-04-20 DIAGNOSIS — R51 Headache: Secondary | ICD-10-CM | POA: Insufficient documentation

## 2018-04-21 ENCOUNTER — Encounter (HOSPITAL_COMMUNITY): Payer: Self-pay | Admitting: *Deleted

## 2018-04-21 ENCOUNTER — Other Ambulatory Visit: Payer: Self-pay

## 2018-04-21 ENCOUNTER — Emergency Department (HOSPITAL_COMMUNITY)
Admission: EM | Admit: 2018-04-21 | Discharge: 2018-04-21 | Disposition: A | Discharge status: Home / Self Care | Payer: BLUE CROSS/BLUE SHIELD | Attending: Emergency Medicine | Admitting: Emergency Medicine

## 2018-04-21 DIAGNOSIS — R51 Headache: Secondary | ICD-10-CM

## 2018-04-21 DIAGNOSIS — R519 Headache, unspecified: Secondary | ICD-10-CM

## 2018-04-21 MED ORDER — ONDANSETRON 4 MG PO TBDP
4.0000 mg | ORAL_TABLET | Freq: Once | ORAL | Status: AC
  Administered 2018-04-21: 4 mg via ORAL
  Filled 2018-04-21: qty 1

## 2018-04-21 MED ORDER — KETOROLAC TROMETHAMINE 30 MG/ML IJ SOLN
30.0000 mg | Freq: Once | INTRAMUSCULAR | Status: AC
  Administered 2018-04-21: 30 mg via INTRAMUSCULAR
  Filled 2018-04-21: qty 1

## 2018-04-21 NOTE — ED Triage Notes (Signed)
Pt states that she has had a headache since yesterday. She does not feel well. She has some dizziness. Her headache has improved since she has arrived to the ED. She has been taking tylenol.

## 2018-04-21 NOTE — ED Provider Notes (Signed)
[facility]-EMERGENCY DEPT Provider Note   CSN: [id number] Arrival date & time: 04/20/18  2257     History   Chief Complaint Chief Complaint  Patient presents with  . Headache    HPI Denise Arias is a 37 y.o. female.  HPI  This is a 37 year old female with a history of allergies, headaches who presents with headache.  Patient reports 3-day history of headache.  This is somewhat different than her normal headaches.  She reports that it is over the top of her head and feels like someone is pulling on her hair.  She has taken Tylenol which did improve her pain prior to evaluation.  Currently her pain is 2 out of 10.  Headache has been ongoing.  Does not seem to get better or worse with anything.  She denies any vision changes, weakness, numbness, tingling, strokelike symptoms.  She denies any neck stiffness or fevers.  Past Medical History:  Diagnosis Date  . Allergy    RHINITIS  . GERD (gastroesophageal reflux disease)     Patient Active Problem List   Diagnosis Date Noted  . Allergic reaction 03/03/2017  . Skin sensitivity 03/03/2017  . Medication reaction, subsequent encounter 03/03/2017  . Frequent headaches 11/15/2016  . Palpitations 11/15/2016  . Neck pain 11/15/2016  . Family history of thyroid disease in sister 11/15/2016  . Sleep disturbance 11/15/2016  . Fatigue 11/15/2016  . Heat intolerance 11/15/2016  . 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 GERD (gastroesophageal reflux disease) 05/27/2013    Past Surgical History:  Procedure Laterality Date  . VAGINAL DELIVERY     X 4     OB History    Gravida  5   Para  5   Term  5   Preterm      AB      Living  5     SAB      TAB      Ectopic      Multiple  0   Live Births  1            Home Medications    Prior to Admission medications   Medication Sig Start Date End Date Taking? Authorizing Provider  acetaminophen (TYLENOL) 650 MG CR tablet Take 650 mg by  mouth every 8 (eight) hours as needed for pain.    [provider]  metroNIDAZOLE (FLAGYL) 500 MG tablet Take 1 tablet (500 mg total) by mouth 2 (two) times daily. 08/31/17   Donette LarryBhambri, Melanie, CNM    Family History Family History  Problem Relation Age of Onset  . Diabetes Mother   . Thyroid disease Sister        goiter  . Other Brother        murdered    Social History Social History   Tobacco Use  . Smoking status: Never Smoker  . Smokeless tobacco: Never Used  Substance Use Topics  . Alcohol use: No  . Drug use: No     Allergies   Amoxicillin; Multivitamins; Banana; Fish oil; and Strawberry extract   Review of Systems Review of Systems  Constitutional: Negative for fever.  Eyes: Negative for photophobia and visual disturbance.  Respiratory: Negative for shortness of breath.   Cardiovascular: Negative for chest pain.  Gastrointestinal: Positive for nausea. Negative for abdominal pain and vomiting.  Musculoskeletal: Negative for neck pain and neck stiffness.  Neurological: Positive for headaches.  All other systems reviewed and are negative.    Physical Exam  Updated Vital Signs BP 120/77   Pulse 76   Temp 98.1 F (36.7 C) (Oral)   Resp 16   LMP 04/09/2018   SpO2 100%   Physical Exam  Constitutional: She is oriented to person, place, and time. She appears well-developed and well-nourished.  Overweight  HENT:  Head: Normocephalic and atraumatic.  Eyes: Pupils are equal, round, and reactive to light. EOM are normal.  Neck: Normal range of motion. Neck supple.  No meningismus  Cardiovascular: Normal rate, regular rhythm and normal heart sounds.  Pulmonary/Chest: Effort normal. No respiratory distress.  Abdominal: Soft. There is no tenderness.  Neurological: She is alert and oriented to person, place, and time.  Cranial nerves II through XII intact, 5 out of 5 strength in all 4 extremities, no dysmetria to finger-nose-finger  Skin: Skin is warm  and dry.  Psychiatric: She has a normal mood and affect.  Nursing note and vitals reviewed.    ED Treatments / Results  Labs (all labs ordered are listed, but only abnormal results are displayed) Labs Reviewed - No data to display  EKG None  Radiology No results found.  Procedures Procedures (including critical care time)  Medications Ordered in ED Medications  ketorolac (TORADOL) 30 MG/ML injection 30 mg (30 mg Intramuscular Given 04/21/18 0310)  ondansetron (ZOFRAN-ODT) disintegrating tablet 4 mg (4 mg Oral Given 04/21/18 0310)     Initial Impression / Assessment and Plan / ED Course  I have reviewed the triage vital signs and the nursing notes.  Pertinent labs & imaging results that were available during my care of the patient were reviewed by me and considered in my medical decision making (see chart for details).     Patient presents with headache.  Overall nontoxic-appearing and vital signs are reassuring.  Neurologic exam is normal.  Patient reports headache for several days but did have some improvement prior to arrival.  Currently she is controlled at 2 out of 10.  Patient was given IM Toradol and Zofran.  On recheck, she states she feels much better.  Given normal neurologic exam and no red flags, do not feel she needs further work-up.  Low suspicion for subarachnoid hemorrhage, meningitis, or other acute emergent process.  After history, exam, and medical workup I feel the patient has been appropriately medically screened and is safe for discharge home. Pertinent diagnoses were discussed with the patient. Patient was given return precautions.   Final Clinical Impressions(s) / ED Diagnoses   Final diagnoses:  Acute nonintractable headache, unspecified headache type    ED Discharge Orders    None       Shon BatonHorton, Keyonia Gluth F, MD 04/21/18 [phone]

## 2018-04-21 NOTE — ED Notes (Signed)
ED Provider at bedside. 

## 2018-04-21 NOTE — ED Notes (Signed)
Bed: [id number] Expected date:  Expected time:  Means of arrival:  Comments: Hold

## 2019-05-09 ENCOUNTER — Emergency Department (HOSPITAL_COMMUNITY)
Admission: EM | Admit: 2019-05-09 | Discharge: 2019-05-10 | Disposition: A | Discharge status: Home / Self Care | Payer: BLUE CROSS/BLUE SHIELD | Attending: Emergency Medicine | Admitting: Emergency Medicine

## 2019-05-09 ENCOUNTER — Encounter (HOSPITAL_COMMUNITY): Payer: Self-pay

## 2019-05-09 ENCOUNTER — Other Ambulatory Visit: Payer: Self-pay

## 2019-05-09 DIAGNOSIS — R1033 Periumbilical pain: Secondary | ICD-10-CM | POA: Diagnosis not present

## 2019-05-09 DIAGNOSIS — Z79899 Other long term (current) drug therapy: Secondary | ICD-10-CM | POA: Insufficient documentation

## 2019-05-09 LAB — URINALYSIS, ROUTINE W REFLEX MICROSCOPIC
Bilirubin Urine: NEGATIVE
Glucose, UA: NEGATIVE mg/dL
Hgb urine dipstick: NEGATIVE
Ketones, ur: NEGATIVE mg/dL
Leukocytes,Ua: NEGATIVE
Nitrite: NEGATIVE
Protein, ur: NEGATIVE mg/dL
Specific Gravity, Urine: 1.011 (ref 1.005–1.030)
pH: 6 (ref 5.0–8.0)

## 2019-05-09 LAB — CBC
HCT: 36.4 % (ref 36.0–46.0)
Hemoglobin: 11.8 g/dL — ABNORMAL LOW (ref 12.0–15.0)
MCH: 29.1 pg (ref 26.0–34.0)
MCHC: 32.4 g/dL (ref 30.0–36.0)
MCV: 89.9 fL (ref 80.0–100.0)
Platelets: 227 10*3/uL (ref 150–400)
RBC: 4.05 MIL/uL (ref 3.87–5.11)
RDW: 12.8 % (ref 11.5–15.5)
WBC: 8.9 10*3/uL (ref 4.0–10.5)
nRBC: 0 % (ref 0.0–0.2)

## 2019-05-09 LAB — I-STAT BETA HCG BLOOD, ED (MC, WL, AP ONLY): I-stat hCG, quantitative: <5 m[IU]/mL (ref <5)

## 2019-05-09 MED ORDER — SODIUM CHLORIDE 0.9% FLUSH: 3.0000 mL | Freq: Once | INTRAVENOUS | Status: DC

## 2019-05-09 NOTE — ED Triage Notes (Signed)
Pt reports pain in her umbilicus. States that she lifted something heavy yesterday and the pain started today. She feels like she has to hold the area to take a deep breath. Denies N/V/D. Denies vaginal bleeding or discharge.

## 2019-05-09 NOTE — ED Notes (Signed)
Urine culture sent to the lab. 

## 2019-05-10 ENCOUNTER — Emergency Department (HOSPITAL_COMMUNITY): Payer: BLUE CROSS/BLUE SHIELD

## 2019-05-10 ENCOUNTER — Encounter (HOSPITAL_COMMUNITY): Payer: Self-pay

## 2019-05-10 LAB — COMPREHENSIVE METABOLIC PANEL
ALT: 20 U/L (ref 0–44)
AST: 20 U/L (ref 15–41)
Albumin: 3.8 g/dL (ref 3.5–5.0)
Alkaline Phosphatase: 59 U/L (ref 38–126)
Anion gap: 6 (ref 5–15)
BUN: 18 mg/dL (ref 6–20)
CO2: 25 mmol/L (ref 22–32)
Calcium: 8.8 mg/dL — ABNORMAL LOW (ref 8.9–10.3)
Chloride: 108 mmol/L (ref 98–111)
Creatinine, Ser: 0.68 mg/dL (ref 0.44–1.00)
GFR calc Af Amer: >60 mL/min (ref >60)
GFR calc non Af Amer: >60 mL/min (ref >60)
Glucose, Bld: 124 mg/dL — ABNORMAL HIGH (ref 70–99)
Potassium: 3.6 mmol/L (ref 3.5–5.1)
Sodium: 139 mmol/L (ref 135–145)
Total Bilirubin: 0.1 mg/dL — ABNORMAL LOW (ref 0.3–1.2)
Total Protein: 7.3 g/dL (ref 6.5–8.1)

## 2019-05-10 LAB — LIPASE, BLOOD: Lipase: 29 U/L (ref 11–51)

## 2019-05-10 MED ORDER — SODIUM CHLORIDE (PF) 0.9 % IJ SOLN
INTRAMUSCULAR | Status: AC
  Filled 2019-05-10: qty 50

## 2019-05-10 MED ORDER — IOHEXOL 300 MG/ML  SOLN
100.0000 mL | Freq: Once | INTRAMUSCULAR | Status: AC | PRN
  Administered 2019-05-10: 02:00:00 100 mL via INTRAVENOUS

## 2019-05-10 NOTE — ED Provider Notes (Signed)
TIME SEEN: 12:01 AM  CHIEF COMPLAINT: Abdominal pain  HPI: Patient is a 38 year old female with [id number] significant past medical history who presents to the emergency department with abdominal pain around the umbilicus that started today.  States yesterday she lifted a heavy suitcase and thinks this could have contributed to her pain.  She has not noticed any bulging in this area.  She describes it as a sharp pain.  No fevers, nausea, vomiting, diarrhea, dysuria, hematuria, vaginal bleeding or discharge.  No history of previous abdominal surgery.  Patient declines Arabic interpreter.  ROS: See HPI Constitutional: no fever  Eyes: no drainage  ENT: no runny nose   Cardiovascular:  no chest pain  Resp: no SOB  GI: no vomiting GU: no dysuria Integumentary: no rash  Allergy: no hives  Musculoskeletal: no leg swelling  Neurological: no slurred speech ROS otherwise negative  PAST MEDICAL HISTORY/PAST SURGICAL HISTORY:  Past Medical History:  Diagnosis Date  . Allergy    RHINITIS  . GERD (gastroesophageal reflux disease)     MEDICATIONS:  Prior to Admission medications   Medication Sig Start Date End Date Taking? Authorizing Provider  acetaminophen (TYLENOL) 650 MG CR tablet Take 650 mg by mouth every 8 (eight) hours as needed for pain.    [provider]  metroNIDAZOLE (FLAGYL) 500 MG tablet Take 1 tablet (500 mg total) by mouth 2 (two) times daily. 08/31/17   Donette LarryBhambri, Melanie, CNM    ALLERGIES:  Allergies  Allergen Reactions  . Amoxicillin Nausea And Vomiting  . Multivitamins Swelling    sd her stomach/intestinal area swelled up after 2 hrs.  . Banana Hives  . Fish Oil Rash  . Strawberry Extract Rash    SOCIAL HISTORY:  Social History   Tobacco Use  . Smoking status: Never Smoker  . Smokeless tobacco: Never Used  Substance Use Topics  . Alcohol use: No    FAMILY HISTORY: Family History  Problem Relation Age of Onset  . Diabetes Mother   . Thyroid disease  Sister        goiter  . Other Brother        murdered    EXAM: BP (!) 147/89   Pulse 72   Temp 98.3 F (36.8 C) (Oral)   Resp 16   Ht 5\' 4"  (1.626 m)   Wt 87.7 kg   SpO2 99%   BMI 33.20 kg/m  CONSTITUTIONAL: Alert and oriented and responds appropriately to questions. Well-appearing; well-nourished HEAD: Normocephalic EYES: Conjunctivae clear, pupils appear equal, EOMI ENT: normal nose; moist mucous membranes NECK: Supple, no meningismus, no nuchal rigidity, no LAD  CARD: RRR; S1 and S2 appreciated; no murmurs, no clicks, no rubs, no gallops RESP: Normal chest excursion without splinting or tachypnea; breath sounds clear and equal bilaterally; no wheezes, no rhonchi, no rales, no hypoxia or respiratory distress, speaking full sentences ABD/GI: Normal bowel sounds; non-distended; soft, tender to palpation upon deep palpation around her umbilicus without hernia or mass appreciated, no redness or warmth, umbilicus appears normal, no rebound, no guarding, no peritoneal signs, no hepatosplenomegaly BACK:  The back appears normal and is non-tender to palpation, there is no CVA tenderness EXT: Normal ROM in all joints; non-tender to palpation; no edema; normal capillary refill; no cyanosis, no calf tenderness or swelling    SKIN: Normal color for age and race; warm; no rash NEURO: Moves all extremities equally PSYCH: The patient's mood and manner are appropriate. Grooming and personal hygiene are appropriate.  MEDICAL DECISION  MAKING: Patient here with pain around the umbilicus.  This sounds like it could be a hernia but I do not appreciate a hernia on exam and she is actually quite tender to deep palpation of the umbilicus.  Skin around this area appears normal.  Will obtain CT of her abdomen pelvis for further evaluation.  Discussed with patient that this also could be early appendicitis.  She is not currently tender in the right lower quadrant.  Differential also includes colitis, bowel  obstruction, diverticulitis.  Labs, urine obtained in triage are unremarkable.  She declines any pain medication at this time.   ED PROGRESS: Patient CT scan appears normal.  No hernia.  No appendicitis.  I feel she is safe to be discharged home.  Discussed with patient that this could have been a pulled muscle.  Discussed return precautions.  Recommended alternating Tylenol and Motrin for pain.  She verbalized understanding and is comfortable with this plan.   At this time, I do not feel there is any life-threatening condition present. I have reviewed and discussed all results (EKG, imaging, lab, urine as appropriate) and exam findings with patient/family. I have reviewed nursing notes and appropriate previous records.  I feel the patient is safe to be discharged home without further emergent workup and can continue workup as an outpatient as needed. Discussed usual and customary return precautions. Patient/family verbalize understanding and are comfortable with this plan.  Outpatient follow-up has been provided as needed. All questions have been answered.      Deanne Bedgood, Delice Bison, DO 05/10/19 [phone]

## 2019-05-10 NOTE — Discharge Instructions (Addendum)
Your labs, urine, CT scan today were normal.  You may alternate Tylenol 1000 mg every 6 hours as needed for pain and Ibuprofen 800 mg every 8 hours as needed for pain.  Please take Ibuprofen with food.   Steps to find a Primary Care Provider (PCP):  Call [phone] or [phone] to access "[place] [facility]."  2.  You may also go on the [facility] website at [email or web address]  3.  [facility] also frequently accepts new patients.  [facility] [place] [phone]  4.  There are also multiple [facility], [facility] and [facility] practices throughout the [place] that are frequently accepting new patients. You may find a clinic that is close to your home and contact them.  [facility] [email or web address] [phone]   [facility] [email or web address]  [email or web address] [place] [email or web address] [phone]  5.  Local Health Departments also can provide primary care services.  [facility]  [place] [address] [phone]  [facility] [place] [address] [place] [facility] [place] [phone]

## 2019-10-21 ENCOUNTER — Emergency Department (HOSPITAL_COMMUNITY): Payer: Self-pay

## 2019-10-21 ENCOUNTER — Emergency Department (HOSPITAL_COMMUNITY)
Admission: EM | Admit: 2019-10-21 | Discharge: 2019-10-21 | Disposition: A | Discharge status: Home / Self Care | Payer: Self-pay | Attending: Emergency Medicine | Admitting: Emergency Medicine

## 2019-10-21 ENCOUNTER — Other Ambulatory Visit: Payer: Self-pay

## 2019-10-21 DIAGNOSIS — M62830 Muscle spasm of back: Secondary | ICD-10-CM | POA: Insufficient documentation

## 2019-10-21 DIAGNOSIS — R1013 Epigastric pain: Secondary | ICD-10-CM | POA: Insufficient documentation

## 2019-10-21 LAB — COMPREHENSIVE METABOLIC PANEL
ALT: 14 U/L (ref 0–44)
AST: 18 U/L (ref 15–41)
Albumin: 4 g/dL (ref 3.5–5.0)
Alkaline Phosphatase: 64 U/L (ref 38–126)
Anion gap: 8 (ref 5–15)
BUN: 13 mg/dL (ref 6–20)
CO2: 24 mmol/L (ref 22–32)
Calcium: 8.9 mg/dL (ref 8.9–10.3)
Chloride: 106 mmol/L (ref 98–111)
Creatinine, Ser: 0.59 mg/dL (ref 0.44–1.00)
GFR calc Af Amer: >60 mL/min (ref >60)
GFR calc non Af Amer: >60 mL/min (ref >60)
Glucose, Bld: 150 mg/dL — ABNORMAL HIGH (ref 70–99)
Potassium: 3.5 mmol/L (ref 3.5–5.1)
Sodium: 138 mmol/L (ref 135–145)
Total Bilirubin: 0.3 mg/dL (ref 0.3–1.2)
Total Protein: 7.4 g/dL (ref 6.5–8.1)

## 2019-10-21 LAB — CBC WITH DIFFERENTIAL/PLATELET
Abs Immature Granulocytes: 0.03 10*3/uL (ref 0.00–0.07)
Basophils Absolute: 0 10*3/uL (ref 0.0–0.1)
Basophils Relative: 1 %
Eosinophils Absolute: 0.1 10*3/uL (ref 0.0–0.5)
Eosinophils Relative: 2 %
HCT: 38.5 % (ref 36.0–46.0)
Hemoglobin: 12.6 g/dL (ref 12.0–15.0)
Immature Granulocytes: 0 %
Lymphocytes Relative: 20 %
Lymphs Abs: 1.6 10*3/uL (ref 0.7–4.0)
MCH: 28.5 pg (ref 26.0–34.0)
MCHC: 32.7 g/dL (ref 30.0–36.0)
MCV: 87.1 fL (ref 80.0–100.0)
Monocytes Absolute: 0.7 10*3/uL (ref 0.1–1.0)
Monocytes Relative: 9 %
Neutro Abs: 5.6 10*3/uL (ref 1.7–7.7)
Neutrophils Relative %: 68 %
Platelets: 253 10*3/uL (ref 150–400)
RBC: 4.42 MIL/uL (ref 3.87–5.11)
RDW: 12.7 % (ref 11.5–15.5)
WBC: 8.1 10*3/uL (ref 4.0–10.5)
nRBC: 0 % (ref 0.0–0.2)

## 2019-10-21 LAB — I-STAT BETA HCG BLOOD, ED (MC, WL, AP ONLY): I-stat hCG, quantitative: <5 m[IU]/mL (ref <5)

## 2019-10-21 LAB — LIPASE, BLOOD: Lipase: 23 U/L (ref 11–51)

## 2019-10-21 MED ORDER — ALUM & MAG HYDROXIDE-SIMETH 200-200-20 MG/5ML PO SUSP: 30.0000 mL | Freq: Once | ORAL | Status: DC

## 2019-10-21 MED ORDER — METHOCARBAMOL 500 MG PO TABS: 500.0000 mg | ORAL_TABLET | Freq: Two times a day (BID) | ORAL | 0 refills | Status: DC

## 2019-10-21 MED ORDER — METHOCARBAMOL 500 MG PO TABS
500.0000 mg | ORAL_TABLET | Freq: Once | ORAL | Status: AC
  Administered 2019-10-21: 500 mg via ORAL
  Filled 2019-10-21: qty 1

## 2019-10-21 MED ORDER — KETOROLAC TROMETHAMINE 30 MG/ML IJ SOLN: 30.0000 mg | Freq: Once | INTRAMUSCULAR | Status: DC

## 2019-10-21 MED ORDER — KETOROLAC TROMETHAMINE 30 MG/ML IJ SOLN
30.0000 mg | Freq: Once | INTRAMUSCULAR | Status: AC
  Administered 2019-10-21: 30 mg via INTRAMUSCULAR
  Filled 2019-10-21: qty 1

## 2019-10-21 MED ORDER — ACETAMINOPHEN 325 MG PO TABS
650.0000 mg | ORAL_TABLET | Freq: Once | ORAL | Status: AC
  Administered 2019-10-21: 650 mg via ORAL
  Filled 2019-10-21: qty 2

## 2019-10-21 NOTE — ED Notes (Signed)
Pt transported to xray 

## 2019-10-21 NOTE — ED Provider Notes (Signed)
[facility]-EMERGENCY DEPT Provider Note   CSN: [id number] Arrival date & time: 10/21/19  2021     History Chief Complaint  Patient presents with  . Back Pain    since 2 pm    Denise Arias is a 39 y.o. female with a past medical history of GERD presenting to the ED with chief complaint of back pain and epigastric pain for the past 6 hours.  Reports gradual onset of back pain that is sharp, worse with movement and palpation.  She took 1 dose of Aleve with some improvement in her symptoms but states that the pain has not been completely resolved.  She reports history of similar pain in the past and was told that it could be due to gallstones.  This was approximately 5 years ago.  She was cleaning her children's room today when the pain began. She denies any injuries, falls, chest pain, shortness of breath, vomiting, diarrhea, fever, history of DVT, PE, MI, leg swelling or recent immobilization.  HPI     Past Medical History:  Diagnosis Date  . Allergy    RHINITIS  . GERD (gastroesophageal reflux disease)     Patient Active Problem List   Diagnosis Date Noted  . Allergic reaction 03/03/2017  . Skin sensitivity 03/03/2017  . Medication reaction, subsequent encounter 03/03/2017  . Frequent headaches 11/15/2016  . Palpitations 11/15/2016  . Neck pain 11/15/2016  . Family history of thyroid disease in sister 11/15/2016  . Sleep disturbance 11/15/2016  . Fatigue 11/15/2016  . Heat intolerance 11/15/2016  . 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 GERD (gastroesophageal reflux disease) 05/27/2013    Past Surgical History:  Procedure Laterality Date  . VAGINAL DELIVERY     X 4     OB History    Gravida  5   Para  5   Term  5   Preterm      AB      Living  5     SAB      TAB      Ectopic      Multiple  0   Live Births  1           Family History  Problem Relation Age of Onset  . Diabetes Mother   . Thyroid disease Sister       goiter  . Other Brother        murdered    Social History   Tobacco Use  . Smoking status: Never Smoker  . Smokeless tobacco: Never Used  Substance Use Topics  . Alcohol use: No  . Drug use: No    Home Medications Prior to Admission medications   Medication Sig Start Date End Date Taking? Authorizing Provider  acetaminophen (TYLENOL) 650 MG CR tablet Take 650 mg by mouth every 8 (eight) hours as needed for pain.    [provider]  methocarbamol (ROBAXIN) 500 MG tablet Take 1 tablet (500 mg total) by mouth 2 (two) times daily. 10/21/19   Taym Twist, PA-C  metroNIDAZOLE (FLAGYL) 500 MG tablet Take 1 tablet (500 mg total) by mouth 2 (two) times daily. 08/31/17   Donette Larry, CNM    Allergies    Amoxicillin, Multivitamins, Banana, Fish oil, and Strawberry extract  Review of Systems   Review of Systems  Constitutional: Negative for appetite change, chills and fever.  HENT: Negative for ear pain, rhinorrhea, sneezing and sore throat.   Eyes: Negative for photophobia and visual disturbance.  Respiratory: Negative for cough, chest tightness, shortness of breath and wheezing.   Cardiovascular: Negative for chest pain and palpitations.  Gastrointestinal: Positive for abdominal pain. Negative for blood in stool, constipation, diarrhea, nausea and vomiting.  Genitourinary: Negative for dysuria, hematuria and urgency.  Musculoskeletal: Positive for back pain. Negative for myalgias.  Skin: Negative for rash.  Neurological: Negative for dizziness, weakness and light-headedness.    Physical Exam Updated Vital Signs BP 129/67   Pulse 77   Temp 98.5 F (36.9 C) (Oral)   Resp 18   Ht 5\' 4"  (1.626 m)   Wt 88 kg   LMP 10/14/2019   SpO2 98%   BMI 33.30 kg/m   Physical Exam Vitals and nursing note reviewed.  Constitutional:      General: She is not in acute distress.    Appearance: She is well-developed.     Comments: Speaking in complete sentences without  difficulty.  No signs of respiratory distress or airway compromise.  HENT:     Head: Normocephalic and atraumatic.     Nose: Nose normal.  Eyes:     General: No scleral icterus.       Left eye: No discharge.     Conjunctiva/sclera: Conjunctivae normal.  Cardiovascular:     Rate and Rhythm: Normal rate and regular rhythm.     Heart sounds: Normal heart sounds. No murmur. No friction rub. No gallop.   Pulmonary:     Effort: Pulmonary effort is normal. No respiratory distress.     Breath sounds: Normal breath sounds.  Abdominal:     General: Bowel sounds are normal. There is no distension.     Palpations: Abdomen is soft.     Tenderness: There is abdominal tenderness (epigastric). There is no guarding.  Musculoskeletal:        General: No swelling or tenderness. Normal range of motion.     Cervical back: Normal range of motion and neck supple.       Back:     Right lower leg: No edema.     Left lower leg: No edema.     Comments: No midline spinal tenderness present in lumbar, thoracic or cervical spine. No step-off palpated. No visible bruising, edema or temperature change noted. No objective signs of numbness present. No saddle anesthesia. 2+ DP pulses bilaterally. Sensation intact to light touch. Strength 5/5 in bilateral lower extremities.  Skin:    General: Skin is warm and dry.     Findings: No rash.  Neurological:     Mental Status: She is alert.     Motor: No abnormal muscle tone.     Coordination: Coordination normal.     ED Results / Procedures / Treatments   Labs (all labs ordered are listed, but only abnormal results are displayed) Labs Reviewed  COMPREHENSIVE METABOLIC PANEL - Abnormal; Notable for the following components:      Result Value   Glucose, Bld 150 (*)    All other components within normal limits  LIPASE, BLOOD  CBC WITH DIFFERENTIAL/PLATELET  I-STAT BETA HCG BLOOD, ED ([facility], [facility], [facility] ONLY)    EKG EKG Interpretation  Date/Time:  Thursday October 21 2019 20:53:49 EST Ventricular Rate:  67 PR Interval:    QRS Duration: 73 QT Interval:  410 QTC Calculation: 433 R Axis:   38 Text Interpretation: Sinus arrhythmia Ventricular premature complex Low voltage, precordial leads Abnormal R-wave progression, early transition Otherwise within normal limits Confirmed by Carmin Muskrat [phone]) on 10/21/2019 8:57:17 PM  Radiology DG Chest 2 View  Result Date: 10/21/2019 CLINICAL DATA:  39 year old female with epigastric and back pain since 1400 hours. EXAM: CHEST - 2 VIEW COMPARISON:  Chest radiographs 06/04/2016 and earlier. FINDINGS: Lung volumes and mediastinal contours remain normal. Visualized tracheal air column is within normal limits. No pneumothorax, pleural effusion or confluent pulmonary opacity. Stable mild increased pulmonary interstitium since 2017. No acute opacity. Negative visible bowel gas and osseous structures. IMPRESSION: No acute cardiopulmonary abnormality. Electronically Signed   By: Odessa Fleming M.D.   On: 10/21/2019 21:00    Procedures Procedures (including critical care time)  Medications Ordered in ED Medications  methocarbamol (ROBAXIN) tablet 500 mg (500 mg Oral Given 10/21/19 2149)  acetaminophen (TYLENOL) tablet 650 mg (650 mg Oral Given 10/21/19 2147)  ketorolac (TORADOL) 30 MG/ML injection 30 mg (30 mg Intramuscular Given 10/21/19 2149)    ED Course  I have reviewed the triage vital signs and the nursing notes.  Pertinent labs & imaging results that were available during my care of the patient were reviewed by me and considered in my medical decision making (see chart for details).    MDM Rules/Calculators/A&P                      39 year old female presenting to the ED with upper back pain epigastric pain that began earlier today while she was cleaning her children's room.  Denies any chest pain.  States that the biggest complaint is the back pain.  Reports history of similar symptoms in the past and was told it  was due to her gallstones.  She denies any nausea, vomiting, changes to bowel movements, shortness of breath or cough.  On exam there is tenderness palpation of the upper back and pain is worse with movement.  Some epigastric discomfort as well.  Vital signs are reassuring.  Will check lab work, chest x-ray, EKG and reassess.  Chart review shows that her CT scan last year did not show any evidence of gallstones or other gallbladder pathology.  EKG without any changes from prior tracings.  Chest x-ray unremarkable.  CBC, CMP, lipase unremarkable.  hCG is negative.  Patient symptoms controlled here with NSAIDs, Robaxin and Tylenol.  Suspect that her symptoms are musculoskeletal in nature.  I doubt PE, ACS, pancreatitis, cholecystitis or other emergent cause of her symptoms as her work-up is reassuring, her symptoms have improved and her vital signs are reassuring. Will have her f/u with PCP and return for worsening symptoms.  Patient is hemodynamically stable, in NAD, and able to ambulate in the ED. Evaluation does not show pathology that would require ongoing emergent intervention or inpatient treatment. I have personally reviewed and interpreted all lab work and imaging at today's ED visit. I explained the diagnosis to the patient. Pain has been managed and has no complaints prior to discharge. Patient is comfortable with above plan and is stable for discharge at this time. All questions were answered prior to disposition. Strict return precautions for returning to the ED were discussed. Encouraged follow up with PCP.   An After Visit Summary was printed and given to the patient.   Portions of this note were generated with Scientist, clinical (histocompatibility and immunogenetics). Dictation errors may occur despite best attempts at proofreading.  Final Clinical Impression(s) / ED Diagnoses Final diagnoses:  Muscle spasm of back    Rx / DC Orders ED Discharge Orders         Ordered    methocarbamol (ROBAXIN)  500 MG tablet  2  times daily     10/21/19 2204           Dietrich Pates, Cordelia Poche 10/21/19 2211    Gerhard Munch, MD 10/21/19 2216

## 2019-10-21 NOTE — ED Triage Notes (Signed)
Pt BIB by family c/o mid/upper back pain since 2pm, with some abdominal pain as well. Denies any N/V/D but states she thinks it is related to her gallbladder because that's what they told her was wrong last time she had this pain.

## 2019-10-21 NOTE — Discharge Instructions (Signed)
Take the medications along with Aleve to help with your symptoms. You can apply heating pad, stretching and massaging the area will help too. Return to the ED if you start to have worsening back pain, chest pain, numbness in your arms or legs, vomiting, shortness of breath.

## 2019-10-21 NOTE — ED Notes (Signed)
Pt is very concerned about the cost of this visit and expenses of medications she may be discharged with due to insurance coverage.

## 2019-10-21 NOTE — ED Notes (Signed)
Interpreter [phone] used to speak with pt.

## 2020-01-13 ENCOUNTER — Ambulatory Visit: Payer: Medicaid Other | Admitting: Internal Medicine

## 2020-02-21 ENCOUNTER — Encounter: Payer: Self-pay | Admitting: Internal Medicine

## 2020-02-21 ENCOUNTER — Ambulatory Visit: Payer: Self-pay | Attending: Internal Medicine | Admitting: Internal Medicine

## 2020-02-21 ENCOUNTER — Other Ambulatory Visit: Payer: Self-pay

## 2020-02-21 VITALS — BP 125/86 | HR 68 | Temp 97.5°F | Resp 16 | Ht 64.0 in | Wt 205.8 lb

## 2020-02-21 DIAGNOSIS — Z7689 Persons encountering health services in other specified circumstances: Secondary | ICD-10-CM

## 2020-02-21 DIAGNOSIS — M542 Cervicalgia: Secondary | ICD-10-CM

## 2020-02-21 DIAGNOSIS — Z6835 Body mass index (BMI) 35.0-35.9, adult: Secondary | ICD-10-CM

## 2020-02-21 DIAGNOSIS — G8929 Other chronic pain: Secondary | ICD-10-CM

## 2020-02-21 DIAGNOSIS — F329 Major depressive disorder, single episode, unspecified: Secondary | ICD-10-CM

## 2020-02-21 DIAGNOSIS — T7491XA Unspecified adult maltreatment, confirmed, initial encounter: Secondary | ICD-10-CM

## 2020-02-21 DIAGNOSIS — M545 Low back pain, unspecified: Secondary | ICD-10-CM

## 2020-02-21 DIAGNOSIS — E6609 Other obesity due to excess calories: Secondary | ICD-10-CM

## 2020-02-21 DIAGNOSIS — E66812 Obesity, class 2: Secondary | ICD-10-CM | POA: Insufficient documentation

## 2020-02-21 MED ORDER — DICLOFENAC SODIUM 1 % EX GEL: 2.0000 g | Freq: Four times a day (QID) | CUTANEOUS | 3 refills | Status: DC

## 2020-02-21 NOTE — Patient Instructions (Addendum)
Obesity, Adult Obesity is having too much body fat. Being obese means that your weight is more than what is healthy for you. BMI is a number that explains how much body fat you have. If you have a BMI of 30 or more, you are obese. Obesity is often caused by eating or drinking more calories than your body uses. Changing your lifestyle can help you lose weight. Obesity can cause serious health problems, such as:  Stroke.  Coronary artery disease (CAD).  Type 2 diabetes.  Some types of cancer, including cancers of the colon, breast, uterus, and gallbladder.  Osteoarthritis.  High blood pressure (hypertension).  High cholesterol.  Sleep apnea.  Gallbladder stones.  Infertility problems. What are the causes?  Eating meals each day that are high in calories, sugar, and fat.  Being born with genes that may make you more likely to become obese.  Having a medical condition that causes obesity.  Taking certain medicines.  Sitting a lot (having a sedentary lifestyle).  Not getting enough sleep.  Drinking a lot of drinks that have sugar in them. What increases the risk?  Having a family history of obesity.  Being an [place] American woman.  Being a Hispanic man.  Living in an area with limited access to: ? Romilda Garret, recreation centers, or sidewalks. ? Healthy food choices, such as grocery stores and farmers' markets. What are the signs or symptoms? The main sign is having too much body fat. How is this treated?  Treatment for this condition often includes changing your lifestyle. Treatment may include: ? Changing your diet. This may include making a healthy meal plan. ? Exercise. This may include activity that causes your heart to beat faster (aerobic exercise) and strength training. Work with your doctor to design a program that works for you. ? Medicine to help you lose weight. This may be used if you are not able to lose 1 pound a week after 6 weeks of healthy eating and  more exercise. ? Treating conditions that cause the obesity. ? Surgery. Options may include gastric banding and gastric bypass. This may be done if:  Other treatments have not helped to improve your condition.  You have a BMI of 40 or higher.  You have life-threatening health problems related to obesity. Follow these instructions at home: Eating and drinking   Follow advice from your doctor about what to eat and drink. Your doctor may tell you to: ? Limit fast food, sweets, and processed snack foods. ? Choose low-fat options. For example, choose low-fat milk instead of whole milk. ? Eat 5 or more servings of fruits or vegetables each day. ? Eat at home more often. This gives you more control over what you eat. ? Choose healthy foods when you eat out. ? Learn to read food labels. This will help you learn how much food is in 1 serving. ? Keep low-fat snacks available. ? Avoid drinks that have a lot of sugar in them. These include soda, fruit juice, iced tea with sugar, and flavored milk.  Drink enough water to keep your pee (urine) pale yellow.  Do not go on fad diets. Physical activity  Exercise often, as told by your doctor. Most adults should get up to 150 minutes of moderate-intensity exercise every week.Ask your doctor: ? What types of exercise are safe for you. ? How often you should exercise.  Warm up and stretch before being active.  Do slow stretching after being active (cool down).  Rest between  times of being active. Lifestyle  Work with your doctor and a food expert (dietitian) to set a weight-loss goal that is best for you.  Limit your screen time.  Find ways to reward yourself that do not involve food.  Do not drink alcohol if: ? Your doctor tells you not to drink. ? You are pregnant, may be pregnant, or are planning to become pregnant.  If you drink alcohol: ? Limit how much you use to:  0-1 drink a day for women.  0-2 drinks a day for men. ? Be  aware of how much alcohol is in your drink. In the [place] one drink equals one 12 oz bottle of beer (355 mL), one 5 oz glass of wine (148 mL), or one 1 oz glass of hard liquor (44 mL). General instructions  Keep a weight-loss journal. This can help you keep track of: ? The food that you eat. ? How much exercise you get.  Take over-the-counter and prescription medicines only as told by your doctor.  Take vitamins and supplements only as told by your doctor.  Think about joining a support group.  Keep all follow-up visits as told by your doctor. This is important. Contact a doctor if:  You cannot meet your weight loss goal after you have changed your diet and lifestyle for 6 weeks. Get help right away if you:  Are having trouble breathing.  Are having thoughts of harming yourself. Summary  Obesity is having too much body fat.  Being obese means that your weight is more than what is healthy for you.  Work with your doctor to set a weight-loss goal.  Get regular exercise as told by your doctor. This information is not intended to replace advice given to you by your health care provider. Make sure you discuss any questions you have with your health care provider. Document Revised: 04/23/2018 Document Reviewed: 04/23/2018 Elsevier Patient Education  [place].  Chronic Back Pain When back pain lasts longer than 3 months, it is called chronic back pain. Pain may get worse at certain times (flare-ups). There are things you can do at home to manage your pain. Follow these instructions at home: Activity      Avoid bending and other activities that make pain worse.  When standing: ? Keep your upper back and neck straight. ? Keep your shoulders pulled back. ? Avoid slouching.  When sitting: ? Keep your back straight. ? Relax your shoulders. Do not round your shoulders or pull them backward.  Do not sit or stand in one place for long periods of time.  Take short  rest breaks during the day. Lying down or standing is usually better than sitting. Resting can help relieve pain.  When sitting or lying down for a long time, do some mild activity or stretching. This will help to prevent stiffness and pain.  Get regular exercise. Ask your doctor what activities are safe for you.  Do not lift anything that is heavier than 10 lb (4.5 kg). To prevent injury when you lift things: ? Bend your knees. ? Keep the weight close to your body. ? Avoid twisting. Managing pain  If told, put ice on the painful area. Your doctor may tell you to use ice for 24-48 hours after a flare-up starts. ? Put ice in a plastic bag. ? Place a towel between your skin and the bag. ? Leave the ice on for 20 minutes, 2-3 times a day.  If told, put  heat on the painful area as often as told by your doctor. Use the heat source that your doctor recommends, such as a moist heat pack or a heating pad. ? Place a towel between your skin and the heat source. ? Leave the heat on for 20-30 minutes. ? Remove the heat if your skin turns bright red. This is especially important if you are unable to feel pain, heat, or cold. You may have a greater risk of getting burned.  Soak in a warm bath. This can help relieve pain.  Take over-the-counter and prescription medicines only as told by your doctor. General instructions  Sleep on a firm mattress. Try lying on your side with your knees slightly bent. If you lie on your back, put a pillow under your knees.  Keep all follow-up visits as told by your doctor. This is important. Contact a doctor if:  You have pain that does not get better with rest or medicine. Get help right away if:  One or both of your arms or legs feel weak.  One or both of your arms or legs lose feeling (numbness).  You have trouble controlling when you poop (bowel movement) or pee (urinate).  You feel sick to your stomach (nauseous).  You throw up (vomit).  You have  belly (abdominal) pain.  You have shortness of breath.  You pass out (faint). Summary  When back pain lasts longer than 3 months, it is called chronic back pain.  Pain may get worse at certain times (flare-ups).  Use ice and heat as told by your doctor. Your doctor may tell you to use ice after flare-ups. This information is not intended to replace advice given to you by your health care provider. Make sure you discuss any questions you have with your health care provider. Document Revised: 12/10/2018 Document Reviewed: 04/03/2017 Elsevier Patient Education  2020 [employer].
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
## 2020-02-22 LAB — COMPREHENSIVE METABOLIC PANEL
ALT: 11 IU/L (ref 0–32)
AST: 13 IU/L (ref 0–40)
Albumin/Globulin Ratio: 1.4 (ref 1.2–2.2)
Albumin: 4.2 g/dL (ref 3.8–4.8)
Alkaline Phosphatase: 79 IU/L (ref 48–121)
BUN/Creatinine Ratio: 27 — ABNORMAL HIGH (ref 9–23)
BUN: 16 mg/dL (ref 6–24)
Bilirubin Total: <0.2 mg/dL (ref 0.0–1.2)
CO2: 24 mmol/L (ref 20–29)
Calcium: 9.1 mg/dL (ref 8.7–10.2)
Chloride: 101 mmol/L (ref 96–106)
Creatinine, Ser: 0.6 mg/dL (ref 0.57–1.00)
GFR calc Af Amer: 132 mL/min/{1.73_m2} (ref >59)
GFR calc non Af Amer: 114 mL/min/{1.73_m2} (ref >59)
Globulin, Total: 3.1 g/dL (ref 1.5–4.5)
Glucose: 93 mg/dL (ref 65–99)
Potassium: 4.4 mmol/L (ref 3.5–5.2)
Sodium: 137 mmol/L (ref 134–144)
Total Protein: 7.3 g/dL (ref 6.0–8.5)

## 2020-02-22 LAB — LIPID PANEL
Chol/HDL Ratio: 3.4 ratio (ref 0.0–4.4)
Cholesterol, Total: 151 mg/dL (ref 100–199)
HDL: 45 mg/dL (ref >39)
LDL Chol Calc (NIH): 86 mg/dL (ref 0–99)
Triglycerides: 107 mg/dL (ref 0–149)
VLDL Cholesterol Cal: 20 mg/dL (ref 5–40)

## 2020-02-22 LAB — CBC
Hematocrit: 39 % (ref 34.0–46.6)
Hemoglobin: 12.9 g/dL (ref 11.1–15.9)
MCH: 28.4 pg (ref 26.6–33.0)
MCHC: 33.1 g/dL (ref 31.5–35.7)
MCV: 86 fL (ref 79–97)
Platelets: 232 10*3/uL (ref 150–450)
RBC: 4.55 x10E6/uL (ref 3.77–5.28)
RDW: 13.2 % (ref 11.7–15.4)
WBC: 8.4 10*3/uL (ref 3.4–10.8)

## 2020-02-22 LAB — HEMOGLOBIN A1C
Est. average glucose Bld gHb Est-mCnc: 128 mg/dL
Hgb A1c MFr Bld: 6.1 % — ABNORMAL HIGH (ref 4.8–5.6)

## 2020-02-22 NOTE — Progress Notes (Signed)
Blood count is normal meaning no anemia. Cholesterol levels normal. Kidney and liver function tests normal. She screen positive for prediabetes.  Healthy eating habits and regular exercise as discussed will help prevent progression to diabetes.

## 2020-02-27 ENCOUNTER — Emergency Department (HOSPITAL_COMMUNITY)
Admission: EM | Admit: 2020-02-27 | Discharge: 2020-02-27 | Disposition: A | Discharge status: Home / Self Care | Payer: Self-pay | Attending: Emergency Medicine | Admitting: Emergency Medicine

## 2020-02-27 ENCOUNTER — Encounter (HOSPITAL_COMMUNITY): Payer: Self-pay | Admitting: Emergency Medicine

## 2020-02-27 ENCOUNTER — Other Ambulatory Visit: Payer: Self-pay

## 2020-02-27 ENCOUNTER — Emergency Department (HOSPITAL_COMMUNITY): Payer: Self-pay

## 2020-02-27 DIAGNOSIS — R0789 Other chest pain: Secondary | ICD-10-CM | POA: Insufficient documentation

## 2020-02-27 DIAGNOSIS — Z880 Allergy status to penicillin: Secondary | ICD-10-CM | POA: Insufficient documentation

## 2020-02-27 LAB — CBC WITH DIFFERENTIAL/PLATELET
Abs Immature Granulocytes: 0.02 10*3/uL (ref 0.00–0.07)
Basophils Absolute: 0 10*3/uL (ref 0.0–0.1)
Basophils Relative: 1 %
Eosinophils Absolute: 0.2 10*3/uL (ref 0.0–0.5)
Eosinophils Relative: 2 %
HCT: 38.2 % (ref 36.0–46.0)
Hemoglobin: 12.3 g/dL (ref 12.0–15.0)
Immature Granulocytes: 0 %
Lymphocytes Relative: 20 %
Lymphs Abs: 1.7 10*3/uL (ref 0.7–4.0)
MCH: 28.3 pg (ref 26.0–34.0)
MCHC: 32.2 g/dL (ref 30.0–36.0)
MCV: 87.8 fL (ref 80.0–100.0)
Monocytes Absolute: 0.8 10*3/uL (ref 0.1–1.0)
Monocytes Relative: 9 %
Neutro Abs: 5.8 10*3/uL (ref 1.7–7.7)
Neutrophils Relative %: 68 %
Platelets: 234 10*3/uL (ref 150–400)
RBC: 4.35 MIL/uL (ref 3.87–5.11)
RDW: 13.3 % (ref 11.5–15.5)
WBC: 8.5 10*3/uL (ref 4.0–10.5)
nRBC: 0 % (ref 0.0–0.2)

## 2020-02-27 LAB — COMPREHENSIVE METABOLIC PANEL
ALT: 15 U/L (ref 0–44)
AST: 14 U/L — ABNORMAL LOW (ref 15–41)
Albumin: 3.8 g/dL (ref 3.5–5.0)
Alkaline Phosphatase: 53 U/L (ref 38–126)
Anion gap: 8 (ref 5–15)
BUN: 19 mg/dL (ref 6–20)
CO2: 26 mmol/L (ref 22–32)
Calcium: 8.5 mg/dL — ABNORMAL LOW (ref 8.9–10.3)
Chloride: 106 mmol/L (ref 98–111)
Creatinine, Ser: 0.59 mg/dL (ref 0.44–1.00)
GFR calc Af Amer: >60 mL/min (ref >60)
GFR calc non Af Amer: >60 mL/min (ref >60)
Glucose, Bld: 110 mg/dL — ABNORMAL HIGH (ref 70–99)
Potassium: 4.1 mmol/L (ref 3.5–5.1)
Sodium: 140 mmol/L (ref 135–145)
Total Bilirubin: 0.5 mg/dL (ref 0.3–1.2)
Total Protein: 7.3 g/dL (ref 6.5–8.1)

## 2020-02-27 LAB — TROPONIN I (HIGH SENSITIVITY): Troponin I (High Sensitivity): <2 ng/L (ref <18)

## 2020-02-27 NOTE — Discharge Instructions (Addendum)
You have been seen for right upper chest wall pain.  I want you to follow-up with your primary care doctor in 1 to 2 weeks for reevaluation especially if pain persists  I want to come back to the emergency department if you develop shortness of breath, chest pain, uncontrolled nausea, vomiting, diarrhea, severe abdominal pain as these symptoms require further evaluation and management.

## 2020-02-27 NOTE — ED Triage Notes (Signed)
Per pt, states she is having right upper chest/collar bone pain-thinks she slept on it wrong-refused EKG-just wants an xray

## 2020-02-27 NOTE — ED Provider Notes (Signed)
[facility]-EMERGENCY DEPT Provider Note   CSN: [id number] Arrival date & time: 02/27/20  1144     History Chief Complaint  Patient presents with  . collar bone pain    Denise Arias is a 39 y.o. female.  HPI   Patient presents to the emergency department with chief complaint of right upper chest pain that started last night around 11 PM.  Patient states pain lasted for 3 hours and then went away on its own.  Patient described the pain as a constant sharp pain that radiated from her sternum to her right clavicle.  Patient denies any recent trauma to the area, denies shortness of breath, heart palpitations, becoming diaphoretic, nausea, vomiting.  Patient states there were no alleviating or aggravating factors.  She is currently not in any pain now but wanted to come in to be checked out.  Patient has no significant medical history, does not take medication daily basis, denies IV drug use, does not drink alcohol.  Patient denies fever, chills, cough, congestion, sore throat, shortness of breath, abdominal pain, nausea, vomiting, pedal edema.  Past Medical History:  Diagnosis Date  . Allergy    RHINITIS  . GERD (gastroesophageal reflux disease)     Patient Active Problem List   Diagnosis Date Noted  . Chronic midline low back pain without sciatica 02/21/2020  . 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 Class 2 obesity due to excess calories without serious comorbidity with body mass index (BMI) of 35.0 to 35.9 in adult 02/21/2020  . Allergic reaction 03/03/2017  . Skin sensitivity 03/03/2017  . Medication reaction, subsequent encounter 03/03/2017  . Frequent headaches 11/15/2016  . Palpitations 11/15/2016  . Neck pain 11/15/2016  . Family history of thyroid disease in sister 11/15/2016  . Sleep disturbance 11/15/2016  . Fatigue 11/15/2016  . Heat intolerance 11/15/2016  . 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 GERD (gastroesophageal reflux disease) 05/27/2013     Past Surgical History:  Procedure Laterality Date  . NO PAST SURGERIES    . VAGINAL DELIVERY     X 4     OB History    Gravida  5   Para  5   Term  5   Preterm      AB      Living  5     SAB      TAB      Ectopic      Multiple  0   Live Births  1           Family History  Problem Relation Age of Onset  . Diabetes Mother   . Hypertension Mother   . Thyroid disease Sister        goiter  . Diabetes Sister   . Other Brother        murdered    Social History   Tobacco Use  . Smoking status: Never Smoker  . Smokeless tobacco: Never Used  Vaping Use  . Vaping Use: Never used  Substance Use Topics  . Alcohol use: No  . Drug use: No    Home Medications Prior to Admission medications   Medication Sig Start Date End Date Taking? Authorizing Provider  acetaminophen (TYLENOL) 650 MG CR tablet Take 650 mg by mouth every 8 (eight) hours as needed for pain. Patient not taking: Reported on 02/21/2020    [provider]  diclofenac Sodium (VOLTAREN) 1 % GEL Apply 2 g topically 4 (four) times daily. 02/21/20   Laural Benes,  Dalbert Batman, MD  methocarbamol (ROBAXIN) 500 MG tablet Take 1 tablet (500 mg total) by mouth 2 (two) times daily. Patient not taking: Reported on 02/21/2020 10/21/19   Delia Heady, PA-C  metroNIDAZOLE (FLAGYL) 500 MG tablet Take 1 tablet (500 mg total) by mouth 2 (two) times daily. Patient not taking: Reported on 02/21/2020 08/31/17   Julianne Handler, CNM    Allergies    Amoxicillin, Multivitamins, Banana, Fish oil, and Strawberry extract  Review of Systems   Review of Systems  Constitutional: Negative for chills and fever.  HENT: Negative for congestion, sore throat and tinnitus.   Eyes: Negative for pain.  Respiratory: Negative for cough and shortness of breath.   Cardiovascular: Positive for chest pain. Negative for leg swelling.       Right upper chest pain.  Gastrointestinal: Negative for abdominal pain, diarrhea, nausea and  vomiting.  Genitourinary: Negative for dysuria, enuresis and flank pain.  Musculoskeletal: Negative for back pain.  Skin: Negative for rash.  Neurological: Negative for dizziness, light-headedness, numbness and headaches.  Hematological: Does not bruise/bleed easily.    Physical Exam Updated Vital Signs BP 120/88 (BP Location: Right Arm)   Pulse 79   Temp 98 F (36.7 C) (Oral)   Resp 19   LMP 02/26/2020   SpO2 100%   Physical Exam Vitals and nursing note reviewed.  Constitutional:      General: She is not in acute distress.    Appearance: She is not ill-appearing.  HENT:     Head: Normocephalic and atraumatic.     Nose: No congestion.     Mouth/Throat:     Mouth: Mucous membranes are moist.     Pharynx: Oropharynx is clear.  Eyes:     General: No scleral icterus. Cardiovascular:     Rate and Rhythm: Normal rate and regular rhythm.     Pulses: Normal pulses.     Heart sounds: No murmur heard.  No friction rub. No gallop.   Pulmonary:     Effort: No respiratory distress.     Breath sounds: No wheezing, rhonchi or rales.  Abdominal:     General: There is no distension.     Palpations: Abdomen is soft.     Tenderness: There is no abdominal tenderness. There is no guarding.  Musculoskeletal:        General: No swelling.     Comments: Patient's chest was visualized, there is no erythema, swelling, rash, abrasions, lacerations or other gross abnormalities noted on the chest or right shoulder and clavicle.  Patient had no tenderness along her clavicle or sternum.  Patient full range of motion of her right arm, 5/5 strength, good radial pulse, good capillary refill.  Skin:    General: Skin is warm and dry.     Findings: No rash.  Neurological:     Mental Status: She is alert.  Psychiatric:        Mood and Affect: Mood normal.     ED Results / Procedures / Treatments   Labs (all labs ordered are listed, but only abnormal results are displayed) Labs Reviewed    COMPREHENSIVE METABOLIC PANEL  CBC WITH DIFFERENTIAL/PLATELET  TROPONIN I (HIGH SENSITIVITY)  TROPONIN I (HIGH SENSITIVITY)    EKG None  Radiology DG Chest 2 View  Result Date: 02/27/2020 CLINICAL DATA:  Chest pain EXAM: CHEST - 2 VIEW COMPARISON:  10/21/2019 FINDINGS: The heart size and mediastinal contours are within normal limits. Both lungs are clear. No pleural effusion or pneumothorax.  The visualized skeletal structures are unremarkable. IMPRESSION: No acute process the chest Electronically Signed   By: Guadlupe Spanish M.D.   On: 02/27/2020 13:52    Procedures Procedures (including critical care time)  Medications Ordered in ED Medications - No data to display  ED Course  I have reviewed the triage vital signs and the nursing notes.  Pertinent labs & imaging results that were available during my care of the patient were reviewed by me and considered in my medical decision making (see chart for details).    MDM Rules/Calculators/A&P                          Due to patient's complaint most concern for MI versus pneumonia versus fracture.  Unlikely patient suffering from pneumonia as chest x-ray came back unremarkable, lung sounds  clear bilaterally, patient's vitals are reassuring, patient appears nontoxic.  EKG shows sinus rhythm without signs of ischemia.  Patient informed nursing staff that she did not want to wait longer for the rest of her labs to result (troponin, CBC, CMP). spoke with patient about staying for labs to resulted but patient would like to go home as she has kids and is currently not having any chest pain.  Patient appears to be resting comfortably in bed, showing no signs of acute distress.  Vital signs remained stable does not meet criteria to be admitted to the hospital.  Differential diagnosis acid reflux versus costochondritis versus muscle strain.  Recommend patient follow-up primary care doctor for further evaluation and management.  Patient was  discussed with attending who agrees assessment and plan.  Patient was given at home care as well as strict return precautions.  Patient stated they understand and agrees with the plan. Final Clinical Impression(s) / ED Diagnoses Final diagnoses:  Right-sided chest wall pain    Rx / DC Orders ED Discharge Orders    None       Carroll Sage, PA-C 02/27/20 1543    Milagros Loll, MD 02/28/20 2128

## 2020-04-11 ENCOUNTER — Other Ambulatory Visit: Payer: Self-pay

## 2020-04-11 ENCOUNTER — Ambulatory Visit: Payer: Medicaid Other | Attending: Internal Medicine | Admitting: Internal Medicine

## 2020-04-11 ENCOUNTER — Encounter: Payer: Self-pay | Admitting: Internal Medicine

## 2020-04-11 VITALS — BP 112/76 | HR 64 | Resp 16 | Wt 203.6 lb

## 2020-04-11 DIAGNOSIS — R7303 Prediabetes: Secondary | ICD-10-CM | POA: Insufficient documentation

## 2020-04-11 DIAGNOSIS — Z9109 Other allergy status, other than to drugs and biological substances: Secondary | ICD-10-CM

## 2020-04-11 DIAGNOSIS — M545 Low back pain: Secondary | ICD-10-CM

## 2020-04-11 DIAGNOSIS — G8929 Other chronic pain: Secondary | ICD-10-CM

## 2020-04-11 DIAGNOSIS — Z124 Encounter for screening for malignant neoplasm of cervix: Secondary | ICD-10-CM

## 2020-04-11 NOTE — Patient Instructions (Signed)
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
## 2020-04-12 LAB — CYTOLOGY - PAP
Comment: NEGATIVE
Diagnosis: NEGATIVE
High risk HPV: NEGATIVE

## 2020-04-14 ENCOUNTER — Telehealth: Payer: Self-pay

## 2020-04-14 NOTE — Telephone Encounter (Signed)
Contacted pt to go over pap results pt is aware and doesn't have any questions or concerns  

## 2020-04-28 ENCOUNTER — Other Ambulatory Visit: Payer: Self-pay

## 2020-04-28 ENCOUNTER — Ambulatory Visit (HOSPITAL_COMMUNITY)
Admission: RE | Admit: 2020-04-28 | Discharge: 2020-04-28 | Disposition: A | Discharge status: Home / Self Care | Payer: Medicaid Other | Source: Ambulatory Visit | Attending: Internal Medicine | Admitting: Internal Medicine

## 2020-04-28 DIAGNOSIS — M545 Low back pain: Secondary | ICD-10-CM | POA: Insufficient documentation

## 2020-04-28 DIAGNOSIS — G8929 Other chronic pain: Secondary | ICD-10-CM

## 2020-05-01 ENCOUNTER — Telehealth: Payer: Self-pay | Admitting: Internal Medicine

## 2020-05-01 ENCOUNTER — Telehealth: Payer: Self-pay

## 2020-05-01 NOTE — Telephone Encounter (Signed)
Contacted pt to go over lab results pt didn't answer lvm asking pt to give a call back at their earliest convenience  

## 2020-05-01 NOTE — Telephone Encounter (Signed)
Patient was verified by 2 identifier by husband. Result was given verbalized understanding and had no further questions at this time.

## 2020-05-02 ENCOUNTER — Ambulatory Visit: Payer: Medicaid Other | Attending: Internal Medicine | Admitting: Physical Therapy

## 2020-05-02 ENCOUNTER — Other Ambulatory Visit: Payer: Self-pay

## 2020-05-02 ENCOUNTER — Encounter: Payer: Self-pay | Admitting: Physical Therapy

## 2020-05-02 DIAGNOSIS — M5440 Lumbago with sciatica, unspecified side: Secondary | ICD-10-CM | POA: Insufficient documentation

## 2020-05-02 DIAGNOSIS — M6281 Muscle weakness (generalized): Secondary | ICD-10-CM | POA: Insufficient documentation

## 2020-05-02 DIAGNOSIS — R293 Abnormal posture: Secondary | ICD-10-CM

## 2020-05-02 NOTE — Therapy (Signed)
[facility] [address] [place], [place], [address] Phone: [phone]   Fax:  [phone]  Physical Therapy Evaluation  Patient Details  Name: Denise Arias MRN: [id number] Date of Birth: 1981-02-07 Referring Provider (PT): Dr. Jonah Blue    Encounter Date: 05/02/2020   PT End of Session - 05/02/20 1729    Visit Number 1    Number of Visits 12    Date for PT Re-Evaluation 06/16/20    Authorization Type [employer]    PT Start Time 1535    PT Stop Time 1613    PT Time Calculation (min) 38 min    Activity Tolerance Patient tolerated treatment well    Behavior During Therapy [facility] for tasks assessed/performed           Past Medical History:  Diagnosis Date  . Allergy    RHINITIS  . GERD (gastroesophageal reflux disease)     Past Surgical History:  Procedure Laterality Date  . NO PAST SURGERIES    . VAGINAL DELIVERY     X 4    There were no vitals filed for this visit.    Subjective Assessment - 05/02/20 1540    Subjective Pt presents  with chronic pain for >2 yrs.  with increased pain about 3 weeks ago.  She has pain in back bilateral and into Rt hip, leg.  Reports fatigue in legs and diffculty walking.    Limitations Sitting;Lifting;Standing;Walking;House hold activities    How long can you stand comfortably? stiffness when standing    Diagnostic tests XR showed facet arthropathy    Patient Stated Goals Pt would like to be able to walk normally    Currently in Pain? Yes    Pain Score 5     Pain Location Back    Pain Orientation Right;Left    Pain Descriptors / Indicators Tightness;Aching    Pain Type Chronic pain    Pain Radiating Towards legs R>L    Pain Onset More than a month ago    Pain Frequency Intermittent    Aggravating Factors  overactivity, standing and walking    Pain Relieving Factors rest , OTC meds    Effect of Pain on Daily Activities limits her ability to stand, walk, perform  ADLs    Multiple Pain Sites No              [facility] PT Assessment - 05/02/20 0001      Assessment   Medical Diagnosis low back pain     Referring Provider (PT) Dr. Jonah Blue     Onset Date/Surgical Date --   chronic    Prior Therapy No       Precautions   Precautions None      Restrictions   Weight Bearing Restrictions No      Balance Screen   Has the patient fallen in the past 6 months No    Has the patient had a decrease in activity level because of a fear of falling?  No    Is the patient reluctant to leave their home because of a fear of falling?  No      Home Nurse, mental health Private residence    Living Arrangements Spouse/significant other;Children    Type of Home House    Home Access Stairs to enter    Entrance Stairs-Number of Steps 6    Home Layout One level      Prior Function   Level of Independence  Independent    Vocation Buyer, retail    Leisure playing outside with kids , has 5 kids       Cognition   Overall Cognitive Status Within Functional Limits for tasks assessed      Observation/Other Assessments   Focus on Therapeutic Outcomes (FOTO)  50%      Sensation   Light Touch Appears Intact      Functional Tests   Functional tests Squat      Squat   Comments poor body mechanics for lifting, cues for proper squat       Posture/Postural Control   Posture/Postural Control Postural limitations    Postural Limitations Rounded Shoulders;Forward head;Increased lumbar lordosis;Anterior pelvic tilt      AROM   Lumbar Flexion 25% pain     Lumbar Extension 50% pain     Lumbar - Right Side Bend decr with pain R    Lumbar - Left Side Bend decr with pain R     Lumbar - Right Rotation WFL with pain     Lumbar - Left Rotation WFL with pain       PROM   Overall PROM Comments limited hip flexion to 95 deg, tight ER and IR bilateral       Strength   Right/Left Hip --   pain inhibits   Right Hip  Flexion 3+/5    Right Hip Extension 3+/5    Right Hip ABduction 3+/5    Left Hip Flexion 3+/5    Left Hip Extension 3+/5    Left Hip ABduction 3+/5    Right Knee Flexion 5/5    Right Knee Extension 5/5    Left Knee Flexion 5/5    Left Knee Extension 5/5      Palpation   Spinal mobility painful in low lumbar, stiff with P/A     Palpation comment tight paraspinals bilateral in lumbar and into superior glutes       Special Tests   Other special tests neg SLR, pain with prone knee flexion bilateral tight hip flexors             Objective measurements completed on examination: See above findings.      PT Education - 05/02/20 1723    Person(s) Educated Patient    Methods Explanation;Demonstration;Handout    Comprehension Verbalized understanding;Need further instruction               PT Long Term Goals - 05/02/20 1730      PT LONG TERM GOAL #1   Title Pt will be I with HEP for trunk flexibility and hip/core strength    Baseline given on eval    Time 6    Period Weeks    Status New    Target Date 06/16/20      PT LONG TERM GOAL #2   Title Pt will be able to perform transitional movements without increasing back pain (sit to stand, sit to supine, etc)    Time 6    Period Weeks    Status New    Target Date 06/16/20      PT LONG TERM GOAL #3   Title Patient will be able to complete light housework without increasing symptoms of back pain    Baseline poor body mechanics and body awareness    Time 6    Period Weeks    Status New    Target Date 06/16/20      PT LONG TERM  GOAL #4   Title Pt will be able to lift 15lbs from the floor with proper mechanics and no increased back pain    Time 66    Period Weeks    Status New    Target Date 06/16/20      PT LONG TERM GOAL #5   Title Pt will walk without a limp and improved ease for community distances    Time 6    Period Weeks    Status New    Target Date 06/16/20                  Plan - 05/02/20  1723    Clinical Impression Statement Patient presents for mod complexity eval of bilateral low back pain with increasing intensity and radiation into LEs R>L.  She complains of fatigue, inability to walk and stand long distances.  She has limited capacity to clean, do light housework due to pain.  She demonstrated poor mechnics in the clinic. She shows decreased abdominal support , hyperlordosis. Initiated HEP with some degree of difficulty in supine.   She should improve when her movement patterns are normalized and with some trunk flexibility .    Personal Factors and Comorbidities Past/Current Experience;Social Background;Time since onset of injury/illness/exacerbation    Examination-Activity Limitations Stairs;Stand;Bed Mobility;Bend;Sit;Transfers;Sleep;Lift;Caring for Others;Carry;Squat;Locomotion Level    Examination-Participation Restrictions Interpersonal Relationship;Cleaning;Community Activity;Laundry;Meal Prep;Shop    Stability/Clinical Decision Making Evolving/Moderate complexity    Clinical Decision Making Moderate    Rehab Potential Good    PT Frequency 2x / week    PT Duration 6 weeks    PT Treatment/Interventions ADLs/Self Care Home Management;Electrical Stimulation;Cryotherapy;Moist Heat;Functional mobility training;Therapeutic exercise;Neuromuscular re-education;Patient/family education;Manual techniques;Dry needling    PT Next Visit Plan review HEP and begin body mechanics    PT Home Exercise Plan [id number]: pt ed/lifting, pelvic tilt, cat/cow and childs pose    Consulted and Agree with Plan of Care Patient           Patient will benefit from skilled therapeutic intervention in order to improve the following deficits and impairments:  Decreased mobility, Impaired flexibility, Postural dysfunction, Obesity, Pain, Increased fascial restricitons, Hypomobility, Decreased skin integrity, Decreased range of motion, Decreased activity tolerance, Abnormal gait  Visit Diagnosis: Acute  bilateral low back pain with sciatica, sciatica laterality unspecified  Abnormal posture  Muscle weakness (generalized)     Problem List Patient Active Problem List   Diagnosis Date Noted  . Prediabetes 04/11/2020  . Chronic midline low back pain without sciatica 02/21/2020  . 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 Class 2 obesity due to excess calories without serious comorbidity with body mass index (BMI) of 35.0 to 35.9 in adult 02/21/2020  . Allergic reaction 03/03/2017  . Skin sensitivity 03/03/2017  . Medication reaction, subsequent encounter 03/03/2017  . Frequent headaches 11/15/2016  . Palpitations 11/15/2016  . Neck pain 11/15/2016  . Family history of thyroid disease in sister 11/15/2016  . Sleep disturbance 11/15/2016  . Fatigue 11/15/2016  . Heat intolerance 11/15/2016  . 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 GERD (gastroesophageal reflux disease) 05/27/2013    Timmy Cleverly 05/02/2020, 5:46 PM  [facility] [address] [place], [place], [address] Phone: [phone]   Fax:  [phone]  Name: Julieanna Geraci MRN: [id number] Date of Birth: Dec 02, 1980  Karie Mainland, PT 05/02/20 5:46 PM Phone: [phone] Fax: [phone]

## 2020-05-03 NOTE — Progress Notes (Signed)
New Patient Note  RE: Denise Arias MRN: [id number] DOB: 06-16-1981 Date of Office Visit: 05/04/2020  Referring provider: Marcine Matar, MD Primary care provider: Marcine Matar, MD  Chief Complaint: Medication Reaction  History of Present Illness: I had the pleasure of seeing Denise Arias for initial evaluation at the [facility] on 05/05/2020. She is a 39 y.o. female, who is referred here by Marcine Matar, MD for the evaluation of allergic reaction.  Drug allergies: Minocycline caused hives in 2018 after she took 3 days worth of it. She was taking it for some rash on her skin which was prescribed by her dermatologist. Symptoms resolved after a few days of receiving a steroid injection and stopping the medication.   Patient broke out in hives after taking some kind of cough medicine. This was in [place] and she is not sure what the medication was called.  Due to these drug allergies she is concerned about getting the COVID-19 vaccination.   Any known reactions to polyethylene glycol or polysorbate?  Not that patient is aware of.  Any history of anaphylaxis to vaccinations? Flu shot caused fevers and distortion of taste in the past.  Any history of reactions to injectable medications? No.   Any history of anaphylaxis to colonoscopy preps (i.e.Miralax)? No prior exposure.   Any history of dermal filler treatments in the last year? No.  Assessment and Plan: Denise Arias is a 39 y.o. female with: Drug reaction Patient developed hives after taking minocycline and some type of cough medicine from [place]. She is concerned about getting the COVID-19 vaccine due to this.  Discussed with patient that I don't have a test that checks for what drugs a patient is allergic to - this type of testing does not exist.   Continue to avoid minocycline.  Vaccine counseling Discussed the risks and benefits of COVID-19 vaccination.    Patient has no contraindications in her medical history.   Recommend getting the COVID-19 vaccine.   Discussed that our office has been giving injections to our patiens who are concerned about possible allergic reactions afterwards and she can schedule to receive her injection at our office if interested. However, this is not necessary and she does not need component testing either.   Other allergic rhinitis Mild rhino conjunctivitis symptoms with unknown triggers. Takes OTC antihistamines with good benefit. No prior allergy testing.  Today's skin testing showed: Positive to trees, mold and dust mites.  Start environmental control measures as below.  May use over the counter antihistamines such as Zyrtec (cetirizine), Claritin (loratadine), Allegra (fexofenadine), or Xyzal (levocetirizine) daily as needed.  May use olopatadine eye drops 0.2% once a day as needed for itchy/watery eyes.  Allergic conjunctivitis of both eyes  See assessment and plan as above for allergic rhinitis.   Adverse food reaction Beans and eggs cause increased gas and diarrhea. Fish and strawberries cause pruritus. Interested in testing for foods as well.  Today's skin testing showed: Negative to foods.   Continue to avoid foods that bother you - fish, strawberries, beans, eggs.   For mild symptoms you can take over the counter antihistamines such as Benadryl and monitor symptoms closely. If symptoms worsen or if you have severe symptoms including breathing issues, throat closure, significant swelling, whole body hives, severe diarrhea and vomiting, lightheadedness then seek immediate medical care.  Return in about 6 months (around 11/01/2020).  Meds ordered this encounter  Medications  . Olopatadine  HCl 0.2 % SOLN    Sig: Apply 1 drop to eye daily as needed (itchy/watery eyes).    Dispense:  2.5 mL    Refill:  5   Other allergy screening: Asthma: no Rhino conjunctivitis: yes  Mild rhino  conjunctivitis symptoms at times but not sure of triggers.  Takes otc antihistamines with some benefit. No prior allergy testing done.  Food allergy: yes  Beans causes diarrhea Can eat 1/2 of a banana with no issues but she eats more then develops problems.  Fish sometimes causes pruritus. Strawberries cause pruritus in her tongue.  Eggs cause diarrhea and increased gas.   Dietary History: patient has been eating other foods including milk, peanut, treenuts, sesame, shellfish, soy, wheat, meats, fruits and vegetables.  Hymenoptera allergy: no History of recurrent infections suggestive of immunodeficency: no  Diagnostics: Skin Testing: Environmental allergy panel and select foods. Positive to trees, mold and dust mites. Negative to foods.  Results discussed with patient/family.  Airborne Adult Perc - 05/04/20 1420    Time Antigen Placed 1420    Allergen Manufacturer Waynette Buttery    Location Back    Number of Test 59    Panel 1 Select    1. Control-Buffer 50% Glycerol Negative    2. Control-Histamine 1 mg/ml 2+    3. Albumin saline Negative    4. Bahia Negative    5. French Southern Territories Negative    6. Johnson Negative    7. [place] Blue Negative    8. Meadow Fescue Negative    9. Perennial Rye Negative    10. Sweet Vernal Negative    11. Timothy Negative    12. Cocklebur Negative    13. Burweed Marshelder Negative    14. Ragweed, short Negative    15. Ragweed, Giant Negative    16. Plantain,  English Negative    17. Lamb's Quarters Negative    18. Sheep Sorrell Negative    19. Rough Pigweed Negative    20. Marsh Elder, Rough Negative    21. Mugwort, Common Negative    22. Ash mix Negative    23. Birch mix Negative    24. Beech American Negative    25. Box, Elder Negative    26. Cedar, red Negative    27. Cottonwood, Guinea-Bissau Negative    28. Elm mix Negative    29. Hickory 2+    30. Maple mix Negative    31. Oak, Guinea-Bissau mix Negative    32. Pecan Pollen 2+    33. Pine mix  Negative    34. Sycamore Eastern Negative    35. Walnut, Black Pollen Negative    36. Alternaria alternata Negative    37. Cladosporium Herbarum Negative    38. Aspergillus mix Negative    39. Penicillium mix Negative    40. Bipolaris sorokiniana (Helminthosporium) Negative    41. Drechslera spicifera (Curvularia) Negative    42. Mucor plumbeus Negative    43. Fusarium moniliforme Negative    44. Aureobasidium pullulans (pullulara) Negative    45. Rhizopus oryzae Negative    46. Botrytis cinera Negative    47. Epicoccum nigrum Negative    48. Phoma betae Negative    49. Candida Albicans Negative    50. Trichophyton mentagrophytes Negative    51. Mite, D Farinae  5,000 AU/ml 4+    52. Mite, D Pteronyssinus  5,000 AU/ml 4+    53. Cat Hair 10,000 BAU/ml Negative    54.  Dog Epithelia Negative  55. Mixed Feathers Negative    56. Horse Epithelia Negative    57. Cockroach, German Negative    58. Mouse Negative    59. Tobacco Leaf Negative          Food Adult Perc - 05/04/20 1400    Time Antigen Placed 1420    Allergen Manufacturer Waynette ButteryGreer    Location Back    Number of allergen test 72    Panel 2 Select    Control-Histamine 1 mg/ml 2+    1. Peanut Negative    2. Soybean Negative    3. Wheat Negative    4. Sesame Negative    5. Milk, cow Negative    6. Egg White, Chicken Negative    7. Casein Negative    8. Shellfish Mix Negative    9. Fish Mix Negative    10. Cashew Negative    11. Pecan Food Negative    12. Walnut Food Negative    13. Almond Negative    14. Hazelnut Negative    15. [place] nut Negative    16. Coconut Negative    17. Pistachio Negative    18. Catfish Negative    19. Bass Negative    20. Trout Negative    21. Tuna Negative    22. Salmon Negative    23. Flounder Negative    24. Codfish Negative    25. Shrimp Negative    26. Crab Negative    27. Lobster Negative    28. Oyster Negative    29. Scallops Negative    30. Barley Negative    31. Oat   Negative    32. Rye  Negative    33. Hops Negative    34. Rice Negative    35. Cottonseed Negative    36. Saccharomyces Cerevisiae  Negative    37. Pork Negative    38. Malawiurkey Meat Negative    39. Chicken Meat Negative    40. Beef Negative    41. Lamb Negative    42. Tomato Negative    43. White Potato Negative    44. Sweet Potato Negative    45. Pea, Green/English Negative    46. Navy Bean Negative    47. Mushrooms Negative    48. Avocado Negative    49. Onion Negative    50. Cabbage Negative    51. Carrots Negative    52. Celery Negative    53. Corn Negative    54. Cucumber Negative    55. Grape (White seedless) Negative    56. Orange  Negative    57. Banana Negative    58. Apple Negative    59. Peach Negative    60. Strawberry Negative    61. Cantaloupe Negative    62. Watermelon Negative    63. Pineapple Negative    64. Chocolate/Cacao bean Negative    65. Karaya Gum Negative    66. Acacia (Arabic Gum) Negative    67. Cinnamon Negative    68. Nutmeg Negative    69. Ginger Negative    70. Garlic Negative    71. Pepper, black Negative    72. Mustard Negative           Past Medical History: Patient Active Problem List   Diagnosis Date Noted  . Adverse food reaction 05/05/2020  . Other allergic rhinitis 05/05/2020  . Vaccine counseling 05/05/2020  . Allergic conjunctivitis of both eyes 05/05/2020  . Prediabetes 04/11/2020  . Chronic midline low  back pain without sciatica 02/21/2020  . 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 Class 2 obesity due to excess calories without serious comorbidity with body mass index (BMI) of 35.0 to 35.9 in adult 02/21/2020  . Allergic reaction 03/03/2017  . Skin sensitivity 03/03/2017  . Drug reaction 03/03/2017  . Frequent headaches 11/15/2016  . Palpitations 11/15/2016  . Neck pain 11/15/2016  . Family history of thyroid disease in sister 11/15/2016  . Sleep disturbance 11/15/2016  . Fatigue 11/15/2016  . Heat intolerance  11/15/2016  . 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 GERD (gastroesophageal reflux disease) 05/27/2013   Past Medical History:  Diagnosis Date  . Allergy    RHINITIS  . GERD (gastroesophageal reflux disease)    Past Surgical History: Past Surgical History:  Procedure Laterality Date  . NO PAST SURGERIES    . VAGINAL DELIVERY     X 4   Medication List:  Current Outpatient Medications  Medication Sig Dispense Refill  . acetaminophen (TYLENOL) 650 MG CR tablet Take 650 mg by mouth every 8 (eight) hours as needed for pain.  (Patient not taking: Reported on 05/02/2020)    . diclofenac Sodium (VOLTAREN) 1 % GEL Apply 2 g topically 4 (four) times daily. (Patient not taking: Reported on 05/02/2020) 100 g 3  . methocarbamol (ROBAXIN) 500 MG tablet Take 1 tablet (500 mg total) by mouth 2 (two) times daily. (Patient not taking: Reported on 05/02/2020) 20 tablet 0  . metroNIDAZOLE (FLAGYL) 500 MG tablet Take 1 tablet (500 mg total) by mouth 2 (two) times daily. (Patient not taking: Reported on 05/02/2020) 14 tablet 0  . Olopatadine HCl 0.2 % SOLN Apply 1 drop to eye daily as needed (itchy/watery eyes). 2.5 mL 5   No current facility-administered medications for this visit.   Allergies: Allergies  Allergen Reactions  . Amoxicillin Nausea And Vomiting  . Minocycline     Rash per patient report.  . Multivitamins Swelling    sd her stomach/intestinal area swelled up after 2 hrs.  . Other   . Banana Hives  . Fish Oil Rash  . Strawberry Extract Rash   Social History: Social History   Socioeconomic History  . Marital status: Married    Spouse name: Not on file  . Number of children: 5  . Years of education: Not on file  . Highest education level: Some college, no degree  Occupational History  . Not on file  Tobacco Use  . Smoking status: Never Smoker  . Smokeless tobacco: Never Used  Vaping Use  . Vaping Use: Never used  Substance and Sexual Activity  . Alcohol use: No  . Drug use: No   . Sexual activity: Not on file    Comment: last week  Other Topics Concern  . Not on file  Social History Narrative  . Not on file   Social Determinants of Health   Financial Resource Strain:   . Difficulty of Paying Living Expenses: Not on file  Food Insecurity:   . Worried About Programme researcher, broadcasting/film/video in the Last Year: Not on file  . Ran Out of Food in the Last Year: Not on file  Transportation Needs:   . Lack of Transportation (Medical): Not on file  . Lack of Transportation (Non-Medical): Not on file  Physical Activity:   . Days of Exercise per Week: Not on file  . Minutes of Exercise per Session: Not on file  Stress:   . Feeling of Stress : Not on file  Social Connections:   . Frequency of Communication with Friends and Family: Not on file  . Frequency of Social Gatherings with Friends and Family: Not on file  . Attends Religious Services: Not on file  . Active Member of Clubs or Organizations: Not on file  . Attends Banker Meetings: Not on file  . Marital Status: Not on file   Lives in a 76+ year old home. Smoking: denies Occupation: not employed  Environmental HistorySurveyor, minerals in the house: yes Carpet in the family room: no Carpet in the bedroom: no Heating: gas Cooling: central Pet: no  Family History: Family History  Problem Relation Age of Onset  . Diabetes Mother   . Hypertension Mother   . Thyroid disease Sister        goiter  . Diabetes Sister   . Other Brother        murdered  . Asthma Maternal Uncle    Review of Systems  Constitutional: Negative for appetite change, chills, fever and unexpected weight change.  HENT: Negative for congestion and rhinorrhea.   Eyes: Negative for itching.  Respiratory: Negative for cough, chest tightness, shortness of breath and wheezing.   Cardiovascular: Negative for chest pain.  Gastrointestinal: Negative for abdominal pain.  Genitourinary: Negative for difficulty urinating.  Skin:  Negative for rash.  Allergic/Immunologic: Positive for environmental allergies.  Neurological: Negative for headaches.   Objective: BP 122/74   Pulse 78   Temp 97.7 F (36.5 C) (Temporal)   Resp 18   Ht 5\' 5"  (1.651 m)   Wt 202 lb (91.6 kg)   LMP 04/22/2020 (Exact Date)   SpO2 97%   BMI 33.61 kg/m  Body mass index is 33.61 kg/m. Physical Exam Vitals and nursing note reviewed.  Constitutional:      Appearance: Normal appearance. She is well-developed.  HENT:     Head: Normocephalic and atraumatic.     Right Ear: Tympanic membrane and external ear normal.     Left Ear: Tympanic membrane and external ear normal.     Nose: Nose normal.     Mouth/Throat:     Mouth: Mucous membranes are moist.     Pharynx: Oropharynx is clear.  Eyes:     Conjunctiva/sclera: Conjunctivae normal.  Cardiovascular:     Rate and Rhythm: Normal rate and regular rhythm.     Heart sounds: Normal heart sounds. No murmur heard.  No friction rub. No gallop.   Pulmonary:     Effort: Pulmonary effort is normal.     Breath sounds: Normal breath sounds. No wheezing, rhonchi or rales.  Musculoskeletal:     Cervical back: Neck supple.  Skin:    General: Skin is warm.     Findings: No rash.  Neurological:     Mental Status: She is alert and oriented to person, place, and time.  Psychiatric:        Behavior: Behavior normal.    The plan was reviewed with the patient/family, and all questions/concerned were addressed.  It was my pleasure to see Denise Arias today and participate in her care. Please feel free to contact me with any questions or concerns.  Sincerely,  Dawna Part, DO Allergy & Immunology  [facility] office: [phone] [facility] office: [phone] [place] office: [phone]

## 2020-05-04 ENCOUNTER — Encounter: Payer: Self-pay | Admitting: Allergy

## 2020-05-04 ENCOUNTER — Ambulatory Visit (INDEPENDENT_AMBULATORY_CARE_PROVIDER_SITE_OTHER): Payer: 59 | Admitting: Allergy

## 2020-05-04 ENCOUNTER — Other Ambulatory Visit: Payer: Self-pay

## 2020-05-04 VITALS — BP 122/74 | HR 78 | Temp 97.7°F | Resp 18 | Ht 65.0 in | Wt 202.0 lb

## 2020-05-04 DIAGNOSIS — T781XXD Other adverse food reactions, not elsewhere classified, subsequent encounter: Secondary | ICD-10-CM | POA: Diagnosis not present

## 2020-05-04 DIAGNOSIS — Z7189 Other specified counseling: Secondary | ICD-10-CM | POA: Diagnosis not present

## 2020-05-04 DIAGNOSIS — Z7185 Encounter for immunization safety counseling: Secondary | ICD-10-CM

## 2020-05-04 DIAGNOSIS — J3089 Other allergic rhinitis: Secondary | ICD-10-CM | POA: Diagnosis not present

## 2020-05-04 DIAGNOSIS — T50905D Adverse effect of unspecified drugs, medicaments and biological substances, subsequent encounter: Secondary | ICD-10-CM

## 2020-05-04 DIAGNOSIS — H1013 Acute atopic conjunctivitis, bilateral: Secondary | ICD-10-CM

## 2020-05-04 MED ORDER — OLOPATADINE HCL 0.2 % OP SOLN: 1.0000 [drp] | Freq: Every day | OPHTHALMIC | 5 refills | Status: DC | PRN

## 2020-05-04 NOTE — Patient Instructions (Addendum)
Today's skin testing showed: Positive to trees, mold and dust mites. Negative to foods.  Results given.  Environmental allergies  Start environmental control measures as below.  May use over the counter antihistamines such as Zyrtec (cetirizine), Claritin (loratadine), Allegra (fexofenadine), or Xyzal (levocetirizine) daily as needed.  May use olopatadine eye drops 0.2% once a day as needed for itchy/watery eyes.  Food:  Continue to avoid foods that bother you - fish, strawberries, beans, eggs.   For mild symptoms you can take over the counter antihistamines such as Benadryl and monitor symptoms closely. If symptoms worsen or if you have severe symptoms including breathing issues, throat closure, significant swelling, whole body hives, severe diarrhea and vomiting, lightheadedness then seek immediate medical care.  Drug allergies:  Avoid drugs that you had reactions to - minocycline.  Covid-19 vaccine:  No contraindication for the vaccine based on clinical history.   Recommend getting the COVID-19 vaccine.   Here's more information:  [email or web address]  Follow up in  6 months or sooner if needed.   Control of House Dust Mite Allergen . Dust mite allergens are a common trigger of allergy and asthma symptoms. While they can be found throughout the house, these microscopic creatures thrive in warm, humid environments such as bedding, upholstered furniture and carpeting. . Because so much time is spent in the bedroom, it is essential to reduce mite levels there.  . Encase pillows, mattresses, and box springs in special allergen-proof fabric covers or airtight, zippered plastic covers.  . Bedding should be washed weekly in hot water (130 F) and dried in a hot dryer. Allergen-proof covers are available for comforters and pillows that can't be regularly washed.  Arias Denise the allergy-proof covers every few months. Minimize clutter in the bedroom. Keep pets out of the  bedroom.  Marland Kitchen Keep humidity less than 50% by using a dehumidifier or air conditioning. You can buy a humidity measuring device called a hygrometer to monitor this.  . If possible, replace carpets with hardwood, linoleum, or washable area rugs. If that's not possible, vacuum frequently with a vacuum that has a HEPA filter. . Remove all upholstered furniture and non-washable window drapes from the bedroom. . Remove all non-washable stuffed toys from the bedroom.  Wash stuffed toys weekly. Reducing Pollen Exposure . Pollen seasons: trees (spring), grass (summer) and ragweed/weeds (fall). Marland Kitchen Keep windows closed in your home and car to lower pollen exposure.  Lilian Kapur air conditioning in the bedroom and throughout the house if possible.  . Avoid going out in dry windy days - especially early morning. . Pollen counts are highest between 5 - 10 AM and on dry, hot and windy days.  . Save outside activities for late afternoon or after a heavy rain, when pollen levels are lower.  . Avoid mowing of grass if you have grass pollen allergy. Marland Kitchen Be aware that pollen can also be transported indoors on people and pets.  . Dry your clothes in an automatic dryer rather than hanging them outside where they might collect pollen.  . Rinse hair and eyes before bedtime. Mold Control . Mold and fungi can grow on a variety of surfaces provided certain temperature and moisture conditions exist.  . Outdoor molds grow on plants, decaying vegetation and soil. The major outdoor mold, Alternaria and Cladosporium, are found in very high numbers during hot and dry conditions. Generally, a late summer - fall peak is seen for common outdoor fungal spores. Rain will temporarily lower outdoor mold spore  count, but counts rise rapidly when the rainy period ends. . The most important indoor molds are Aspergillus and Penicillium. Dark, humid and poorly ventilated basements are ideal sites for mold growth. The next most common sites of mold  growth are the bathroom and the kitchen. Outdoor (Seasonal) Mold Control . Use air conditioning and keep windows closed. . Avoid exposure to decaying vegetation. Marland Kitchen Avoid leaf raking. . Avoid grain handling. . Consider wearing a face mask if working in moldy areas.  Indoor (Perennial) Mold Control  . Maintain humidity below 50%. . Get rid of mold growth on hard surfaces with water, detergent and, if necessary, 5% bleach (do not mix with other cleaners). Then dry the area completely. If mold covers an area more than 10 square feet, consider hiring an indoor environmental professional. . For clothing, washing with soap and water is best. If moldy items cannot be cleaned and dried, throw them away. . Remove sources e.g. contaminated carpets. . Repair and seal leaking roofs or pipes. Using dehumidifiers in damp basements may be helpful, but empty the water and clean units regularly to prevent mildew from forming. All rooms, especially basements, bathrooms and kitchens, require ventilation and cleaning to deter mold and mildew growth. Avoid carpeting on concrete or damp floors, and storing items in damp areas.   Skin care recommendations  Bath time: . Always use lukewarm water. AVOID very hot or cold water. Marland Kitchen Keep bathing time to 5-10 minutes. . Do NOT use bubble bath. . Use a mild soap and use just enough to wash the dirty areas. . Do NOT scrub skin vigorously.  . After bathing, pat dry your skin with a towel. Do NOT rub or scrub the skin.  Moisturizers and prescriptions:  . ALWAYS apply moisturizers immediately after bathing (within 3 minutes). This helps to lock-in moisture. . Use the moisturizer several times a day over the whole body. Peri Jefferson summer moisturizers include: Aveeno, CeraVe, Cetaphil. Peri Jefferson winter moisturizers include: Aquaphor, Vaseline, Cerave, Cetaphil, Eucerin, Vanicream. . When using moisturizers along with medications, the moisturizer should be applied about one hour  after applying the medication to prevent diluting effect of the medication or moisturize around where you applied the medications. When not using medications, the moisturizer can be continued twice daily as maintenance.  Laundry and clothing: . Avoid laundry products with added color or perfumes. . Use unscented hypo-allergenic laundry products such as Tide free, Cheer free & gentle, and All free and clear.  . If the skin still seems dry or sensitive, you can try double-rinsing the clothes. . Avoid tight or scratchy clothing such as wool. . Do not use fabric softeners or dyer sheets.

## 2020-05-05 DIAGNOSIS — J3089 Other allergic rhinitis: Secondary | ICD-10-CM | POA: Insufficient documentation

## 2020-05-05 DIAGNOSIS — T781XXA Other adverse food reactions, not elsewhere classified, initial encounter: Secondary | ICD-10-CM | POA: Insufficient documentation

## 2020-05-05 NOTE — Assessment & Plan Note (Addendum)
Discussed the risks and benefits of COVID-19 vaccination.   Patient has no contraindications in her medical history.   Recommend getting the COVID-19 vaccine.   Discussed that our office has been giving injections to our patiens who are concerned about possible allergic reactions afterwards and she can schedule to receive her injection at our office if interested. However, this is not necessary and she does not need component testing either.

## 2020-05-05 NOTE — Assessment & Plan Note (Signed)
Beans and eggs cause increased gas and diarrhea. Fish and strawberries cause pruritus. Interested in testing for foods as well.  Today's skin testing showed: Negative to foods.   Continue to avoid foods that bother you - fish, strawberries, beans, eggs.   For mild symptoms you can take over the counter antihistamines such as Benadryl and monitor symptoms closely. If symptoms worsen or if you have severe symptoms including breathing issues, throat closure, significant swelling, whole body hives, severe diarrhea and vomiting, lightheadedness then seek immediate medical care.

## 2020-05-05 NOTE — Assessment & Plan Note (Signed)
Mild rhino conjunctivitis symptoms with unknown triggers. Takes OTC antihistamines with good benefit. No prior allergy testing.  Today's skin testing showed: Positive to trees, mold and dust mites.  Start environmental control measures as below.  May use over the counter antihistamines such as Zyrtec (cetirizine), Claritin (loratadine), Allegra (fexofenadine), or Xyzal (levocetirizine) daily as needed.  May use olopatadine eye drops 0.2% once a day as needed for itchy/watery eyes.

## 2020-05-05 NOTE — Assessment & Plan Note (Signed)
   See assessment and plan as above for allergic rhinitis.  

## 2020-05-05 NOTE — Assessment & Plan Note (Signed)
Patient developed hives after taking minocycline and some type of cough medicine from [place]. She is concerned about getting the COVID-19 vaccine due to this.  Discussed with patient that I don't have a test that checks for what drugs a patient is allergic to - this type of testing does not exist.   Continue to avoid minocycline.

## 2020-05-09 ENCOUNTER — Encounter: Payer: Self-pay | Admitting: Physical Therapy

## 2020-05-09 ENCOUNTER — Other Ambulatory Visit: Payer: Self-pay

## 2020-05-09 ENCOUNTER — Ambulatory Visit: Payer: Self-pay | Attending: Internal Medicine | Admitting: Physical Therapy

## 2020-05-09 DIAGNOSIS — M5440 Lumbago with sciatica, unspecified side: Secondary | ICD-10-CM | POA: Insufficient documentation

## 2020-05-09 DIAGNOSIS — R293 Abnormal posture: Secondary | ICD-10-CM | POA: Insufficient documentation

## 2020-05-09 DIAGNOSIS — M6281 Muscle weakness (generalized): Secondary | ICD-10-CM | POA: Insufficient documentation

## 2020-05-09 NOTE — Therapy (Signed)
[facility] [address] [place], [place], [address] Phone: [phone]   Fax:  [phone]  Physical Therapy Treatment  Patient Details  Name: Denise Arias MRN: [id number] Date of Birth: [birth year] Referring Provider (PT): Dr. Jonah Blue    Encounter Date: 05/09/2020   PT End of Session - 05/09/20 1750    Visit Number 2    Number of Visits 12    Date for PT Re-Evaluation 06/16/20    Authorization Type [employer]    PT Start Time 1725   arrived a few minutes late   PT Stop Time 1759    PT Time Calculation (min) 34 min    Activity Tolerance Patient tolerated treatment well    Behavior During Therapy [facility] for tasks assessed/performed           Past Medical History:  Diagnosis Date  . Allergy    RHINITIS  . GERD (gastroesophageal reflux disease)     Past Surgical History:  Procedure Laterality Date  . NO PAST SURGERIES    . VAGINAL DELIVERY     X 4    There were no vitals filed for this visit.   Subjective Assessment - 05/09/20 1733    Subjective Pt. reports has been using Icy Hot patches on lower back which has helped with pain. LBP 2/10 pre-tx. Continues otherwise with moderate LBP with right>left LE radiating into upper legs with pain on sit>stand and with prolonged standing.    Limitations Sitting;Lifting;Standing;Walking;House hold activities    Currently in Pain? Yes    Pain Score 2     Pain Location Back    Pain Orientation Left;Right    Pain Descriptors / Indicators Tightness;Aching    Pain Type Chronic pain    Pain Radiating Towards upper legs right>left    Pain Onset More than a month ago    Pain Frequency Intermittent    Aggravating Factors  standing and walkint    Pain Relieving Factors rest, OTC meds, Icy Hot patches              [facility] PT Assessment - 05/09/20 0001      Special Tests   Other special tests longsitting test (+) for right anterior innominate rotation                          [facility] Adult PT Treatment/Exercise - 05/09/20 0001      Self-Care   ADL's instruction/review body mechanics for lifting with brief practice lifting unweighted crate from floor-also reviewed standing posture for activities such as dishes, avoidance of twisting with bending and for maintaining neutral spine      Exercises   Exercises Lumbar      Lumbar Exercises: Stretches   Passive Hamstring Stretch Right;Left;2 reps;30 seconds    Single Knee to Chest Stretch Right;Left;3 reps;10 seconds    Single Knee to Chest Stretch Limitations with strap assist    Lower Trunk Rotation Limitations x 10 reps ea. way bilat. brief holds <5 sec    Piriformis Stretch Right;3 reps;20 seconds    Piriformis Stretch Limitations attempted on left also but held due to pt. unable to feel stretch/minimal tightness noted      Lumbar Exercises: Standing   Functional Squats 10 reps    Functional Squats Limitations squat at counter      Lumbar Exercises: Supine   Pelvic Tilt 15 reps    Pelvic Tilt Limitations 3 sec holds  Clam 15 reps    Clam Limitations red Theraband    Bent Knee Raise 10 reps    Other Supine Lumbar Exercises hip adduction isometric with small ball 3 sec x 10      Manual Therapy   Manual Therapy Muscle Energy Technique    Muscle Energy Technique Right hamstring isometric with left hip flexion isometric for right anterior innominate rotation 2 sets pf 5 for 5 sec holds ea.                  PT Education - 05/09/20 1811    Education Details exercises, spine/SI anatomy, body mechanics for chores, lifting, pain education "hurt vs. harm"    Person(s) Educated Patient    Methods Explanation;Demonstration;Verbal cues    Comprehension Returned demonstration;Verbalized understanding               PT Long Term Goals - 05/02/20 1730      PT LONG TERM GOAL #1   Title Pt will be I with HEP for trunk flexibility and hip/core strength    Baseline given on  eval    Time 6    Period Weeks    Status New    Target Date 06/16/20      PT LONG TERM GOAL #2   Title Pt will be able to perform transitional movements without increasing back pain (sit to stand, sit to supine, etc)    Time 6    Period Weeks    Status New    Target Date 06/16/20      PT LONG TERM GOAL #3   Title Patient will be able to complete light housework without increasing symptoms of back pain    Baseline poor body mechanics and body awareness    Time 6    Period Weeks    Status New    Target Date 06/16/20      PT LONG TERM GOAL #4   Title Pt will be able to lift 15lbs from the floor with proper mechanics and no increased back pain    Time 66    Period Weeks    Status New    Target Date 06/16/20      PT LONG TERM GOAL #5   Title Pt will walk without a limp and improved ease for community distances    Time 6    Period Weeks    Status New    Target Date 06/16/20                 Plan - 05/09/20 1807    Clinical Impression Statement Tx. focus exercises for lumbar stretches/ROM and core strengthening with trial flexion bias given underlying facet arthritis. Checked for innominate rotation as well with findings of right anterior (rotation) so added METs as noted in flowsheet to address. Overall session was well-tolerated-given symptom chronicity expect gradual progress re: therapy goals so will continue to progress as tolerated with exercises/functional activity progression.    Personal Factors and Comorbidities Past/Current Experience;Social Background;Time since onset of injury/illness/exacerbation    Examination-Activity Limitations Stairs;Stand;Bed Mobility;Bend;Sit;Transfers;Sleep;Lift;Caring for Others;Carry;Squat;Locomotion Level    Examination-Participation Restrictions Interpersonal Relationship;Cleaning;Community Activity;Laundry;Meal Prep;Shop    Stability/Clinical Decision Making Evolving/Moderate complexity    Clinical Decision Making Moderate     Rehab Potential Good    PT Frequency 2x / week    PT Duration 6 weeks    PT Treatment/Interventions ADLs/Self Care Home Management;Electrical Stimulation;Cryotherapy;Moist Heat;Functional mobility training;Therapeutic exercise;Neuromuscular re-education;Patient/family education;Manual techniques;Dry needling    PT Next  Visit Plan check for innominate rotation and perform METs prn to address, continue lumbar/core strengthening and stabilization, stretches, flexion bias ROM as tolerated    PT Home Exercise Plan [id number]: pt ed/lifting, pelvic tilt, cat/cow and childs pose    Consulted and Agree with Plan of Care Patient           Patient will benefit from skilled therapeutic intervention in order to improve the following deficits and impairments:  Decreased mobility, Impaired flexibility, Postural dysfunction, Obesity, Pain, Increased fascial restricitons, Hypomobility, Decreased skin integrity, Decreased range of motion, Decreased activity tolerance, Abnormal gait  Visit Diagnosis: Acute bilateral low back pain with sciatica, sciatica laterality unspecified  Abnormal posture  Muscle weakness (generalized)     Problem List Patient Active Problem List   Diagnosis Date Noted  . Adverse food reaction 05/05/2020  . Other allergic rhinitis 05/05/2020  . Vaccine counseling 05/05/2020  . Allergic conjunctivitis of both eyes 05/05/2020  . Prediabetes 04/11/2020  . Chronic midline low back pain without sciatica 02/21/2020  . 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 Class 2 obesity due to excess calories without serious comorbidity with body mass index (BMI) of 35.0 to 35.9 in adult 02/21/2020  . Allergic reaction 03/03/2017  . Skin sensitivity 03/03/2017  . Drug reaction 03/03/2017  . Frequent headaches 11/15/2016  . Palpitations 11/15/2016  . Neck pain 11/15/2016  . Family history of thyroid disease in sister 11/15/2016  . Sleep disturbance 11/15/2016  . Fatigue 11/15/2016  . Heat  intolerance 11/15/2016  . 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 GERD (gastroesophageal reflux disease) 05/27/2013    Lazarus Gowda, PT, DPT 05/09/20 6:14 PM  [facility] [address] [place], [place], [address] Phone: [phone]   Fax:  [phone]  Name: Denise Arias MRN: [id number] Date of Birth: 08-22-1981

## 2020-05-16 ENCOUNTER — Ambulatory Visit: Payer: Medicaid Other | Attending: Internal Medicine

## 2020-05-16 DIAGNOSIS — Z23 Encounter for immunization: Secondary | ICD-10-CM

## 2020-05-16 NOTE — Progress Notes (Signed)
   Covid-19 Vaccination Clinic  Name:  Denise Arias    MRN: [id number] DOB: April 17, 1981  05/16/2020  [name] was observed post Covid-19 immunization for 30 minutes based on pre-vaccination screening without incident. She was provided with Vaccine Information Sheet and instruction to access the V-Safe system.   [name] was instructed to call 911 with any severe reactions post vaccine: Marland Kitchen Difficulty breathing  . Swelling of face and throat  . A fast heartbeat  . A bad rash all over body  . Dizziness and weakness   Immunizations Administered    Name Date Dose VIS Date Route   Pfizer COVID-19 Vaccine 05/16/2020  3:58 PM 0.3 mL 10/27/2018 Intramuscular   Manufacturer: [employer], [employer]   Lot: [id number]   NDC: [id number]

## 2020-05-18 ENCOUNTER — Ambulatory Visit: Payer: Self-pay | Admitting: Physical Therapy

## 2020-05-22 ENCOUNTER — Encounter: Payer: Medicaid Other | Admitting: Physical Therapy

## 2020-05-24 ENCOUNTER — Ambulatory Visit: Payer: Medicaid Other | Admitting: Physical Therapy

## 2020-05-30 ENCOUNTER — Encounter: Payer: Medicaid Other | Admitting: Physical Therapy

## 2020-06-01 ENCOUNTER — Encounter: Payer: Medicaid Other | Admitting: Physical Therapy

## 2020-06-06 ENCOUNTER — Ambulatory Visit: Payer: Medicaid Other | Attending: Internal Medicine

## 2020-06-06 DIAGNOSIS — Z23 Encounter for immunization: Secondary | ICD-10-CM

## 2020-06-06 NOTE — Progress Notes (Signed)
   Covid-19 Vaccination Clinic  Name:  Denise Arias    MRN: [id number] DOB: [birth year]  06/06/2020  [name] was observed post Covid-19 immunization for 15 minutes without incident. She was provided with Vaccine Information Sheet and instruction to access the V-Safe system.   [name] was instructed to call 911 with any severe reactions post vaccine: Marland Kitchen Difficulty breathing  . Swelling of face and throat  . A fast heartbeat  . A bad rash all over body  . Dizziness and weakness   Immunizations Administered    Name Date Dose VIS Date Route   Pfizer COVID-19 Vaccine 06/06/2020  3:24 PM 0.3 mL 10/27/2018 Intramuscular   Manufacturer: [employer], [employer]   Lot: [id number]   NDC: [address]-1

## 2020-06-12 ENCOUNTER — Ambulatory Visit: Payer: Self-pay | Admitting: Internal Medicine

## 2020-06-12 NOTE — Telephone Encounter (Signed)
Pt reports had second vaccine , Pfizer, last Tuesday. Reports persistent headache since first day. Constant 5/10,. Is taking 1 Advil daily "Because I really don't like to take medicine." States headache mild first 2 days, worsening ast 3 days "Advil helps for a while but comes back in a couple hours."  Advised to try tylenol alternating with IBU, pt declines, "I don't like to take that."  Does not check BP at home. Advised symptoms with 2nd vaccine may be more severe, home care advise given. After hours call. Assured NT would route to practice for Dr. Henriette Combs review.  Reason for Disposition  COVID-19 vaccine, systemic reactions (e.g., fatigue, fever, muscle aches), questions about  Answer Assessment - Initial Assessment Questions 1. MAIN CONCERN OR SYMPTOM:  "What is your main concern right now?" "What question do you have?" "What's the main symptom you're worried about?" (e.g., fever, pain, redness, swelling)     Headache 2. VACCINE: "What vaccination did you receive?" "Is this your first or second shot?" (e.g., none; AstraZeneca, J&J, Moderna, [employer], other)     Pfizer 3. SYMPTOM ONSET: "When did the *No Answer* begin?" (e.g., not relevant; hours, days)       Days ago 4. SYMPTOM SEVERITY: "How bad is it?"      5/10 5. FEVER: "Is there a fever?" If Yes, ask: "What is it, how was it measured, and when did it start?"      no 6. PAST REACTIONS: "Have you reacted to immunizations before?" If Yes, ask: "What happened?"     Mild headache 7. OTHER SYMPTOMS: "Do you have any other symptoms?"     no  Protocols used: CORONAVIRUS (COVID-19) VACCINE QUESTIONS AND REACTIONS-A-AH

## 2020-06-13 NOTE — Telephone Encounter (Signed)
Message given to pt / appt scheduled on 07/16/2020

## 2020-06-15 ENCOUNTER — Ambulatory Visit: Payer: Self-pay | Attending: Internal Medicine | Admitting: Internal Medicine

## 2020-06-15 ENCOUNTER — Other Ambulatory Visit: Payer: Self-pay

## 2020-06-15 DIAGNOSIS — R519 Headache, unspecified: Secondary | ICD-10-CM

## 2020-06-15 NOTE — Progress Notes (Signed)
Virtual Visit via Video Note  I connected with Denise Arias on 06/15/20 at  3:00 PM EDT by a video enabled telemedicine application and verified that I am speaking with the correct person using two identifiers.  39 yo healthy female. Took the [employer] covid vaccine last week and she is having symptoms such as headaches.  Received 2nd covid vaccine last week (Tuesday).  After immunization whe had a slight fever- took tylenol  After finishing acetaminophen she deveolped headache (she thinks tylenol masked headache).  Headache - likely post immunization related.  No treatment necessary

## 2020-06-22 ENCOUNTER — Ambulatory Visit (HOSPITAL_COMMUNITY)
Admission: EM | Admit: 2020-06-22 | Discharge: 2020-06-22 | Disposition: A | Discharge status: Home / Self Care | Payer: 59 | Attending: Family Medicine | Admitting: Family Medicine

## 2020-06-22 ENCOUNTER — Encounter (HOSPITAL_COMMUNITY): Payer: Self-pay

## 2020-06-22 ENCOUNTER — Other Ambulatory Visit: Payer: Self-pay

## 2020-06-22 DIAGNOSIS — M5431 Sciatica, right side: Secondary | ICD-10-CM

## 2020-06-22 DIAGNOSIS — M4726 Other spondylosis with radiculopathy, lumbar region: Secondary | ICD-10-CM | POA: Diagnosis not present

## 2020-06-22 DIAGNOSIS — S39012A Strain of muscle, fascia and tendon of lower back, initial encounter: Secondary | ICD-10-CM

## 2020-06-22 MED ORDER — METHYLPREDNISOLONE 4 MG PO TBPK
ORAL_TABLET | ORAL | 0 refills | Status: DC
Start: 2020-06-22 — End: 2020-07-24

## 2020-06-22 MED ORDER — TIZANIDINE HCL 4 MG PO TABS: 4.0000 mg | ORAL_TABLET | Freq: Four times a day (QID) | ORAL | 0 refills | Status: DC | PRN

## 2020-06-22 MED ORDER — TRAMADOL HCL 50 MG PO TABS: 50.0000 mg | ORAL_TABLET | Freq: Four times a day (QID) | ORAL | 0 refills | Status: DC | PRN

## 2020-06-22 NOTE — ED Provider Notes (Signed)
[facility]    CSN: [id number] Arrival date & time: 06/22/20  1536      History   Chief Complaint Chief Complaint  Patient presents with  . Back Pain    since yesterday    HPI Denise Arias is a 39 y.o. female.   HPI   Patient is here for low back pain.  She had a low back pain acutely that started today.  States she was bent over in the kitchen trying to make food, and when she stood up she had a sudden severe pain in her low back.  Is in the central low back.  It goes into the right hip and into the right leg.  Pain to the right leg with a feeling of weakness and like it will give out.  Patient has not taken any medication.  She tried some heat to the area.  No bowel or bladder complaint.  Patient states that she has had back pain off and on ever since 2008.  She thinks it is from an epidural.  She has been seen by her physician.  She had x-rays done.  She was told she had a desiccated disc.  She has not taken any medication.  She was sent for physical therapy.  She insists her physical therapist told her that she did not have the type of problems that was amenable to physical therapy.  It appears from her physical therapy consultation and notes that she would benefit from therapy, she has muscular deconditioning, obesity, and mechanical problems that may improve with exercise.  Patient also states that the nurse that gave her her Covid shot told her not to get any x-rays, imaging, MRI for at least 6 weeks.  When I told her that this was not true she told me that she was absolutely certain the nurse told her this for a reason.  She is here with her husband.  I told her that there may be a language misunderstanding.  Past Medical History:  Diagnosis Date  . Allergy    RHINITIS  . GERD (gastroesophageal reflux disease)     Patient Active Problem List   Diagnosis Date Noted  . Adverse food reaction 05/05/2020  . Other allergic rhinitis 05/05/2020  . Vaccine  counseling 05/05/2020  . Allergic conjunctivitis of both eyes 05/05/2020  . Prediabetes 04/11/2020  . 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 Class 2 obesity due to excess calories without serious comorbidity with body mass index (BMI) of 35.0 to 35.9 in adult 02/21/2020  . Drug reaction 03/03/2017  . Frequent headaches 11/15/2016  . Palpitations 11/15/2016  . Family history of thyroid disease in sister 11/15/2016  . 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 NO PAST SURGERIES    . VAGINAL DELIVERY     X 4    OB History    Gravida  5   Para  5   Term  5   Preterm      AB      Living  5     SAB      TAB      Ectopic      Multiple  0   Live Births  1            Home Medications    Prior to Admission medications   Medication Sig Start Date End Date Taking? Authorizing Provider  methylPREDNISolone (MEDROL DOSEPAK) 4 MG TBPK tablet tad 06/22/20  Eustace Moore, MD  tiZANidine (ZANAFLEX) 4 MG tablet Take 1-2 tablets (4-8 mg total) by mouth every 6 (six) hours as needed for muscle spasms. 06/22/20   Eustace Moore, MD  traMADol (ULTRAM) 50 MG tablet Take 1 tablet (50 mg total) by mouth every 6 (six) hours as needed. 06/22/20   Eustace Moore, MD    Family History Family History  Problem Relation Age of Onset  . Diabetes Mother   . Hypertension Mother   . Thyroid disease Sister        goiter  . Diabetes Sister   . Other Brother        murdered  . Asthma Maternal Uncle     Social History Social History   Tobacco Use  . Smoking status: Never Smoker  . Smokeless tobacco: Never Used  Vaping Use  . Vaping Use: Never used  Substance Use Topics  . Alcohol use: No  . Drug use: No     Allergies   Amoxicillin, Banana, Fish oil, Minocycline, Multivitamins, and Strawberry extract   Review of Systems Review of Systems See HPI  Physical Exam Triage Vital Signs ED Triage Vitals  Enc Vitals  Group     BP 06/22/20 1713 116/76     Pulse Rate 06/22/20 1713 71     Resp 06/22/20 1713 18     Temp --      Temp Source 06/22/20 1713 Oral     SpO2 06/22/20 1713 100 %     Weight --      Height --      Head Circumference --      Peak Flow --      Pain Score 06/22/20 1711 7     Pain Loc --      Pain Edu? --      Excl. in GC? --    No data found.  Updated Vital Signs BP 116/76 (BP Location: Right Arm)   Pulse 71   Resp 18   LMP 06/13/2020 (Approximate)   SpO2 100%   Physical Exam Constitutional:      General: She is not in acute distress.    Appearance: She is well-developed. She is obese.     Comments: Appears uncomfortable  HENT:     Head: Normocephalic and atraumatic.     Mouth/Throat:     Comments: Mask is in place Eyes:     Conjunctiva/sclera: Conjunctivae normal.     Pupils: Pupils are equal, round, and reactive to light.  Cardiovascular:     Rate and Rhythm: Normal rate.  Pulmonary:     Effort: Pulmonary effort is normal. No respiratory distress.  Abdominal:     General: There is no distension.     Palpations: Abdomen is soft.  Musculoskeletal:        General: Normal range of motion.     Cervical back: Normal range of motion.     Comments: There is tenderness to palpation at the L5-S1 junction centrally.  No tenderness over the posterior pelvis or SI.  There is mild increased muscle tension and tenderness bilaterally over the lumbar column of muscles.  Reflexes are intact bilaterally.  Strength sensation intact bilaterally.  Straight leg raise on the left is negative.  On the right at the full leg extension there is some increase in the back pain.  Skin:    General: Skin is warm and dry.  Neurological:     General: No focal deficit present.  Mental Status: She is alert.     Motor: No weakness.     Coordination: Coordination normal.     Gait: Gait normal.     Deep Tendon Reflexes: Reflexes normal.     Comments: Guarded movements    X-rays are  reviewed.  Discussed with patient. Physical therapy notes are reviewed Notes from her Covid injection I reviewed UC Treatments / Results  Labs (all labs ordered are listed, but only abnormal results are displayed) Labs Reviewed - No data to display  EKG   Radiology No results found.  Procedures Procedures (including critical care time)  Medications Ordered in UC Medications - No data to display  Initial Impression / Assessment and Plan / UC Course  I have reviewed the triage vital signs and the nursing notes.  Pertinent labs & imaging results that were available during my care of the patient were reviewed by me and considered in my medical decision making (see chart for details).    I discussed with the patient that she needs to see her primary care doctor in follow-up. We will treat her with a prednisone pack and some pain management for her acute pain. Patient appears to be unwilling to engage in physical therapy although I have told her that I think exercising and walking will be beneficial to her I told the patient that at some point she may need additional imaging for her back, but she was not at that point yet.  I told her that imaging is only indicated if she fails all conservative treatment and needs additional treatment such as injections or surgery.  Final Clinical Impressions(s) / UC Diagnoses   Final diagnoses:  Osteoarthritis of spine with radiculopathy, lumbar region  Strain of lumbar region, initial encounter  Right sided sciatica     Discharge Instructions     Avoid bedrest.  Get up and walk throughout the day to keep from getting so stiff and sore Warmth to area may help Take the Medrol Dosepak as directed.  This is a steroid anti-inflammatory medicine.  It is to help with the pinched nerve pain.  Take all of day 1 today, 3 pills now and 3 at bedtime Take tizanidine as needed as muscle relaxer.  This is useful at bedtime Take tramadol if needed for  severe pain.  This can cause drowsiness.  Do not drive on the tramadol. Call your doctor's office tomorrow to set up an appointment for next week for follow-up    ED Prescriptions    Medication Sig Dispense Auth. Provider   methylPREDNISolone (MEDROL DOSEPAK) 4 MG TBPK tablet tad 21 tablet Eustace Moore, MD   traMADol (ULTRAM) 50 MG tablet Take 1 tablet (50 mg total) by mouth every 6 (six) hours as needed. 15 tablet Eustace Moore, MD   tiZANidine (ZANAFLEX) 4 MG tablet Take 1-2 tablets (4-8 mg total) by mouth every 6 (six) hours as needed for muscle spasms. 21 tablet Eustace Moore, MD     I have reviewed the PDMP during this encounter.   Eustace Moore, MD 06/22/20 2023

## 2020-06-22 NOTE — ED Triage Notes (Signed)
Pt states she has had severe lower back pain since yesterday and it may have been due to a lifting or standing movement. Pt is aox4 and ambulates with pain.

## 2020-06-22 NOTE — Discharge Instructions (Signed)
Avoid bedrest.  Get up and walk throughout the day to keep from getting so stiff and sore Warmth to area may help Take the Medrol Dosepak as directed.  This is a steroid anti-inflammatory medicine.  It is to help with the pinched nerve pain.  Take all of day 1 today, 3 pills now and 3 at bedtime Take tizanidine as needed as muscle relaxer.  This is useful at bedtime Take tramadol if needed for severe pain.  This can cause drowsiness.  Do not drive on the tramadol. Call your doctor's office tomorrow to set up an appointment for next week for follow-up

## 2020-07-10 ENCOUNTER — Other Ambulatory Visit: Payer: Self-pay

## 2020-07-10 ENCOUNTER — Encounter: Payer: Self-pay | Admitting: Internal Medicine

## 2020-07-10 ENCOUNTER — Ambulatory Visit: Payer: 59 | Attending: Internal Medicine | Admitting: Internal Medicine

## 2020-07-10 VITALS — BP 121/81 | HR 61 | Temp 98.3°F | Resp 16 | Wt 196.0 lb

## 2020-07-10 DIAGNOSIS — M545 Low back pain, unspecified: Secondary | ICD-10-CM | POA: Diagnosis not present

## 2020-07-10 DIAGNOSIS — N62 Hypertrophy of breast: Secondary | ICD-10-CM

## 2020-07-10 DIAGNOSIS — E65 Localized adiposity: Secondary | ICD-10-CM

## 2020-07-10 DIAGNOSIS — G8929 Other chronic pain: Secondary | ICD-10-CM | POA: Diagnosis not present

## 2020-07-10 DIAGNOSIS — Z2821 Immunization not carried out because of patient refusal: Secondary | ICD-10-CM

## 2020-07-10 DIAGNOSIS — E669 Obesity, unspecified: Secondary | ICD-10-CM

## 2020-07-10 MED ORDER — MELOXICAM 15 MG PO TABS: 15.0000 mg | ORAL_TABLET | Freq: Every day | ORAL | 0 refills | Status: DC

## 2020-07-10 NOTE — Progress Notes (Signed)
Back pain- has taken 2 doses of Tramadol but stopped because her eyes are red and "heart works fast"  Using icy hot pad for back pain
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
## 2020-07-14 NOTE — Therapy (Signed)
[place], [place], [address] Phone: [phone]   Fax:  [phone]  Physical Therapy Treatment/Discharge  Patient Details  Name: Denise Arias MRN: [id number] Date of Birth: Nov 17, 1980 Referring Provider (PT): Dr. Karle Plumber    Encounter Date: 05/09/2020    Past Medical History:  Diagnosis Date  . Allergy    RHINITIS  . GERD (gastroesophageal reflux disease)     Past Surgical History:  Procedure Laterality Date  . NO PAST SURGERIES    . VAGINAL DELIVERY     X 4    There were no vitals filed for this visit.                                   PT Long Term Goals - 05/02/20 1730      PT LONG TERM GOAL #1   Title Pt will be I with HEP for trunk flexibility and hip/core strength    Baseline given on eval    Time 6    Period Weeks    Status New    Target Date 06/16/20      PT LONG TERM GOAL #2   Title Pt will be able to perform transitional movements without increasing back pain (sit to stand, sit to supine, etc)    Time 6    Period Weeks    Status New    Target Date 06/16/20      PT LONG TERM GOAL #3   Title Patient will be able to complete light housework without increasing symptoms of back pain    Baseline poor body mechanics and body awareness    Time 6    Period Weeks    Status New    Target Date 06/16/20      PT LONG TERM GOAL #4   Title Pt will be able to lift 15lbs from the floor with proper mechanics and no increased back pain    Time 66    Period Weeks    Status New    Target Date 06/16/20      PT LONG TERM GOAL #5   Title Pt will walk without a limp and improved ease for community distances    Time 6    Period Weeks    Status New    Target Date 06/16/20                  Patient will benefit from skilled therapeutic intervention in order to improve the following deficits and impairments:  Decreased mobility, Impaired  flexibility, Postural dysfunction, Obesity, Pain, Increased fascial restricitons, Hypomobility, Decreased skin integrity, Decreased range of motion, Decreased activity tolerance, Abnormal gait  Visit Diagnosis: Acute bilateral low back pain with sciatica, sciatica laterality unspecified  Abnormal posture  Muscle weakness (generalized)     Problem List Patient Active Problem List   Diagnosis Date Noted  . Influenza vaccination declined 07/10/2020  . Abdominal obesity 07/10/2020  . Obesity (BMI 30.0-34.9) 07/10/2020  . Adverse food reaction 05/05/2020  . Other allergic rhinitis 05/05/2020  . Vaccine counseling 05/05/2020  . Allergic conjunctivitis of both eyes 05/05/2020  . Prediabetes 04/11/2020  . 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 Class 2 obesity due to excess calories without serious comorbidity with body mass index (BMI) of 35.0 to 35.9 in adult 02/21/2020  . Drug reaction 03/03/2017  . Frequent headaches 11/15/2016  . Palpitations 11/15/2016  . Family history  of thyroid disease in sister 11/15/2016  . 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 No further visits scheduled/planned at this time.   Remaining deficits: LBP   Education / Equipment: Previous HEP instruction Plan: Patient agrees to discharge.  Patient goals were not met. Patient is being discharged due to not returning since the last visit.  ?????           Beaulah Dinning, PT, DPT 07/14/20 12:40 PM      [facility] [address] [place], [place], [address] Phone: [phone]   Fax:  [phone]  Name: Lahoma Constantin MRN: [id number] Date of Birth: 12-17-1980

## 2020-07-23 ENCOUNTER — Other Ambulatory Visit: Payer: Self-pay

## 2020-07-23 ENCOUNTER — Encounter (HOSPITAL_COMMUNITY): Payer: Self-pay | Admitting: Emergency Medicine

## 2020-07-23 ENCOUNTER — Emergency Department (HOSPITAL_COMMUNITY)
Admission: EM | Admit: 2020-07-23 | Discharge: 2020-07-24 | Disposition: A | Discharge status: Home / Self Care | Payer: 59 | Attending: Emergency Medicine | Admitting: Emergency Medicine

## 2020-07-23 DIAGNOSIS — F410 Panic disorder [episodic paroxysmal anxiety] without agoraphobia: Secondary | ICD-10-CM | POA: Insufficient documentation

## 2020-07-23 DIAGNOSIS — Z79899 Other long term (current) drug therapy: Secondary | ICD-10-CM | POA: Diagnosis not present

## 2020-07-23 DIAGNOSIS — Z20822 Contact with and (suspected) exposure to covid-19: Secondary | ICD-10-CM | POA: Diagnosis not present

## 2020-07-23 DIAGNOSIS — M791 Myalgia, unspecified site: Secondary | ICD-10-CM | POA: Diagnosis present

## 2020-07-23 LAB — CBG MONITORING, ED: Glucose-Capillary: 139 mg/dL — ABNORMAL HIGH (ref 70–99)

## 2020-07-23 NOTE — ED Triage Notes (Signed)
Pt reports having chills and body aches that began getting worse today.

## 2020-07-24 LAB — BASIC METABOLIC PANEL
Anion gap: 8 (ref 5–15)
BUN: 16 mg/dL (ref 6–20)
CO2: 22 mmol/L (ref 22–32)
Calcium: 8.6 mg/dL — ABNORMAL LOW (ref 8.9–10.3)
Chloride: 108 mmol/L (ref 98–111)
Creatinine, Ser: 0.76 mg/dL (ref 0.44–1.00)
GFR, Estimated: >60 mL/min (ref >60)
Glucose, Bld: 125 mg/dL — ABNORMAL HIGH (ref 70–99)
Potassium: 4 mmol/L (ref 3.5–5.1)
Sodium: 138 mmol/L (ref 135–145)

## 2020-07-24 LAB — CBC WITH DIFFERENTIAL/PLATELET
Abs Immature Granulocytes: 0.02 10*3/uL (ref 0.00–0.07)
Basophils Absolute: 0 10*3/uL (ref 0.0–0.1)
Basophils Relative: 1 %
Eosinophils Absolute: 0.2 10*3/uL (ref 0.0–0.5)
Eosinophils Relative: 2 %
HCT: 35.3 % — ABNORMAL LOW (ref 36.0–46.0)
Hemoglobin: 11.5 g/dL — ABNORMAL LOW (ref 12.0–15.0)
Immature Granulocytes: 0 %
Lymphocytes Relative: 17 %
Lymphs Abs: 1.2 10*3/uL (ref 0.7–4.0)
MCH: 28.5 pg (ref 26.0–34.0)
MCHC: 32.6 g/dL (ref 30.0–36.0)
MCV: 87.6 fL (ref 80.0–100.0)
Monocytes Absolute: 0.6 10*3/uL (ref 0.1–1.0)
Monocytes Relative: 9 %
Neutro Abs: 5.1 10*3/uL (ref 1.7–7.7)
Neutrophils Relative %: 71 %
Platelets: 227 10*3/uL (ref 150–400)
RBC: 4.03 MIL/uL (ref 3.87–5.11)
RDW: 13.3 % (ref 11.5–15.5)
WBC: 7.2 10*3/uL (ref 4.0–10.5)
nRBC: 0 % (ref 0.0–0.2)

## 2020-07-24 LAB — RESP PANEL BY RT-PCR (FLU A&B, COVID) ARPGX2
Influenza A by PCR: NEGATIVE
Influenza B by PCR: NEGATIVE
SARS Coronavirus 2 by RT PCR: NEGATIVE

## 2020-07-24 LAB — URINALYSIS, ROUTINE W REFLEX MICROSCOPIC
Bilirubin Urine: NEGATIVE
Glucose, UA: NEGATIVE mg/dL
Hgb urine dipstick: NEGATIVE
Ketones, ur: NEGATIVE mg/dL
Leukocytes,Ua: NEGATIVE
Nitrite: NEGATIVE
Protein, ur: NEGATIVE mg/dL
Specific Gravity, Urine: 1.004 — ABNORMAL LOW (ref 1.005–1.030)
pH: 7 (ref 5.0–8.0)

## 2020-07-24 LAB — PREGNANCY, URINE: Preg Test, Ur: NEGATIVE

## 2020-07-24 NOTE — ED Provider Notes (Signed)
[facility]-EMERGENCY DEPT Provider Note: Denise Dell, MD, FACEP  CSN: [id number] MRN: [id number] ARRIVAL: 07/23/20 at 2207 ROOM: WA20/WA20   CHIEF COMPLAINT  Generalized Body Aches   HISTORY OF PRESENT ILLNESS  07/24/20 12:55 AM Denise Arias is a 39 y.o. female who had the sudden onset of generalized tremor, palpitations (rapid heartbeat) and a sense of impending doom that came on suddenly about 7 PM.  It lasted about 7 minutes.  It recurred and she came to the ED.  Her symptoms have subsequently resolved.  Symptoms were moderate and she is unaware of anything that may have triggered them.  She had not been feeling stress or anxious about anything.  She has had similar episodes in the past that did not last as long.  She has had no chest pain, had shortness of breath that only lasted a few seconds, and has had no nausea, vomiting or diarrhea.   Past Medical History:  Diagnosis Date  . Allergy    RHINITIS  . GERD (gastroesophageal reflux disease)     Past Surgical History:  Procedure Laterality Date  . NO PAST SURGERIES    . VAGINAL DELIVERY     X 4    Family History  Problem Relation Age of Onset  . Diabetes Mother   . Hypertension Mother   . Thyroid disease Sister        goiter  . Diabetes Sister   . Other Brother        murdered  . Asthma Maternal Uncle     Social History   Tobacco Use  . Smoking status: Never Smoker  . Smokeless tobacco: Never Used  Vaping Use  . Vaping Use: Never used  Substance Use Topics  . Alcohol use: No  . Drug use: No    Prior to Admission medications   Medication Sig Start Date End Date Taking? Authorizing Provider  meloxicam (MOBIC) 15 MG tablet Take 1 tablet (15 mg total) by mouth daily. 07/10/20   Marcine Matar, MD    Allergies Tizanidine, Tramadol, Amoxicillin, Banana, Fish oil, Minocycline, Multivitamins, and Strawberry extract   REVIEW OF SYSTEMS  Negative except as noted here or in the History of Present  Illness.   PHYSICAL EXAMINATION  Initial Vital Signs Blood pressure 137/79, pulse 65, temperature 98.7 F (37.1 C), temperature source Oral, resp. rate 18, height 5\' 4"  (1.626 m), weight 88.9 kg, last menstrual period 07/07/2020, SpO2 100 %.  Examination General: Well-developed, well-nourished female in no acute distress; appearance consistent with age of record HENT: normocephalic; atraumatic Eyes: pupils equal, round and reactive to light; extraocular muscles intact Neck: supple Heart: regular rate and rhythm Lungs: clear to auscultation bilaterally Abdomen: soft; nondistended; nontender; bowel sounds present Extremities: No deformity; full range of motion; pulses normal Neurologic: Awake, alert and oriented; motor function intact in all extremities and symmetric; no facial droop Skin: Warm and dry Psychiatric: Normal mood and affect   RESULTS  Summary of this visit's results, reviewed and interpreted by myself:   EKG Interpretation  Date/Time:  Sunday July 23 2020 22:40:55 EST Ventricular Rate:  62 PR Interval:    QRS Duration: 72 QT Interval:  378 QTC Calculation: 384 R Axis:   61 Text Interpretation: Sinus rhythm Normal ECG No significant change was found Confirmed by Samara Stankowski (04-23-1988) on 07/23/2020 11:32:24 PM      Laboratory Studies: Results for orders placed or performed during the hospital encounter of 07/23/20 (from the past 24 hour(s))  CBG  monitoring, ED     Status: Abnormal   Collection Time: 07/23/20 10:35 PM  Result Value Ref Range   Glucose-Capillary 139 (H) 70 - 99 mg/dL  Resp Panel by RT-PCR (Flu A&B, Covid) Nasopharyngeal Swab     Status: None   Collection Time: 07/23/20 11:14 PM   Specimen: Nasopharyngeal Swab; Nasopharyngeal(NP) swabs in vial transport medium  Result Value Ref Range   SARS Coronavirus 2 by RT PCR NEGATIVE NEGATIVE   Influenza A by PCR NEGATIVE NEGATIVE   Influenza B by PCR NEGATIVE NEGATIVE  Urinalysis, Routine w reflex  microscopic     Status: Abnormal   Collection Time: 07/23/20 11:15 PM  Result Value Ref Range   Color, Urine COLORLESS (A) YELLOW   APPearance CLEAR CLEAR   Specific Gravity, Urine 1.004 (L) 1.005 - 1.030   pH 7.0 5.0 - 8.0   Glucose, UA NEGATIVE NEGATIVE mg/dL   Hgb urine dipstick NEGATIVE NEGATIVE   Bilirubin Urine NEGATIVE NEGATIVE   Ketones, ur NEGATIVE NEGATIVE mg/dL   Protein, ur NEGATIVE NEGATIVE mg/dL   Nitrite NEGATIVE NEGATIVE   Leukocytes,Ua NEGATIVE NEGATIVE  Pregnancy, urine     Status: None   Collection Time: 07/23/20 11:15 PM  Result Value Ref Range   Preg Test, Ur NEGATIVE NEGATIVE  CBC with Differential/Platelet     Status: Abnormal   Collection Time: 07/24/20  1:15 AM  Result Value Ref Range   WBC 7.2 4.0 - 10.5 K/uL   RBC 4.03 3.87 - 5.11 MIL/uL   Hemoglobin 11.5 (L) 12.0 - 15.0 g/dL   HCT 82.9 (L) 36 - 46 %   MCV 87.6 80.0 - 100.0 fL   MCH 28.5 26.0 - 34.0 pg   MCHC 32.6 30.0 - 36.0 g/dL   RDW 56.2 13.0 - 86.5 %   Platelets 227 150 - 400 K/uL   nRBC 0.0 0.0 - 0.2 %   Neutrophils Relative % 71 %   Neutro Abs 5.1 1.7 - 7.7 K/uL   Lymphocytes Relative 17 %   Lymphs Abs 1.2 0.7 - 4.0 K/uL   Monocytes Relative 9 %   Monocytes Absolute 0.6 0.1 - 1.0 K/uL   Eosinophils Relative 2 %   Eosinophils Absolute 0.2 0.0 - 0.5 K/uL   Basophils Relative 1 %   Basophils Absolute 0.0 0.0 - 0.1 K/uL   Immature Granulocytes 0 %   Abs Immature Granulocytes 0.02 0.00 - 0.07 K/uL  Basic metabolic panel     Status: Abnormal   Collection Time: 07/24/20  1:15 AM  Result Value Ref Range   Sodium 138 135 - 145 mmol/L   Potassium 4.0 3.5 - 5.1 mmol/L   Chloride 108 98 - 111 mmol/L   CO2 22 22 - 32 mmol/L   Glucose, Bld 125 (H) 70 - 99 mg/dL   BUN 16 6 - 20 mg/dL   Creatinine, Ser 7.84 0.44 - 1.00 mg/dL   Calcium 8.6 (L) 8.9 - 10.3 mg/dL   GFR, Estimated >69 >62 mL/min   Anion gap 8 5 - 15   Imaging Studies: No results found.  ED COURSE and MDM  Nursing notes,  initial and subsequent vitals signs, including pulse oximetry, reviewed and interpreted by myself.  Vitals:   07/23/20 2233 07/24/20 0000 07/24/20 0145  BP: (!) 167/94 137/79 117/69  Pulse: 78 65 (!) 58  Resp: 20 18 19   Temp: 98.7 F (37.1 C)    TempSrc: Oral    SpO2: 100% 100% 98%  Weight: 88.9  kg    Height: 5\' 4"  (1.626 m)     Medications - No data to display  1:52 AM The patient is now asymptomatic.  Her laboratory studies and EKG are unremarkable.  Differential diagnosis includes a tachyarrhythmia such as SVT or panic attack.  She has no documented history of panic attacks.  We will refer her back to her PCP for further evaluation.  PROCEDURES  Procedures   ED DIAGNOSES     ICD-10-CM   1. Panic attack  F41.0        Nollan Muldrow, , MD 07/24/20 [phone]

## 2020-07-31 ENCOUNTER — Ambulatory Visit: Payer: Self-pay | Admitting: *Deleted

## 2020-07-31 NOTE — Telephone Encounter (Signed)
C/o heart racing at times and breathing difficulties. Patient is experiencing dizziness during episodes of heart racing and feels like "can't feel air going in or out". Pain in top of head at times. Patient denies chest pain or sweating. Has been seen in ED on 07/23/20 for heart racing. Virtual appt scheduled for 08/11/20. No earlier OV available sooner than 08/11/20. Patient and her family are requesting in office visit. Care advise given. Patient and family verbalized understanding of care advise and to call back or go to ED if symptoms worsen.   Reason for Disposition . [1] Palpitations AND [2] no improvement after using CARE ADVICE  Answer Assessment - Initial Assessment Questions 1. DESCRIPTION: "Please describe your heart rate or heartbeat that you are having" (e.g., fast/slow, regular/irregular, skipped or extra beats, "palpitations")     Heart racing at times  2. ONSET: "When did it start?" (Minutes, hours or days)      2 weeks ago  3. DURATION: "How long does it last" (e.g., seconds, minutes, hours)     approximately 5 minutes or less 4. PATTERN "Does it come and go, or has it been constant since it started?"  "Does it get worse with exertion?"   "Are you feeling it now?"     Comes and goes. Not feeling heart racing or breathing difficulties now  5. TAP: "Using your hand, can you tap out what you are feeling on a chair or table in front of you, so that I can hear?" (Note: not all patients can do this)       na 6. HEART RATE: "Can you tell me your heart rate?" "How many beats in 15 seconds?"  (Note: not all patients can do this)       No  7. RECURRENT SYMPTOM: "Have you ever had this before?" If Yes, ask: "When was the last time?" and "What happened that time?"      Yes happens atleast once a week now  8. CAUSE: "What do you think is causing the palpitations?"     Went to ED and reports panic attacks 9. CARDIAC HISTORY: "Do you have any history of heart disease?" (e.g., heart attack,  angina, bypass surgery, angioplasty, arrhythmia)      na 10. OTHER SYMPTOMS: "Do you have any other symptoms?" (e.g., dizziness, chest pain, sweating, difficulty breathing)       Dizziness, breathing difficulty , can't feel air going in sometimes.  11. PREGNANCY: "Is there any chance you are pregnant?" "When was your last menstrual period?"       na  Protocols used: HEART RATE AND HEARTBEAT QUESTIONS-A-AH

## 2020-08-01 NOTE — Telephone Encounter (Signed)
Appt has been changed to OV. Will make pt aware

## 2020-08-02 ENCOUNTER — Other Ambulatory Visit: Payer: Self-pay | Admitting: Internal Medicine

## 2020-08-02 DIAGNOSIS — G8929 Other chronic pain: Secondary | ICD-10-CM

## 2020-08-02 DIAGNOSIS — M545 Low back pain, unspecified: Secondary | ICD-10-CM

## 2020-08-10 ENCOUNTER — Ambulatory Visit (INDEPENDENT_AMBULATORY_CARE_PROVIDER_SITE_OTHER): Payer: 59 | Admitting: Plastic Surgery

## 2020-08-10 ENCOUNTER — Encounter: Payer: Self-pay | Admitting: Plastic Surgery

## 2020-08-10 ENCOUNTER — Other Ambulatory Visit: Payer: Self-pay

## 2020-08-10 VITALS — BP 124/73 | HR 64 | Temp 98.2°F | Ht 64.0 in | Wt 196.2 lb

## 2020-08-10 DIAGNOSIS — M545 Low back pain, unspecified: Secondary | ICD-10-CM | POA: Diagnosis not present

## 2020-08-10 DIAGNOSIS — M4004 Postural kyphosis, thoracic region: Secondary | ICD-10-CM | POA: Diagnosis not present

## 2020-08-10 DIAGNOSIS — M546 Pain in thoracic spine: Secondary | ICD-10-CM

## 2020-08-10 DIAGNOSIS — N62 Hypertrophy of breast: Secondary | ICD-10-CM | POA: Diagnosis not present

## 2020-08-10 DIAGNOSIS — Z411 Encounter for cosmetic surgery: Secondary | ICD-10-CM | POA: Diagnosis not present

## 2020-08-10 NOTE — Progress Notes (Signed)
Referring Provider Marcine Matar, MD [address] [place],  [place] [address]   CC:  Chief Complaint  Patient presents with  . Advice Only       Denise Arias is an 39 y.o. female.  HPI: Patient presents to discuss breast reduction.  She has had years of back pain, neck pain and shoulder grooving from her large breasts.  She is currently a 40 4D and wants to be a C cup.  She tried over-the-counter medications, warm packs, cold packs and supportive bras with little relief of her pain.  She also gets rashes beneath her breast that have been refractory to over-the-counter medications.  She has gone to physical therapy in the past in the past with little sustained relief.  She has never had a mammogram.  She has not had any previous breast procedures or biopsies.  She is interested in surgical treatment.  She is also interested in discussing her abdomen.  She is bothered by the excess skin and overall appearance and wants to see if it could be improved.  She has not had any prior abdominal surgeries.  Allergies  Allergen Reactions  . Tizanidine     Causes eyes to turn red.  . Tramadol Other (See Comments)    "Heart works fast" Eye become red  . Amoxicillin Nausea And Vomiting  . Banana Hives  . Fish Oil Rash  . Minocycline Rash    Rash per patient report.  . Multivitamins Other (See Comments)    sd her stomach/intestinal area swelled up after 2 hrs.  . Strawberry Extract Rash    Outpatient Encounter Medications as of 08/10/2020  Medication Sig  . meloxicam (MOBIC) 15 MG tablet TAKE 1 TABLET BY MOUTH EVERY DAY (Patient not taking: Reported on 08/10/2020)   No facility-administered encounter medications on file as of 08/10/2020.     Past Medical History:  Diagnosis Date  . Allergy    RHINITIS  . GERD (gastroesophageal reflux disease)     Past Surgical History:  Procedure Laterality Date  . NO PAST SURGERIES    . VAGINAL DELIVERY     X 4    Family History   Problem Relation Age of Onset  . Diabetes Mother   . Hypertension Mother   . Thyroid disease Sister        goiter  . Diabetes Sister   . Other Brother        murdered  . Asthma Maternal Uncle     Social History   Social History Narrative  . Not on file  Denies tobacco use  Review of Systems General: Denies fevers, chills, weight loss CV: Denies chest pain, shortness of breath, palpitations  Physical Exam Vitals with BMI 08/10/2020 07/24/2020 07/24/2020  Height 5\' 4"  - -  Weight 196 lbs 3 oz - -  BMI 33.66 - -  Systolic 124 117  Diastolic 73 69 79  Pulse 64 58 65    General:  No acute distress,  Alert and oriented, Non-Toxic, Normal speech and affect Breast: She has grade 3 ptosis.  Sternal notch to nipple is 32 cm on the right and 34 cm on the left.  Nipple to fold is 18 cm on the right and 16 cm on the left.  I do not see any obvious scars or masses. Abdomen: Abdomen is soft nontender.  I do not appreciate any hernias.  She has moderate skin and soft tissue excess and a small abdominal pannus.  Assessment/Plan The patient has bilateral symptomatic macromastia.  She is a good candidate for a breast reduction.  She is interested in pursuing surgical treatment.  She has tried supportive garments and fitted bras with no relief.  The details of breast reduction surgery were discussed.  I explained the procedure in detail along the with the expected scars.  The risks were discussed in detail and include bleeding, infection, damage to surrounding structures, need for additional procedures, nipple loss, change in nipple sensation, persistent pain, contour irregularities and asymmetries.  I explained that breast feeding is often not possible after breast reduction surgery.  We discussed the expected postoperative course with an overall recovery period of about 1 month.  She demonstrated full understanding of all risks.  We discussed her personal risk factors.  I anticipate  approximately 630g of tissue removed from each side.  For the abdomen I think she is a good candidate for abdominoplasty.  I explained the details of this procedure to her.  I explained I would do liposuction and also a rectus plication.  I explained I would excise all extra skin and soft tissue in her scar would be transverse extending hip to hip.  I explained the expected recovery and general postoperative expectations.  I explained doing this catheter with a breast reduction would make the recovery more difficult but ultimately I think she is a reasonable candidate for.  She is getting consider this and will plan to move forward with insurance approval for the breast reduction.   Denise Arias 08/10/2020, 4:25 PM

## 2020-08-11 ENCOUNTER — Ambulatory Visit: Payer: 59 | Attending: Internal Medicine | Admitting: Internal Medicine

## 2020-08-11 ENCOUNTER — Encounter: Payer: Self-pay | Admitting: Internal Medicine

## 2020-08-11 VITALS — BP 122/80 | HR 68 | Resp 16 | Wt 196.4 lb

## 2020-08-11 DIAGNOSIS — D649 Anemia, unspecified: Secondary | ICD-10-CM

## 2020-08-11 DIAGNOSIS — F410 Panic disorder [episodic paroxysmal anxiety] without agoraphobia: Secondary | ICD-10-CM

## 2020-08-11 DIAGNOSIS — R002 Palpitations: Secondary | ICD-10-CM | POA: Diagnosis not present

## 2020-08-11 NOTE — Patient Instructions (Signed)
Panic Attack  A panic attack is when you suddenly feel very afraid, uncomfortable, or nervous (anxious). A panic attack can happen when you are scared or for no reason. A panic attack can feel like a serious problem. It can even feel like a heart attack or stroke. See your doctor when you have a panic attack to make sure you do not have a serious problem. Follow these instructions at home:  Take medicines only as told by your doctor.  If you feel worried or nervous, try not to have caffeine.  Take good care of your health. To do this: ? Eat healthy. Make sure to eat fresh fruits and vegetables, whole grains, lean meats, and low-fat dairy. ? Get enough sleep. Try to sleep for 7-8 hours each night. ? Exercise. Try to be active for 30 minutes 5 or more days a week. ? Do not smoke. Talk to your doctor if you need help quitting. ? Limit how much alcohol you drink:  If you are a woman who is not pregnant: try not to have more than 1 drink a day.  If you are a man: try not to have more than 2 drinks a day.  One drink equals 12 oz of beer, 5 oz of wine, or 1 oz of hard liquor.  Keep all follow-up visits as told by your doctor. This is important. Contact a doctor if:  Your symptoms do not get better.  Your symptoms get worse.  You are not able to take your medicines as told. Get help right away if:  You have thoughts of hurting yourself or others.  You have symptoms of a panic attack. Do not drive yourself to the hospital. Have someone else drive you or call an ambulance. If you feel like you may hurt yourself or others, or have thoughts about taking your own life, get help right away. You can go to your nearest emergency department or call:  Your local emergency services (911 in the [place]  A suicide crisis helpline, such as the National Suicide Prevention Lifeline at 1-800-273-8255. This is open 24 hours a day. Summary  A panic attack is when you suddenly feel very afraid,  uncomfortable, or nervous (anxious).  See your doctor when you have a panic attack to make sure that you do not have another serious problem.  If you feel like you may hurt yourself or others, get help right away by calling 911. This information is not intended to replace advice given to you by your health care provider. Make sure you discuss any questions you have with your health care provider. Document Revised: 08/01/2017 Document Reviewed: 10/02/2016 Elsevier Patient Education  2020 Elsevier Inc.  
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
## 2020-08-15 ENCOUNTER — Ambulatory Visit: Payer: 59 | Attending: Internal Medicine

## 2020-08-15 ENCOUNTER — Other Ambulatory Visit: Payer: Self-pay

## 2020-08-15 DIAGNOSIS — D649 Anemia, unspecified: Secondary | ICD-10-CM

## 2020-08-15 DIAGNOSIS — R002 Palpitations: Secondary | ICD-10-CM

## 2020-08-16 ENCOUNTER — Other Ambulatory Visit: Payer: Self-pay | Admitting: Internal Medicine

## 2020-08-16 ENCOUNTER — Other Ambulatory Visit: Payer: Self-pay

## 2020-08-16 DIAGNOSIS — D638 Anemia in other chronic diseases classified elsewhere: Secondary | ICD-10-CM

## 2020-08-16 DIAGNOSIS — R002 Palpitations: Secondary | ICD-10-CM

## 2020-08-16 LAB — IRON,TIBC AND FERRITIN PANEL
Ferritin: 33 ng/mL (ref 15–150)
Iron Saturation: 21 % (ref 15–55)
Iron: 48 ug/dL (ref 27–159)
Total Iron Binding Capacity: 229 ug/dL — ABNORMAL LOW (ref 250–450)
UIBC: 181 ug/dL (ref 131–425)

## 2020-08-16 LAB — TSH: TSH: 0.399 u[IU]/mL — ABNORMAL LOW (ref 0.450–4.500)

## 2020-08-16 MED ORDER — FERROUS SULFATE 325 (65 FE) MG PO TABS: 325.0000 mg | ORAL_TABLET | Freq: Every day | ORAL | 0 refills | Status: DC

## 2020-08-16 NOTE — Addendum Note (Signed)
Addended by: Jonah Blue B on: 08/16/2020 10:15 AM   Modules accepted: Orders

## 2020-08-16 NOTE — Addendum Note (Signed)
Addended by: Jonah Blue B on: 08/16/2020 02:44 PM   Modules accepted: Orders

## 2020-08-16 NOTE — Progress Notes (Signed)
Let patient know that she has a mild anemia.  I recommend taking an iron supplement daily.  I will send a prescription to her pharmacy. 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  Over functioning thyroid can cause palpitations and tremors.  I have asked the lab to do additional testing on the blood already drawn to confirm diagnosis.  Once confirmed, we can start her on medication for over functioning thyroid.  I will get back to her in a few days once I get the additional results.

## 2020-08-20 ENCOUNTER — Other Ambulatory Visit: Payer: Self-pay | Admitting: Internal Medicine

## 2020-08-20 DIAGNOSIS — R002 Palpitations: Secondary | ICD-10-CM

## 2020-08-20 NOTE — Progress Notes (Signed)
Let patient know that initial thyroid hormone level was abnormal but additional testing reveals secondary thyroid hormone levels normal.  I would like to repeat levels in about 4-6 weeks.  She has a mild anemia.  I have sent a prescription for iron supplement to her pharmacy.

## 2020-08-22 NOTE — Progress Notes (Signed)
Called pt unable to reach left VM. 

## 2020-08-23 ENCOUNTER — Telehealth: Payer: Self-pay

## 2020-08-23 NOTE — Telephone Encounter (Signed)
Contacted pt to go over lab results pt didn't answer lvm  

## 2020-08-24 ENCOUNTER — Telehealth: Payer: Self-pay | Admitting: Plastic Surgery

## 2020-08-24 ENCOUNTER — Ambulatory Visit: Payer: 59 | Attending: Internal Medicine | Admitting: Licensed Clinical Social Worker

## 2020-08-24 ENCOUNTER — Other Ambulatory Visit: Payer: Self-pay

## 2020-08-24 DIAGNOSIS — F411 Generalized anxiety disorder: Secondary | ICD-10-CM | POA: Diagnosis not present

## 2020-08-24 NOTE — Telephone Encounter (Signed)
Call to [facility], [facility] CPT code 11021 does not need a PA, per Marney Setting 815 central time on 08/24/2020.

## 2020-08-29 NOTE — Telephone Encounter (Signed)
Patient would like a call back to go over her lab results please. Can be reached at Ph# [phone]

## 2020-08-30 ENCOUNTER — Other Ambulatory Visit: Payer: Self-pay | Admitting: Plastic Surgery

## 2020-08-30 DIAGNOSIS — N62 Hypertrophy of breast: Secondary | ICD-10-CM

## 2020-09-05 NOTE — BH Specialist Note (Signed)
Integrated Behavioral Health Initial In-Person Visit  MRN: [id number] Name: Denise Arias  Number of Integrated Behavioral Health Clinician visits:: 1/6 Session Start time: 4:10 PM  Session End time: 4:40 PM Total time: 30 minutes  Types of Service: Individual psychotherapy  Interpretor:No. Interpretor Name and Language: NA   Subjective: Denise Arias is a 40 y.o. female accompanied by Spouse Patient was referred by Dr. Laural Benes for anxiety. Patient reports the following symptoms/concerns: Pt reports difficulty managing symptoms of anxiety, including fear that something bad will happen, heart palpitations, headaches, and warming in belly Duration of problem: October 2021; Severity of problem: moderate  Objective: Mood: Anxious and Affect: Appropriate Risk of harm to self or others: No plan to harm self or others  Life Context: Family and Social: Pt receives strong support from family School/Work: No financial concerns noted Self-Care: Pt denies substance use hx. She reports being intentional about eating more vegetables and fruits Life Changes: Pt reports increase in anxiety symptoms after receiving vaccinations and a negative event with a local woman  Patient and/or Family's Strengths/Protective Factors: Social connections, Social and Emotional competence, Concrete supports in place (healthy food, safe environments, etc.) and Sense of purpose  Goals Addressed: Patient will: 1. Increase knowledge and/or ability of: coping skills Pt agreed to utilize grounding strategies identified in session to assist with symptoms.   Progress towards Goals: Ongoing  Interventions: Interventions utilized: Mindfulness or Management consultant, Supportive Counseling and Psychoeducation and/or Health Education  Standardized Assessments completed: GAD-7 and PHQ 2&9  Patient Response: Pt was engaged during session and was successful in identifying strategies to assist with symptoms  Patient  Centered Plan: Patient is on the following Treatment Plan(s):  Anxiety  Assessment: Patient currently experiencing difficulty managing anxiety symptoms triggered after a negative event with a local woman, in addition, to receiving vaccinations.   Patient may benefit from utilizing grounding strategies to assist with management and/or decrease of symptoms.  Plan: 1. Follow up with behavioral health clinician on : Pt agreed to schedule follow up appointment with LCSW should symptoms increase 2. Behavioral recommendations: Utilize strategies discussed 3. Referral(s): Integrated Behavioral Health Services (In Clinic) 4. "From scale of 1-10, how likely are you to follow plan?":   Bridgett Larsson, LCSW 09/05/20 3:00 PM

## 2020-09-08 LAB — SPECIMEN STATUS REPORT

## 2020-09-08 LAB — T3, FREE: T3, Free: 2.9 pg/mL (ref 2.0–4.4)

## 2020-09-08 LAB — T4, FREE: Free T4: 1.23 ng/dL (ref 0.82–1.77)

## 2020-09-13 ENCOUNTER — Telehealth (INDEPENDENT_AMBULATORY_CARE_PROVIDER_SITE_OTHER): Payer: Self-pay

## 2020-09-13 MED ORDER — TRUEPLUS LANCETS 28G MISC: 2 refills | Status: DC

## 2020-09-13 MED ORDER — GLUCOSE BLOOD VI STRP: ORAL_STRIP | 2 refills | Status: DC

## 2020-09-13 MED ORDER — TRUE METRIX METER W/DEVICE KIT: PACK | 0 refills | Status: DC

## 2020-09-13 NOTE — Telephone Encounter (Signed)
Insurance will likely not cover this without a dx of diabetes. I will send in a rx but she will likely have to purchase a meter OTC if she desires to check her BG.

## 2020-09-13 NOTE — Telephone Encounter (Signed)
Would you be able to send in. Pt had an A1C done 02/21/20 and it was 6.1. Pt is not on any dm medications.

## 2020-09-13 NOTE — Telephone Encounter (Signed)
Copied from CRM [id number]. Topic: General - Other >> Sep 12, 2020  3:54 PM Dalphine Handing A wrote: Patient wants to know if Dr. Laural Benes can place an order for patient to get a blood glucose monitior. Please advise

## 2020-09-14 ENCOUNTER — Other Ambulatory Visit: Payer: Self-pay | Admitting: Internal Medicine

## 2020-09-15 ENCOUNTER — Emergency Department (HOSPITAL_COMMUNITY)
Admission: EM | Admit: 2020-09-15 | Discharge: 2020-09-16 | Disposition: A | Discharge status: Home / Self Care | Payer: 59 | Attending: Emergency Medicine | Admitting: Emergency Medicine

## 2020-09-15 ENCOUNTER — Other Ambulatory Visit: Payer: Self-pay

## 2020-09-15 ENCOUNTER — Encounter (HOSPITAL_COMMUNITY): Payer: Self-pay | Admitting: Emergency Medicine

## 2020-09-15 DIAGNOSIS — F419 Anxiety disorder, unspecified: Secondary | ICD-10-CM

## 2020-09-15 DIAGNOSIS — R42 Dizziness and giddiness: Secondary | ICD-10-CM | POA: Insufficient documentation

## 2020-09-15 DIAGNOSIS — T50Z95D Adverse effect of other vaccines and biological substances, subsequent encounter: Secondary | ICD-10-CM

## 2020-09-15 DIAGNOSIS — F32A Depression, unspecified: Secondary | ICD-10-CM | POA: Diagnosis not present

## 2020-09-15 LAB — URINALYSIS, ROUTINE W REFLEX MICROSCOPIC
Bilirubin Urine: NEGATIVE
Glucose, UA: NEGATIVE mg/dL
Hgb urine dipstick: NEGATIVE
Ketones, ur: NEGATIVE mg/dL
Leukocytes,Ua: NEGATIVE
Nitrite: NEGATIVE
Protein, ur: NEGATIVE mg/dL
Specific Gravity, Urine: 1.003 — ABNORMAL LOW (ref 1.005–1.030)
pH: 6 (ref 5.0–8.0)

## 2020-09-15 LAB — CBC
HCT: 41.1 % (ref 36.0–46.0)
Hemoglobin: 13.3 g/dL (ref 12.0–15.0)
MCH: 28.4 pg (ref 26.0–34.0)
MCHC: 32.4 g/dL (ref 30.0–36.0)
MCV: 87.6 fL (ref 80.0–100.0)
Platelets: 273 10*3/uL (ref 150–400)
RBC: 4.69 MIL/uL (ref 3.87–5.11)
RDW: 13.2 % (ref 11.5–15.5)
WBC: 9.1 10*3/uL (ref 4.0–10.5)
nRBC: 0 % (ref 0.0–0.2)

## 2020-09-15 LAB — BASIC METABOLIC PANEL
Anion gap: 11 (ref 5–15)
BUN: 14 mg/dL (ref 6–20)
CO2: 22 mmol/L (ref 22–32)
Calcium: 9.6 mg/dL (ref 8.9–10.3)
Chloride: 104 mmol/L (ref 98–111)
Creatinine, Ser: 0.65 mg/dL (ref 0.44–1.00)
GFR, Estimated: >60 mL/min (ref >60)
Glucose, Bld: 101 mg/dL — ABNORMAL HIGH (ref 70–99)
Potassium: 3.9 mmol/L (ref 3.5–5.1)
Sodium: 137 mmol/L (ref 135–145)

## 2020-09-15 LAB — CBG MONITORING, ED: Glucose-Capillary: 96 mg/dL (ref 70–99)

## 2020-09-15 NOTE — ED Triage Notes (Signed)
Patient states that she was recently diagnosed with anxiety and anemia. Patient states she began having these symptoms since her vaccine in October. Patient states she does not believe it is anxiety. Patient has been prescribed ferrous sulfate and has been taking it. Patient having anxiety symptoms, including feeling scared and sad.

## 2020-09-16 LAB — HCG, QUANTITATIVE, PREGNANCY: hCG, Beta Chain, Quant, S: <1 m[IU]/mL (ref <5)

## 2020-09-16 MED ORDER — MECLIZINE HCL 25 MG PO TABS: ORAL_TABLET | ORAL | 0 refills | Status: DC

## 2020-09-16 NOTE — Discharge Instructions (Addendum)
Take the meclizine for the feeling of  spinning.  Please keep your appointment with your primary care doctor.  Either your primary care doctor or a psychiatrist will need to decide if you need medication for your feelings of anxiety and depression.  Return to the emergency department if you feel like you are going to do something to harm yourself or harm somebody else.  You may consider seeing another therapist, look at the information I gave you to help find 1 or even a psychiatrist.  A psychiatrist is able to prescribe medications, a therapist cannot prescribe medications.

## 2020-09-16 NOTE — ED Notes (Signed)
Patient refused vital signs on discharge. 

## 2020-09-16 NOTE — ED Provider Notes (Signed)
[facility] DEPT Provider Note   CSN: [id number] Arrival date & time: 09/15/20  2131   Time seen 2:00 AM  History Chief Complaint  Patient presents with  . Anxiety    Denise Arias is a 40 y.o. female.  HPI   Patient states she felt bad after her first [place] vaccine in September.  She states she felt like something was running from her feet up into her head and she felt like everything was going fast around her.  She states she got her second [place] COVID-vaccine on October 5.  She states for 2 to 3 weeks after that she felt hot and she was having a headache on top of her head.  She states it felt different than her regular headache.  She states sometimes she has episodes where she feels like she is spinning around and it will last less than a minute.  She also sometimes feels like her heart is beating fast and she feels scared and she feels like she is going to pass out.  She does not have diaphoresis or chest pain..  She is very obsessed with her blood sugar.  She has 2 glucose monitors at home and she is concerned because they are giving different readings.  They are about 15 points difference.  She states she feels depressed but she is not suicidal.  She is not homicidal.  She has this overwhelming feeling every day that she is going to die.  She has been seen by therapist in December and states that she has been waiting for the therapist to call her back.  She has a follow-up appointment of her primary care doctor on February 1.  PCP Ladell Pier, MD   Past Medical History:  Diagnosis Date  . Allergy    RHINITIS  . GERD (gastroesophageal reflux disease)     Patient Active Problem List   Diagnosis Date Noted  . Anemia, chronic disease 08/16/2020  . Influenza vaccination declined 07/10/2020  . Abdominal obesity 07/10/2020  . Obesity (BMI 30.0-34.9) 07/10/2020  . Adverse food reaction 05/05/2020  . Other allergic rhinitis 05/05/2020  .  Vaccine counseling 05/05/2020  . Allergic conjunctivitis of both eyes 05/05/2020  . Prediabetes 04/11/2020  . 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 Class 2 obesity due to excess calories without serious comorbidity with body mass index (BMI) of 35.0 to 35.9 in adult 02/21/2020  . Drug reaction 03/03/2017  . Frequent headaches 11/15/2016  . Palpitations 11/15/2016  . Family history of thyroid disease in sister 11/15/2016  . 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 NO PAST SURGERIES    . VAGINAL DELIVERY     X 4     OB History    Gravida  5   Para  5   Term  5   Preterm      AB      Living  5     SAB      IAB      Ectopic      Multiple  0   Live Births  1           Family History  Problem Relation Age of Onset  . Diabetes Mother   . Hypertension Mother   . Thyroid disease Sister        goiter  . Diabetes Sister   . Other Brother        murdered  . Asthma  Maternal Uncle     Social History   Tobacco Use  . Smoking status: Never Smoker  . Smokeless tobacco: Never Used  Vaping Use  . Vaping Use: Never used  Substance Use Topics  . Alcohol use: No  . Drug use: No  lives with spouse  Home Medications Prior to Admission medications   Medication Sig Start Date End Date Taking? Authorizing Provider  meclizine (ANTIVERT) 25 MG tablet Take 1 or 2 po Q 6hrs for spinning sensation 09/16/20  Yes Rolland Porter, MD  Blood Glucose Monitoring Suppl (TRUE METRIX METER) w/Device KIT Use to check BG once daily. 09/13/20   Ladell Pier, MD  ferrous sulfate 325 (65 FE) MG tablet TAKE 1 TABLET BY MOUTH EVERY DAY WITH BREAKFAST 09/14/20   Ladell Pier, MD  glucose blood test strip Use to check BG once daily. 09/13/20   Ladell Pier, MD  meloxicam (MOBIC) 15 MG tablet TAKE 1 TABLET BY MOUTH EVERY DAY Patient not taking: Reported on 08/10/2020 08/02/20   Ladell Pier, MD  TRUEplus Lancets 28G MISC Use to  check BG once daily. 09/13/20   Ladell Pier, MD    Allergies    Tizanidine, Tramadol, Amoxicillin, Banana, Fish oil, Minocycline, Multivitamins, and Strawberry extract  Review of Systems   Review of Systems  All other systems reviewed and are negative.   Physical Exam Updated Vital Signs BP 139/88 (BP Location: Left Arm)   Pulse 76   Temp 98.4 F (36.9 C) (Oral)   Resp 16   SpO2 100%   Physical Exam Vitals and nursing note reviewed.  Constitutional:      Appearance: Normal appearance. She is obese.  HENT:     Head: Normocephalic and atraumatic.  Eyes:     Extraocular Movements: Extraocular movements intact.     Conjunctiva/sclera: Conjunctivae normal.  Cardiovascular:     Rate and Rhythm: Normal rate.  Pulmonary:     Effort: Pulmonary effort is normal.  Musculoskeletal:     Cervical back: Normal range of motion.  Skin:    General: Skin is warm and dry.  Neurological:     General: No focal deficit present.     Mental Status: She is alert and oriented to person, place, and time.     Cranial Nerves: No cranial nerve deficit.  Psychiatric:        Mood and Affect: Affect is flat.        Speech: Speech normal.        Behavior: Behavior normal.        Thought Content: Thought content does not include homicidal or suicidal ideation.     ED Results / Procedures / Treatments   Labs (all labs ordered are listed, but only abnormal results are displayed) Results for orders placed or performed during the hospital encounter of 33/82/50  Basic metabolic panel  Result Value Ref Range   Sodium 137 135 - 145 mmol/L   Potassium 3.9 3.5 - 5.1 mmol/L   Chloride 104 98 - 111 mmol/L   CO2 22 22 - 32 mmol/L   Glucose, Bld 101 (H) 70 - 99 mg/dL   BUN 14 6 - 20 mg/dL   Creatinine, Ser 0.65 0.44 - 1.00 mg/dL   Calcium 9.6 8.9 - 10.3 mg/dL   GFR, Estimated >60 >60 mL/min   Anion gap 11 5 - 15  CBC  Result Value Ref Range   WBC 9.1 4.0 - 10.5 K/uL   RBC 4.69  3.87 - 5.11  MIL/uL   Hemoglobin 13.3 12.0 - 15.0 g/dL   HCT 41.1 36.0 - 46.0 %   MCV 87.6 80.0 - 100.0 fL   MCH 28.4 26.0 - 34.0 pg   MCHC 32.4 30.0 - 36.0 g/dL   RDW 13.2 11.5 - 15.5 %   Platelets 273 150 - 400 K/uL   nRBC 0.0 0.0 - 0.2 %  Urinalysis, Routine w reflex microscopic  Result Value Ref Range   Color, Urine COLORLESS (A) YELLOW   APPearance CLEAR CLEAR   Specific Gravity, Urine 1.003 (L) 1.005 - 1.030   pH 6.0 5.0 - 8.0   Glucose, UA NEGATIVE NEGATIVE mg/dL   Hgb urine dipstick NEGATIVE NEGATIVE   Bilirubin Urine NEGATIVE NEGATIVE   Ketones, ur NEGATIVE NEGATIVE mg/dL   Protein, ur NEGATIVE NEGATIVE mg/dL   Nitrite NEGATIVE NEGATIVE   Leukocytes,Ua NEGATIVE NEGATIVE  CBG monitoring, ED  Result Value Ref Range   Glucose-Capillary 96 70 - 99 mg/dL   Comment 1 Notify RN    Laboratory interpretation all normal   EKG None  Radiology No results found.  Procedures Procedures (including critical care time)  Medications Ordered in ED Medications - No data to display  ED Course  I have reviewed the triage vital signs and the nursing notes.  Pertinent labs & imaging results that were available during my care of the patient were reviewed by me and considered in my medical decision making (see chart for details).    MDM Rules/Calculators/A&P                          Patient presents with feelings of depression and anxiety and feeling that she is going to die since she got her second [place] COVID-vaccine on October 5.  She has seen a mental health counselor but has not gone back.  She was very obsessed about her anemia, her blood work today shows it has resolved and it was only mild before at 11.5.  She states she does have 2 days when her menses is very heavy.  She states the iron pills make her feel bad and we discussed only taking them when she is having her menses.  She is agreeable to following up with her primary care doctor and even getting a different therapist or  psychiatrist to evaluate her anxiety and depression.  She is currently not suicidal or homicidal.  She was reassured that if these are side effects from her vaccine they should wear off soon.   Final Clinical Impression(s) / ED Diagnoses Final diagnoses:  Anxiety  Vertigo  Side effects of vaccination, subsequent encounter  Depression, unspecified depression type    Rx / DC Orders ED Discharge Orders         Ordered    meclizine (ANTIVERT) 25 MG tablet        09/16/20 0231         Plan discharge  Rolland Porter, MD, Barbette Or, MD 09/16/20 [phone]

## 2020-09-28 ENCOUNTER — Other Ambulatory Visit: Payer: Self-pay | Admitting: Internal Medicine

## 2020-10-03 ENCOUNTER — Other Ambulatory Visit: Payer: Self-pay

## 2020-10-03 ENCOUNTER — Ambulatory Visit: Payer: 59 | Attending: Internal Medicine | Admitting: Internal Medicine

## 2020-10-03 ENCOUNTER — Encounter: Payer: Self-pay | Admitting: Internal Medicine

## 2020-10-03 ENCOUNTER — Other Ambulatory Visit: Payer: 59

## 2020-10-03 VITALS — BP 110/82 | HR 65 | Resp 16 | Wt 193.0 lb

## 2020-10-03 DIAGNOSIS — F410 Panic disorder [episodic paroxysmal anxiety] without agoraphobia: Secondary | ICD-10-CM

## 2020-10-03 DIAGNOSIS — H538 Other visual disturbances: Secondary | ICD-10-CM | POA: Diagnosis not present

## 2020-10-03 DIAGNOSIS — R946 Abnormal results of thyroid function studies: Secondary | ICD-10-CM | POA: Diagnosis not present

## 2020-10-03 DIAGNOSIS — R030 Elevated blood-pressure reading, without diagnosis of hypertension: Secondary | ICD-10-CM | POA: Diagnosis not present

## 2020-10-03 NOTE — Progress Notes (Signed)
Pt states she would like her vitamin levels checked  Pt states she has discomfort in her stomach only when touches it   Pt states she want to speak with provider about tinnitus
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
## 2020-10-04 ENCOUNTER — Telehealth: Payer: Self-pay

## 2020-10-04 LAB — TSH+T4F+T3FREE
Free T4: 1.28 ng/dL (ref 0.82–1.77)
T3, Free: 3.2 pg/mL (ref 2.0–4.4)
TSH: 0.46 u[IU]/mL (ref 0.450–4.500)

## 2020-10-04 NOTE — Telephone Encounter (Signed)
Contacted pt to go over lab results pt is aware and doesn't have any questions or concerns 

## 2020-10-10 ENCOUNTER — Other Ambulatory Visit: Payer: Self-pay

## 2020-10-10 ENCOUNTER — Ambulatory Visit
Admission: RE | Admit: 2020-10-10 | Discharge: 2020-10-10 | Disposition: A | Discharge status: Home / Self Care | Payer: 59 | Source: Ambulatory Visit | Attending: Plastic Surgery | Admitting: Plastic Surgery

## 2020-10-10 DIAGNOSIS — N62 Hypertrophy of breast: Secondary | ICD-10-CM

## 2020-10-13 ENCOUNTER — Other Ambulatory Visit: Payer: Self-pay

## 2020-10-13 ENCOUNTER — Ambulatory Visit (HOSPITAL_COMMUNITY)
Admission: EM | Admit: 2020-10-13 | Discharge: 2020-10-13 | Disposition: A | Discharge status: Home / Self Care | Payer: 59

## 2020-10-13 ENCOUNTER — Emergency Department (HOSPITAL_COMMUNITY)
Admission: EM | Admit: 2020-10-13 | Discharge: 2020-10-14 | Disposition: A | Discharge status: Home / Self Care | Payer: 59 | Attending: Emergency Medicine | Admitting: Emergency Medicine

## 2020-10-13 DIAGNOSIS — R519 Headache, unspecified: Secondary | ICD-10-CM | POA: Diagnosis not present

## 2020-10-13 DIAGNOSIS — G4452 New daily persistent headache (NDPH): Secondary | ICD-10-CM

## 2020-10-13 NOTE — ED Triage Notes (Signed)
Pt presents to ED POV. Pt c/o her heart and head pounding. Pt reports that pt she has been having s/s since her vaccine in 07/2021. Reports that tylenol helps w/ all her other pain but her HA continues.

## 2020-10-14 ENCOUNTER — Emergency Department (HOSPITAL_COMMUNITY): Payer: 59

## 2020-10-14 ENCOUNTER — Encounter (HOSPITAL_COMMUNITY): Payer: Self-pay | Admitting: Student

## 2020-10-14 MED ORDER — SODIUM CHLORIDE 0.9 % IV BOLUS: 500.0000 mL | Freq: Once | INTRAVENOUS | Status: DC

## 2020-10-14 MED ORDER — PROCHLORPERAZINE EDISYLATE 10 MG/2ML IJ SOLN: 10.0000 mg | Freq: Once | INTRAMUSCULAR | Status: DC

## 2020-10-14 MED ORDER — DIPHENHYDRAMINE HCL 50 MG/ML IJ SOLN: 12.5000 mg | Freq: Once | INTRAMUSCULAR | Status: DC

## 2020-10-14 NOTE — Discharge Instructions (Addendum)
You were seen in the emergency department today for a headache/abnormal sensation to the head.  Your CT scan did not show any significant abnormalities.  We have sent a referral to neurology for close follow-up, they will call you to schedule this appointment and we have also provided their office information.  Please take Tylenol and or Motrin per over-the-counter dosing to help with discomfort.  Please follow-up with your primary care provider as well as neurology within the next 1 week.  Return to the ER for new or worsening symptoms including but not limited to sudden onset pain, sudden change in pain, new headache, vomiting, inability to keep fluids down, change in your vision, numbness, weakness, dizziness, passing out, fever, neck stiffness, or any other concerns.

## 2020-10-14 NOTE — ED Notes (Signed)
Pt states she is not having pain at this time and wants to wait to get medications and IV  Provider aware

## 2020-10-14 NOTE — ED Provider Notes (Signed)
[facility] EMERGENCY DEPARTMENT Provider Note   CSN: [id number] Arrival date & time: 10/13/20  1652     History Chief Complaint  Patient presents with  . Headache    Denise Arias is a 40 y.o. female with a hx of GERD, frequent headaches, palpitations, & anemia who presents to the ED with complaints of headache since 09/01/2020.  Patient states she has had this abnormal sensation/pressure to the top of her head since New Year's Eve, it is constant, occurs daily, seems better in the morning and worse in the evening.  No specific alleviating or aggravating factors.  She has tried Tylenol without much relief.  Has not had headaches like this in the past.  No sudden worsening or sudden change per her report.  She denies fever, chills, neck stiffness, visual disturbance, numbness, weakness, syncope, or head injury.  She also denies any chest pain, dyspnea, or palpitations despite triage note mentioning heart pounding.  HPI     Past Medical History:  Diagnosis Date  . Allergy    RHINITIS  . GERD (gastroesophageal reflux disease)     Patient Active Problem List   Diagnosis Date Noted  . Anemia, chronic disease 08/16/2020  . Influenza vaccination declined 07/10/2020  . Abdominal obesity 07/10/2020  . Obesity (BMI 30.0-34.9) 07/10/2020  . Adverse food reaction 05/05/2020  . Other allergic rhinitis 05/05/2020  . Vaccine counseling 05/05/2020  . Allergic conjunctivitis of both eyes 05/05/2020  . Prediabetes 04/11/2020  . 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 Class 2 obesity due to excess calories without serious comorbidity with body mass index (BMI) of 35.0 to 35.9 in adult 02/21/2020  . Drug reaction 03/03/2017  . Frequent headaches 11/15/2016  . Palpitations 11/15/2016  . Family history of thyroid disease in sister 11/15/2016  . 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 NO PAST SURGERIES    . VAGINAL DELIVERY     X  4     OB History    Gravida  5   Para  5   Term  5   Preterm      AB      Living  5     SAB      IAB      Ectopic      Multiple  0   Live Births  1           Family History  Problem Relation Age of Onset  . Diabetes Mother   . Hypertension Mother   . Thyroid disease Sister        goiter  . Diabetes Sister   . Other Brother        murdered  . Asthma Maternal Uncle     Social History   Tobacco Use  . Smoking status: Never Smoker  . Smokeless tobacco: Never Used  Vaping Use  . Vaping Use: Never used  Substance Use Topics  . Alcohol use: No  . Drug use: No    Home Medications Prior to Admission medications   Medication Sig Start Date End Date Taking? Authorizing Provider  Blood Glucose Monitoring Suppl (TRUE METRIX METER) w/Device KIT Use to check BG once daily. 09/13/20   Ladell Pier, MD  ferrous sulfate 325 (65 FE) MG tablet TAKE 1 TABLET BY MOUTH EVERY DAY WITH BREAKFAST 09/28/20   Ladell Pier, MD  glucose blood test strip Use to check BG once daily. 09/13/20   Wynetta Emery,  Dalbert Batman, MD  meclizine (ANTIVERT) 25 MG tablet Take 1 or 2 po Q 6hrs for spinning sensation 09/16/20   Rolland Porter, MD  meloxicam (MOBIC) 15 MG tablet TAKE 1 TABLET BY MOUTH EVERY DAY Patient not taking: Reported on 08/10/2020 08/02/20   Ladell Pier, MD  TRUEplus Lancets 28G MISC Use to check BG once daily. 09/13/20   Ladell Pier, MD    Allergies    Tizanidine, Tramadol, Amoxicillin, Banana, Fish oil, Minocycline, Multivitamins, and Strawberry extract  Review of Systems   Review of Systems  Constitutional: Negative for chills and fever.  Eyes: Negative for visual disturbance.  Respiratory: Negative for shortness of breath.   Cardiovascular: Negative for chest pain, palpitations and leg swelling.  Gastrointestinal: Negative for abdominal pain, nausea and vomiting.  Musculoskeletal: Negative for neck stiffness.  Neurological: Positive for headaches.  Negative for seizures, syncope, speech difficulty, weakness and numbness.  All other systems reviewed and are negative.   Physical Exam Updated Vital Signs BP 121/64 (BP Location: Left Arm)   Pulse 66   Temp 98.6 F (37 C) (Oral)   Resp 20   SpO2 99%   Physical Exam Vitals and nursing note reviewed.  Constitutional:      General: She is not in acute distress.    Appearance: She is well-developed. She is not toxic-appearing.  HENT:     Head: Normocephalic and atraumatic.     Comments: No visible or palpable scalp lesions or deformities.    Right Ear: Ear canal normal. Tympanic membrane is not perforated, erythematous, retracted or bulging.     Left Ear: Ear canal normal. Tympanic membrane is not perforated, erythematous, retracted or bulging.     Ears:     Comments: No mastoid erythema/swelling/tenderness.     Nose:     Right Sinus: No maxillary sinus tenderness or frontal sinus tenderness.     Left Sinus: No maxillary sinus tenderness or frontal sinus tenderness.     Mouth/Throat:     Pharynx: Uvula midline. No oropharyngeal exudate or posterior oropharyngeal erythema.     Comments: Posterior oropharynx is symmetric appearing. Patient tolerating own secretions without difficulty. No trismus. No drooling. No hot potato voice. No swelling beneath the tongue, submandibular compartment is soft.  Eyes:     General: Vision grossly intact. Gaze aligned appropriately.        Right eye: No discharge.        Left eye: No discharge.     Extraocular Movements: Extraocular movements intact.     Conjunctiva/sclera: Conjunctivae normal.     Pupils: Pupils are equal, round, and reactive to light.     Comments: PERRL. No proptosis.   Cardiovascular:     Rate and Rhythm: Normal rate and regular rhythm.     Heart sounds: No murmur heard.   Pulmonary:     Effort: Pulmonary effort is normal. No respiratory distress.     Breath sounds: Normal breath sounds. No wheezing, rhonchi or rales.   Abdominal:     General: There is no distension.     Palpations: Abdomen is soft.     Tenderness: There is no abdominal tenderness.  Musculoskeletal:     Cervical back: Normal range of motion and neck supple. No edema, erythema, rigidity or crepitus. Normal range of motion.  Lymphadenopathy:     Cervical: No cervical adenopathy.  Skin:    General: Skin is warm and dry.     Findings: No rash.  Neurological:  Mental Status: She is alert.     Comments: Alert. Clear speech. No facial droop. CNIII-XII grossly intact. Bilateral upper and lower extremities' sensation grossly intact. 5/5 symmetric strength with grip strength and with plantar and dorsi flexion bilaterally . Normal finger to nose bilaterally. Negative pronator drift. Negative Romberg sign. Gait is steady and intact.   Psychiatric:        Behavior: Behavior normal.     ED Results / Procedures / Treatments   Labs (all labs ordered are listed, but only abnormal results are displayed) Labs Reviewed - No data to display  EKG None  Radiology CT Head Wo Contrast  Result Date: 10/14/2020 CLINICAL DATA:  her heart and head pounding. Pt reports that pt she has been having s/s since her vaccine in 07/2021. EXAM: CT HEAD WITHOUT CONTRAST TECHNIQUE: Contiguous axial images were obtained from the base of the skull through the vertex without intravenous contrast. COMPARISON:  CT head 06/04/2016 FINDINGS: Brain: No evidence of large-territorial acute infarction. No parenchymal hemorrhage. No mass lesion. No extra-axial collection. No mass effect or midline shift. No hydrocephalus. Basilar cisterns are patent. Vascular: No hyperdense vessel. Skull: No acute fracture or focal lesion. Sinuses/Orbits: Paranasal sinuses and mastoid air cells are clear. The orbits are unremarkable. Other: None. IMPRESSION: No acute intracranial abnormality. Electronically Signed   By: Iven Finn M.D.   On: 10/14/2020 06:00    Procedures Procedures    Medications Ordered in ED Medications - No data to display  ED Course  I have reviewed the triage vital signs and the nursing notes.  Pertinent labs & imaging results that were available during my care of the patient were reviewed by me and considered in my medical decision making (see chart for details).    MDM Rules/Calculators/A&P                         Patient presents with complaint of headache/abnormal sensation to top of her head for the past 1.5 months. Patient is nontoxic appearing with fairly unremarkable vital signs. Reassuring physical exam. No visible or palpable deformities noted to the scalp.  I ordered a head CT which I personally reviewed and interpreted, agree with radiologist impression of no acute abnormalities noted.. Patient's discomfort has been occurring for over 1 month, no sudden changes/sudden onset, she has no focal neurologic deficits, visual disturbance, dizziness, fever, or nuchal rigidity, overall I have a low suspicion for [facility], ICH, ischemic CVA, dural venous sinus thrombosis, acute glaucoma, giant cell arteritis, mass, or meningitis.  In terms of her abnormal sensation she has had labs checked with normal electrolytes and normal TSH since onset of her symptoms therefore do not feel these need repeating she has not had significant change.  I offered her a migraine cocktail, she declined, states she would like to see a neurologist which I feel is reasonable given her persistent discomfort.  Ambulatory referral placed.  Will discharge home at this time. I discussed results, treatment plan, need for follow-up, and return precautions with the patient. Provided opportunity for questions, patient confirmed understanding and is in agreement with plan.    Final Clinical Impression(s) / ED Diagnoses Final diagnoses:  New daily persistent headache    Rx / DC Orders ED Discharge Orders         Ordered    Ambulatory referral to Neurology       Comments: An appointment  is requested in approximately: 1 week   10/14/20 1660  Amaryllis Dyke, PA-C 10/14/20 Seymour, Fenton, DO 10/14/20 [phone]

## 2020-10-19 ENCOUNTER — Telehealth: Payer: Self-pay | Admitting: Internal Medicine

## 2020-10-19 NOTE — Telephone Encounter (Signed)
Referral was placed on 10/03/20. Per Arna Medici note in epic Sent Referral to [facility] ph # [phone] address [address] they will contact the patient to schedule an appointment. Will contact pt tomorrow in regards to referral

## 2020-10-19 NOTE — Telephone Encounter (Signed)
Copied from CRM [id number]. Topic: Referral - Status >> Oct 18, 2020  4:53 PM Marylen Ponto wrote: Reason for CRM: Pt stated she contacted the eye doctor but she was told that they had not received a referral. Pt advised to contact pcp to request referral to be sent. Pt stated she has been waiting for about 2 weeks already and she really needs to get an appt asap. Pt does not have a preference in eye doctors she just don't want to have to wait a long time before she is seen

## 2020-10-20 NOTE — Telephone Encounter (Signed)
Contacted pt and made aware of my note and provided pt the number to call and schedule

## 2020-10-30 NOTE — Progress Notes (Deleted)
Follow Up Note  RE: Denise Arias MRN: [id number] DOB: 01/06/1981 Date of Office Visit: 10/31/2020  Referring provider: Ladell Pier, MD Primary care provider: Ladell Pier, MD  Chief Complaint: No chief complaint on file.  History of Present Illness: I had the pleasure of seeing Denise Arias for a follow up visit at the [facility] on 10/30/2020. She is a 40 y.o. female, who is being followed for adverse drug reaction, allergic rhinoconjunctivitis and adverse food reaction. Her previous allergy office visit was on 05/04/2020 with Dr. Maudie Mercury. Today is a regular follow up visit.  Drug reaction Patient developed hives after taking minocycline and some type of cough medicine from [place]. She is concerned about getting the COVID-19 vaccine due to this.  Discussed with patient that I don't have a test that checks for what drugs a patient is allergic to - this type of testing does not exist.   Continue to avoid minocycline.  Vaccine counseling Discussed the risks and benefits of COVID-19 vaccination.   Patient has no contraindications in her medical history.   Recommend getting the COVID-19 vaccine.   Discussed that our office has been giving injections to our patiens who are concerned about possible allergic reactions afterwards and she can schedule to receive her injection at our office if interested. However, this is not necessary and she does not need component testing either.   Other allergic rhinitis Mild rhino conjunctivitis symptoms with unknown triggers. Takes OTC antihistamines with good benefit. No prior allergy testing.  Today's skin testing showed: Positive to trees, mold and dust mites.  Start environmental control measures as below.  May use over the counter antihistamines such as Zyrtec (cetirizine), Claritin (loratadine), Allegra (fexofenadine), or Xyzal (levocetirizine) daily as needed.  May use olopatadine eye drops 0.2% once a  day as needed for itchy/watery eyes.  Allergic conjunctivitis of both eyes  See assessment and plan as above for allergic rhinitis.   Adverse food reaction Beans and eggs cause increased gas and diarrhea. Fish and strawberries cause pruritus. Interested in testing for foods as well.  Today's skin testing showed: Negative to foods.   Continue to avoid foods that bother you - fish, strawberries, beans, eggs.   For mild symptoms you can take over the counter antihistamines such as Benadryl and monitor symptoms closely. If symptoms worsen or if you have severe symptoms including breathing issues, throat closure, significant swelling, whole body hives, severe diarrhea and vomiting, lightheadedness then seek immediate medical care.  Return in about 6 months (around 11/01/2020).  Assessment and Plan: Denise Arias is a 40 y.o. female with: No problem-specific Assessment & Plan notes found for this encounter.  No follow-ups on file.  No orders of the defined types were placed in this encounter.  Lab Orders  No laboratory test(s) ordered today    Diagnostics: Spirometry:  Tracings reviewed. Her effort: {Blank single:19197::"Good reproducible efforts.","It was hard to get consistent efforts and there is a question as to whether this reflects a maximal maneuver.","Poor effort, data can not be interpreted."} FVC: ***L FEV1: ***L, ***% predicted FEV1/FVC ratio: ***% Interpretation: {Blank single:19197::"Spirometry consistent with mild obstructive disease","Spirometry consistent with moderate obstructive disease","Spirometry consistent with severe obstructive disease","Spirometry consistent with possible restrictive disease","Spirometry consistent with mixed obstructive and restrictive disease","Spirometry uninterpretable due to technique","Spirometry consistent with normal pattern","No overt abnormalities noted given today's efforts"}.  Please see scanned spirometry results for details.  Skin  Testing: {Blank single:19197::"Select foods","Environmental allergy panel","Environmental allergy panel and select  foods","Food allergy panel","None","Deferred due to recent antihistamines use"}. Positive test to: ***. Negative test to: ***.  Results discussed with patient/family.   Medication List:  Current Outpatient Medications  Medication Sig Dispense Refill  . Blood Glucose Monitoring Suppl (TRUE METRIX METER) w/Device KIT Use to check BG once daily. 1 kit 0  . ferrous sulfate 325 (65 FE) MG tablet TAKE 1 TABLET BY MOUTH EVERY DAY WITH BREAKFAST 90 tablet 0  . glucose blood test strip Use to check BG once daily. 100 each 2  . meclizine (ANTIVERT) 25 MG tablet Take 1 or 2 po Q 6hrs for spinning sensation 40 tablet 0  . meloxicam (MOBIC) 15 MG tablet TAKE 1 TABLET BY MOUTH EVERY DAY (Patient not taking: Reported on 08/10/2020) 90 tablet 0  . TRUEplus Lancets 28G MISC Use to check BG once daily. 100 each 2   No current facility-administered medications for this visit.   Allergies: Allergies  Allergen Reactions  . Tizanidine     Causes eyes to turn red.  . Tramadol Other (See Comments)    "Heart works fast" Eye become red  . Amoxicillin Nausea And Vomiting  . Banana Hives  . Fish Oil Rash  . Minocycline Rash and Hives    Rash per patient report.  . Multivitamins Other (See Comments)    sd her stomach/intestinal area swelled up after 2 hrs.  . Strawberry Extract Rash   I reviewed her past medical history, social history, family history, and environmental history and no significant changes have been reported from her previous visit.  Review of Systems  Constitutional: Negative for appetite change, chills, fever and unexpected weight change.  HENT: Negative for congestion and rhinorrhea.   Eyes: Negative for itching.  Respiratory: Negative for cough, chest tightness, shortness of breath and wheezing.   Cardiovascular: Negative for chest pain.  Gastrointestinal: Negative for  abdominal pain.  Genitourinary: Negative for difficulty urinating.  Skin: Negative for rash.  Allergic/Immunologic: Positive for environmental allergies.  Neurological: Negative for headaches.   Objective: There were no vitals taken for this visit. There is no height or weight on file to calculate BMI. Physical Exam Vitals and nursing note reviewed.  Constitutional:      Appearance: Normal appearance. She is well-developed.  HENT:     Head: Normocephalic and atraumatic.     Right Ear: Tympanic membrane and external ear normal.     Left Ear: Tympanic membrane and external ear normal.     Nose: Nose normal.     Mouth/Throat:     Mouth: Mucous membranes are moist.     Pharynx: Oropharynx is clear.  Eyes:     Conjunctiva/sclera: Conjunctivae normal.  Cardiovascular:     Rate and Rhythm: Normal rate and regular rhythm.     Heart sounds: Normal heart sounds. No murmur heard. No friction rub. No gallop.   Pulmonary:     Effort: Pulmonary effort is normal.     Breath sounds: Normal breath sounds. No wheezing, rhonchi or rales.  Musculoskeletal:     Cervical back: Neck supple.  Skin:    General: Skin is warm.     Findings: No rash.  Neurological:     Mental Status: She is alert and oriented to person, place, and time.  Psychiatric:        Behavior: Behavior normal.    Previous notes and tests were reviewed. The plan was reviewed with the patient/family, and all questions/concerned were addressed.  It was my pleasure to see  Denise Arias today and participate in her care. Please feel free to contact me with any questions or concerns.  Sincerely,  Rexene Alberts, DO Allergy & Immunology  [facility] office: [place] office: [phone]

## 2020-10-31 ENCOUNTER — Ambulatory Visit: Payer: Self-pay | Admitting: Allergy

## 2020-10-31 ENCOUNTER — Encounter: Payer: Self-pay | Admitting: Allergy

## 2020-10-31 ENCOUNTER — Ambulatory Visit (INDEPENDENT_AMBULATORY_CARE_PROVIDER_SITE_OTHER): Payer: 59 | Admitting: Allergy

## 2020-10-31 ENCOUNTER — Other Ambulatory Visit: Payer: Self-pay

## 2020-10-31 VITALS — BP 118/80 | HR 71 | Temp 96.4°F | Resp 18 | Ht 65.35 in | Wt 189.1 lb

## 2020-10-31 DIAGNOSIS — H1013 Acute atopic conjunctivitis, bilateral: Secondary | ICD-10-CM

## 2020-10-31 DIAGNOSIS — T50905D Adverse effect of unspecified drugs, medicaments and biological substances, subsequent encounter: Secondary | ICD-10-CM | POA: Diagnosis not present

## 2020-10-31 DIAGNOSIS — J302 Other seasonal allergic rhinitis: Secondary | ICD-10-CM

## 2020-10-31 DIAGNOSIS — H1010 Acute atopic conjunctivitis, unspecified eye: Secondary | ICD-10-CM | POA: Insufficient documentation

## 2020-10-31 DIAGNOSIS — J3089 Other allergic rhinitis: Secondary | ICD-10-CM

## 2020-10-31 DIAGNOSIS — H938X9 Other specified disorders of ear, unspecified ear: Secondary | ICD-10-CM

## 2020-10-31 DIAGNOSIS — T781XXD Other adverse food reactions, not elsewhere classified, subsequent encounter: Secondary | ICD-10-CM

## 2020-10-31 MED ORDER — FLUTICASONE PROPIONATE 50 MCG/ACT NA SUSP: 1.0000 | Freq: Two times a day (BID) | NASAL | 5 refills | Status: DC | PRN

## 2020-10-31 NOTE — Assessment & Plan Note (Signed)
Past history - Mild rhino conjunctivitis symptoms with unknown triggers. 2021 skin testing showed: Positive to trees, mold and dust mites. Interim history - asymptomatic and only takes zyrtec prn.   Continue environmental control measures as below.  May use olopatadine eye drops 0.2% once a day as needed for itchy/watery eyes.  May use over the counter antihistamines such as Zyrtec (cetirizine), Claritin (loratadine), Allegra (fexofenadine), or Xyzal (levocetirizine) daily as needed.

## 2020-10-31 NOTE — Assessment & Plan Note (Signed)
Past history - Patient developed hives after taking minocycline and some type of cough medicine from [place].  Interim history - Tolerated Covid-19 vaccines. Now concerned about Meloxicam and nausea.   She most likely had some irritation from the Meloxicam and the nausea was a side effect and not an allergy.   Continue to avoid minocycline.

## 2020-10-31 NOTE — Progress Notes (Signed)
Follow Up Note  RE: Denise Arias MRN: [id number] DOB: 02-12-1981 Date of Office Visit: 10/31/2020  Referring provider: Ladell Pier, MD Primary care provider: Ladell Pier, MD  Chief Complaint: Allergic Reaction (medication)  History of Present Illness: I had the pleasure of seeing Denise Arias for a follow up visit at the [facility] on 10/31/2020. She is a 40 y.o. female, who is being followed for adverse drug reaction, allergic rhino conjunctivitis and adverse food reaction. Her previous allergy office visit was on 05/04/2020 with Dr. Maudie Mercury. Today is a regular follow up visit.  Drug reactions Patient got Covid-19 vaccine with no issues. She also noted some nausea after taking Meloxicam.   Allergic rhino conjunctivitis  Currently asymptomatic and takes zyrtec prn with good benefit.   Adverse food reaction Currently avoiding fish, strawberries, beans, eggs.   Patient had CT head due to abnormal sensations on the top of her head but no headaches. Imagining was normal. She is going to see ENT for some ear fullness.  Assessment and Plan: Denise Arias is a 40 y.o. female with: Drug reaction Past history - Patient developed hives after taking minocycline and some type of cough medicine from [place].  Interim history - Tolerated Covid-19 vaccines. Now concerned about Meloxicam and nausea.   She most likely had some irritation from the Meloxicam and the nausea was a side effect and not an allergy.   Continue to avoid minocycline.  Seasonal and perennial allergic rhinoconjunctivitis Past history - Mild rhino conjunctivitis symptoms with unknown triggers. 2021 skin testing showed: Positive to trees, mold and dust mites. Interim history - asymptomatic and only takes zyrtec prn.   Continue environmental control measures as below.  May use olopatadine eye drops 0.2% once a day as needed for itchy/watery eyes.  May use over the counter antihistamines such  as Zyrtec (cetirizine), Claritin (loratadine), Allegra (fexofenadine), or Xyzal (levocetirizine) daily as needed.  Sensation of fullness in ear Complaining of ear fullness.  Question if she has some eustachian tube dysfunction.  May use over the counter antihistamines such as Zyrtec (cetirizine), Claritin (loratadine), Allegra (fexofenadine), or Xyzal (levocetirizine) daily as needed.  May use Flonase (fluticasone) nasal spray 1 spray per nostril twice a day as needed and see if it helps your symptoms.   Follow up with ENT as scheduled.   Adverse food reaction Past history - Beans and eggs cause increased gas and diarrhea. Fish and strawberries cause pruritus. 2021 skin testing showed: Negative to foods.  Interim history - no reactions.   Continue to avoid foods that bother you - fish, strawberries, beans, eggs.   For mild symptoms you can take over the counter antihistamines such as Benadryl and monitor symptoms closely. If symptoms worsen or if you have severe symptoms including breathing issues, throat closure, significant swelling, whole body hives, severe diarrhea and vomiting, lightheadedness then seek immediate medical care.  Return if symptoms worsen or fail to improve.  Follow up with PCP is still having issues with head symptoms - denies headaches but symptoms concerning for possible migraines.   Meds ordered this encounter  Medications  . fluticasone (FLONASE) 50 MCG/ACT nasal spray    Sig: Place 1 spray into both nostrils 2 (two) times daily as needed.    Dispense:  16 g    Refill:  5   Lab Orders  No laboratory test(s) ordered today    Diagnostics: None.  Medication List:  Current Outpatient Medications  Medication Sig  Dispense Refill  . Blood Glucose Monitoring Suppl (TRUE METRIX METER) w/Device KIT Use to check BG once daily. 1 kit 0  . ferrous sulfate 325 (65 FE) MG tablet TAKE 1 TABLET BY MOUTH EVERY DAY WITH BREAKFAST 90 tablet 0  . fluticasone (FLONASE)  50 MCG/ACT nasal spray Place 1 spray into both nostrils 2 (two) times daily as needed. 16 g 5  . glucose blood test strip Use to check BG once daily. 100 each 2  . ibuprofen (ADVIL) 200 MG tablet Take by mouth.    . TRUEplus Lancets 28G MISC Use to check BG once daily. 100 each 2   No current facility-administered medications for this visit.   Allergies: Allergies  Allergen Reactions  . Tizanidine     Causes eyes to turn red.  . Tramadol Other (See Comments)    "Heart works fast" Eye become red  . Amoxicillin Nausea And Vomiting  . Banana Hives  . Fish Oil Rash  . Minocycline Rash and Hives    Rash per patient report.  . Multivitamins Other (See Comments)    sd her stomach/intestinal area swelled up after 2 hrs.  . Strawberry Extract Rash   I reviewed her past medical history, social history, family history, and environmental history and no significant changes have been reported from her previous visit.  Review of Systems  Constitutional: Negative for appetite change, chills, fever and unexpected weight change.  HENT: Negative for congestion and rhinorrhea.   Eyes: Negative for itching.  Respiratory: Negative for cough, chest tightness, shortness of breath and wheezing.   Cardiovascular: Negative for chest pain.  Gastrointestinal: Negative for abdominal pain.  Genitourinary: Negative for difficulty urinating.  Skin: Negative for rash.  Allergic/Immunologic: Positive for environmental allergies.  Neurological: Negative for headaches.   Objective: BP 118/80 (BP Location: Left Arm, Patient Position: Sitting, Cuff Size: Normal)   Pulse 71   Temp (!) 96.4 F (35.8 C) (Temporal)   Resp 18   Ht 5' 5.35" (1.66 m)   Wt 189 lb 1.6 oz (85.8 kg)   SpO2 98%   BMI 31.13 kg/m  Body mass index is 31.13 kg/m. Physical Exam Vitals and nursing note reviewed.  Constitutional:      Appearance: Normal appearance. She is well-developed.  HENT:     Head: Normocephalic and atraumatic.      Right Ear: Tympanic membrane and external ear normal.     Left Ear: Tympanic membrane and external ear normal.     Nose: Nose normal.     Mouth/Throat:     Mouth: Mucous membranes are moist.     Pharynx: Oropharynx is clear.  Eyes:     Conjunctiva/sclera: Conjunctivae normal.  Cardiovascular:     Rate and Rhythm: Normal rate and regular rhythm.     Heart sounds: Normal heart sounds. No murmur heard. No friction rub. No gallop.   Pulmonary:     Effort: Pulmonary effort is normal.     Breath sounds: Normal breath sounds. No wheezing, rhonchi or rales.  Musculoskeletal:     Cervical back: Neck supple.  Skin:    General: Skin is warm.     Findings: No rash.  Neurological:     Mental Status: She is alert and oriented to person, place, and time.  Psychiatric:        Behavior: Behavior normal.    Previous notes and tests were reviewed. The plan was reviewed with the patient/family, and all questions/concerned were addressed.  It was my  pleasure to see Denise Arias today and participate in her care. Please feel free to contact me with any questions or concerns.  Sincerely,  Rexene Alberts, DO Allergy & Immunology  [facility] office: [place] office: [phone]

## 2020-10-31 NOTE — Patient Instructions (Addendum)
Environmental allergies  2021 skin testing showed positive to trees, mold and dust mites.  Continue environmental control measures as below.  May use olopatadine eye drops 0.2% once a day as needed for itchy/watery eyes.  Ear fullness  May use over the counter antihistamines such as Zyrtec (cetirizine), Claritin (loratadine), Allegra (fexofenadine), or Xyzal (levocetirizine) daily as needed.  May use Flonase (fluticasone) nasal spray 1 spray per nostril twice a day as needed and see if it helps your symptoms.   Food:  Continue to avoid foods that bother you - fish, strawberries, beans, eggs.   For mild symptoms you can take over the counter antihistamines such as Benadryl and monitor symptoms closely. If symptoms worsen or if you have severe symptoms including breathing issues, throat closure, significant swelling, whole body hives, severe diarrhea and vomiting, lightheadedness then seek immediate medical care.  Drug allergies:  Avoid drugs that you had reactions to - minocycline.  Follow up as needed.  Follow up with ENT as scheduled.  Follow up with PCP regarding your head symptoms.  Control of House Dust Mite Allergen . Dust mite allergens are a common trigger of allergy and asthma symptoms. While they can be found throughout the house, these microscopic creatures thrive in warm, humid environments such as bedding, upholstered furniture and carpeting. . Because so much time is spent in the bedroom, it is essential to reduce mite levels there.  . Encase pillows, mattresses, and box springs in special allergen-proof fabric covers or airtight, zippered plastic covers.  . Bedding should be washed weekly in hot water (130 F) and dried in a hot dryer. Allergen-proof covers are available for comforters and pillows that can't be regularly washed.  Reyes Ivan the allergy-proof covers every few months. Minimize clutter in the bedroom. Keep pets out of the bedroom.  Marland Kitchen Keep humidity less than  50% by using a dehumidifier or air conditioning. You can buy a humidity measuring device called a hygrometer to monitor this.  . If possible, replace carpets with hardwood, linoleum, or washable area rugs. If that's not possible, vacuum frequently with a vacuum that has a HEPA filter. . Remove all upholstered furniture and non-washable window drapes from the bedroom. . Remove all non-washable stuffed toys from the bedroom.  Wash stuffed toys weekly. Reducing Pollen Exposure . Pollen seasons: trees (spring), grass (summer) and ragweed/weeds (fall). Marland Kitchen Keep windows closed in your home and car to lower pollen exposure.  Lilian Kapur air conditioning in the bedroom and throughout the house if possible.  . Avoid going out in dry windy days - especially early morning. . Pollen counts are highest between 5 - 10 AM and on dry, hot and windy days.  . Save outside activities for late afternoon or after a heavy rain, when pollen levels are lower.  . Avoid mowing of grass if you have grass pollen allergy. Marland Kitchen Be aware that pollen can also be transported indoors on people and pets.  . Dry your clothes in an automatic dryer rather than hanging them outside where they might collect pollen.  . Rinse hair and eyes before bedtime. Mold Control . Mold and fungi can grow on a variety of surfaces provided certain temperature and moisture conditions exist.  . Outdoor molds grow on plants, decaying vegetation and soil. The major outdoor mold, Alternaria and Cladosporium, are found in very high numbers during hot and dry conditions. Generally, a late summer - fall peak is seen for common outdoor fungal spores. Rain will temporarily lower  outdoor mold spore count, but counts rise rapidly when the rainy period ends. . The most important indoor molds are Aspergillus and Penicillium. Dark, humid and poorly ventilated basements are ideal sites for mold growth. The next most common sites of mold growth are the bathroom and the  kitchen. Outdoor (Seasonal) Mold Control . Use air conditioning and keep windows closed. . Avoid exposure to decaying vegetation. Marland Kitchen Avoid leaf raking. . Avoid grain handling. . Consider wearing a face mask if working in moldy areas.  Indoor (Perennial) Mold Control  . Maintain humidity below 50%. . Get rid of mold growth on hard surfaces with water, detergent and, if necessary, 5% bleach (do not mix with other cleaners). Then dry the area completely. If mold covers an area more than 10 square feet, consider hiring an indoor environmental professional. . For clothing, washing with soap and water is best. If moldy items cannot be cleaned and dried, throw them away. . Remove sources e.g. contaminated carpets. . Repair and seal leaking roofs or pipes. Using dehumidifiers in damp basements may be helpful, but empty the water and clean units regularly to prevent mildew from forming. All rooms, especially basements, bathrooms and kitchens, require ventilation and cleaning to deter mold and mildew growth. Avoid carpeting on concrete or damp floors, and storing items in damp areas.   Skin care recommendations  Bath time: . Always use lukewarm water. AVOID very hot or cold water. Marland Kitchen Keep bathing time to 5-10 minutes. . Do NOT use bubble bath. . Use a mild soap and use just enough to wash the dirty areas. . Do NOT scrub skin vigorously.  . After bathing, pat dry your skin with a towel. Do NOT rub or scrub the skin.  Moisturizers and prescriptions:  . ALWAYS apply moisturizers immediately after bathing (within 3 minutes). This helps to lock-in moisture. . Use the moisturizer several times a day over the whole body. Peri Jefferson summer moisturizers include: Aveeno, CeraVe, Cetaphil. Peri Jefferson winter moisturizers include: Aquaphor, Vaseline, Cerave, Cetaphil, Eucerin, Vanicream. . When using moisturizers along with medications, the moisturizer should be applied about one hour after applying the medication to  prevent diluting effect of the medication or moisturize around where you applied the medications. When not using medications, the moisturizer can be continued twice daily as maintenance.  Laundry and clothing: . Avoid laundry products with added color or perfumes. . Use unscented hypo-allergenic laundry products such as Tide free, Cheer free & gentle, and All free and clear.  . If the skin still seems dry or sensitive, you can try double-rinsing the clothes. . Avoid tight or scratchy clothing such as wool. . Do not use fabric softeners or dyer sheets.

## 2020-10-31 NOTE — Assessment & Plan Note (Signed)
Past history - Beans and eggs cause increased gas and diarrhea. Fish and strawberries cause pruritus. 2021 skin testing showed: Negative to foods.  Interim history - no reactions.   Continue to avoid foods that bother you - fish, strawberries, beans, eggs.   For mild symptoms you can take over the counter antihistamines such as Benadryl and monitor symptoms closely. If symptoms worsen or if you have severe symptoms including breathing issues, throat closure, significant swelling, whole body hives, severe diarrhea and vomiting, lightheadedness then seek immediate medical care.

## 2020-10-31 NOTE — Assessment & Plan Note (Signed)
Complaining of ear fullness.  Question if she has some eustachian tube dysfunction.  May use over the counter antihistamines such as Zyrtec (cetirizine), Claritin (loratadine), Allegra (fexofenadine), or Xyzal (levocetirizine) daily as needed.  May use Flonase (fluticasone) nasal spray 1 spray per nostril twice a day as needed and see if it helps your symptoms.   Follow up with ENT as scheduled.

## 2020-11-09 ENCOUNTER — Encounter: Payer: Self-pay | Admitting: Neurology

## 2020-11-09 ENCOUNTER — Ambulatory Visit (INDEPENDENT_AMBULATORY_CARE_PROVIDER_SITE_OTHER): Payer: 59 | Admitting: Neurology

## 2020-11-09 VITALS — BP 122/86 | HR 68 | Ht 65.0 in | Wt 188.5 lb

## 2020-11-09 DIAGNOSIS — R202 Paresthesia of skin: Secondary | ICD-10-CM

## 2020-11-09 DIAGNOSIS — R519 Headache, unspecified: Secondary | ICD-10-CM

## 2020-11-09 MED ORDER — DULOXETINE HCL 20 MG PO CPEP: 20.0000 mg | ORAL_CAPSULE | Freq: Every day | ORAL | 6 refills | Status: DC

## 2020-11-09 NOTE — H&P (View-Only) (Signed)
Patient ID: Denise Arias, female    DOB: [birth year], 40 y.o.   MRN: [id number]  Chief Complaint  Patient presents with  . Pre-op Exam      ICD-10-CM   1. Macromastia  N62   2. Back pain of thoracolumbar region  M54.50    M54.6   3. Postural kyphosis, thoracic region  M40.04      History of Present Illness: Denise Arias is a 40 y.o.  female  with a history of macromastia.  She presents for preoperative evaluation for upcoming procedure, Bilateral Breast Reduction, scheduled for 11/21/2020 with Dr.  Claudia Desanctis.  Patient is here today with her husband.  No personal or family history of anesthesia. No history of DVT/PE.  No family history of DVT/PE.  No family or personal history of bleeding or clotting disorders.  Patient is not currently taking any blood thinners.  No history of CVA/MI.   Summary of Previous Visit: She is currently a 65 D and wants to be a C cup.  She has not had any previous breast procedures or biopsies.  Anticipate approximately 630 g of tissue removed from each side.  PMH Significant for: Migraines, recently saw neurology for the first time and recommended lab work to rule out PMR and other systemic inflammatory processes.  Recent CT head on 10/14/2020 was unremarkable. Past medical history of GERD, prediabetes, anemia of chronic disease.  Patient with recent mammogram on 10/10/2020, negative. Patient reports she is overall doing well with the exception of her headaches.  Past Medical History: Allergies: Allergies  Allergen Reactions  . Tizanidine     Causes eyes to turn red.  . Tramadol Other (See Comments)    "Heart works fast" Eye become red  . Amoxicillin Nausea And Vomiting  . Banana Hives  . Fish Oil Rash  . Minocycline Rash and Hives    Rash per patient report.  . Multivitamins Other (See Comments)    sd her stomach/intestinal area swelled up after 2 hrs.  . Strawberry Extract Rash  . Vitamin C Rash    Current Medications:  Current Outpatient  Medications:  .  Blood Glucose Monitoring Suppl (TRUE METRIX METER) w/Device KIT, Use to check BG once daily., Disp: 1 kit, Rfl: 0 .  DULoxetine (CYMBALTA) 20 MG capsule, Take 1 capsule (20 mg total) by mouth daily., Disp: 30 capsule, Rfl: 6 .  ferrous sulfate 325 (65 FE) MG tablet, TAKE 1 TABLET BY MOUTH EVERY DAY WITH BREAKFAST, Disp: 90 tablet, Rfl: 0 .  fluticasone (FLONASE) 50 MCG/ACT nasal spray, Place 1 spray into both nostrils 2 (two) times daily as needed., Disp: 16 g, Rfl: 5 .  glucose blood test strip, Use to check BG once daily., Disp: 100 each, Rfl: 2 .  HYDROcodone-acetaminophen (NORCO) 5-325 MG tablet, Take 1 tablet by mouth every 6 (six) hours as needed for up to 5 days for severe pain., Disp: 20 tablet, Rfl: 0 .  ibuprofen (ADVIL) 200 MG tablet, Take by mouth., Disp: , Rfl:  .  ondansetron (ZOFRAN) 4 MG tablet, Take 1 tablet (4 mg total) by mouth every 8 (eight) hours as needed for nausea or vomiting., Disp: 20 tablet, Rfl: 0 .  TRUEplus Lancets 28G MISC, Use to check BG once daily., Disp: 100 each, Rfl: 2  Past Medical Problems: Past Medical History:  Diagnosis Date  . Allergy    RHINITIS  . GERD (gastroesophageal reflux disease)   . Neck pain   . Palpitations  Past Surgical History: Past Surgical History:  Procedure Laterality Date  . NO PAST SURGERIES    . VAGINAL DELIVERY     X 4    Social History: Social History   Socioeconomic History  . Marital status: Married    Spouse name: Not on file  . Number of children: 5  . Years of education: some colleg  . Highest education level: Some college, no degree  Occupational History  . Occupation: Homemaker  Tobacco Use  . Smoking status: Never Smoker  . Smokeless tobacco: Never Used  Vaping Use  . Vaping Use: Never used  Substance and Sexual Activity  . Alcohol use: No  . Drug use: No  . Sexual activity: Yes    Comment: last week  Other Topics Concern  . Not on file  Social History Narrative   Lives  at home with her family.   Right-handed.   Caffeine use: one cup caffeine per day.   Social Determinants of Health   Financial Resource Strain: Not on file  Food Insecurity: Not on file  Transportation Needs: Not on file  Physical Activity: Not on file  Stress: Not on file  Social Connections: Not on file  Intimate Partner Violence: Not on file    Family History: Family History  Problem Relation Age of Onset  . Diabetes Mother   . Hypertension Mother   . Other Father        unsure of medical history  . Thyroid disease Sister        goiter  . Diabetes Sister   . Other Brother        murdered  . Asthma Maternal Uncle     Review of Systems: Review of Systems  Constitutional: Negative.   Respiratory: Negative.   Cardiovascular: Negative.   Gastrointestinal: Negative.   Skin: Negative.   Neurological: Negative.     Physical Exam: Vital Signs BP 125/82 (BP Location: Left Arm, Patient Position: Sitting, Cuff Size: Large)   Pulse 75   Ht 5' 5" (1.651 m)   Wt 189 lb 3.2 oz (85.8 kg)   SpO2 99%   BMI 31.48 kg/m   Physical Exam Constitutional:      General: Not in acute distress.    Appearance: Normal appearance. Not ill-appearing.  HENT:     Head: Normocephalic and atraumatic.  Eyes:     Pupils: Pupils are equal, round Neck:     Musculoskeletal: Normal range of motion.  Cardiovascular:     Rate and Rhythm: Normal rate and regular rhythm.     Pulses: Normal pulses.     Heart sounds: Normal heart sounds. No murmur.  Pulmonary:     Effort: Pulmonary effort is normal. No respiratory distress.     Breath sounds: Normal breath sounds. No wheezing.  Abdominal:     General: Abdomen is flat. There is no distension.  Musculoskeletal: Normal range of motion.  Skin:    General: Skin is warm and dry.     Findings: No erythema or rash.  Neurological:     General: No focal deficit present.     Mental Status: Alert and oriented to person, place, and time. Mental  status is at baseline.     Motor: No weakness.  Psychiatric:        Mood and Affect: Mood normal.        Behavior: Behavior normal.    Assessment/Plan: The patient is scheduled for bilateral breast reduction with Dr. Claudia Desanctis.  Risks, benefits,  and alternatives of procedure discussed, questions answered and consent obtained.    Smoking Status: Non-smoker; Counseling Given?  N/A Last Mammogram: 10/10/2020; Results: Negative  Caprini Score: 4, moderate; Risk Factors include: Age, BMI greater than 25, and length of planned surgery. Recommendation for mechanical and pharmacological prophylaxis while hospitalized. Encourage early ambulation.   Pictures obtained:_0   Post-op Rx sent to pharmacy: Norco, Zofran, Keflex  Patient was provided with the breast reduction and General Surgical Risk consent document and Pain Medication Agreement prior to their appointment.  They had adequate time to read through the risk consent documents and Pain Medication Agreement. We also discussed them in person together during this preop appointment. All of their questions were answered to their satisfaction.  Recommended calling if they have any further questions.  Risk consent form and Pain Medication Agreement to be scanned into patient's chart.  The risk that can be encountered with breast reduction were discussed and include the following but not limited to these:  Breast asymmetry, fluid accumulation, firmness of the breast, inability to breast feed, loss of nipple or areola, skin loss, decrease or no nipple sensation, fat necrosis of the breast tissue, bleeding, infection, healing delay.  There are risks of anesthesia, changes to skin sensation and injury to nerves or blood vessels.  The muscle can be temporarily or permanently injured.  You may have an allergic reaction to tape, suture, glue, blood products which can result in skin discoloration, swelling, pain, skin lesions, poor healing.  Any of these can lead  to the need for revisonal surgery or stage procedures.  A reduction has potential to interfere with diagnostic procedures.  Nipple or breast piercing can increase risks of infection.  This procedure is best done when the breast is fully developed.  Changes in the breast will continue to occur over time.  Pregnancy can alter the outcomes of previous breast reduction surgery, weight gain and weigh loss can also effect the long term appearance.    Electronically signed by: Carola Rhine Scheeler, PA-C 11/10/2020 1:43 PM

## 2020-11-09 NOTE — Progress Notes (Signed)
Chief Complaint  Patient presents with  . New Patient (Initial Visit)    Referred from ED. She is here with her husband, Ahmed. She is feels like "something is moving in her head - like waves or insects flying around". She had negative CT head in ED. She also has numbness in her left arm and "freezing" on her left pinky finger. Reports intermittent neck pain. Says she tries to avoid medications because she is "sensitive" to them.    HISTORICAL  Denise Arias is a 40 year old female seen in request by PA Charlyne Quale, Samantha for evaluation of skull sensitivity, her primary care physician is Dr. Wynetta Emery, Dalbert Batman, she is accompanied by her husband at today's visit on November 09, 2020  I reviewed and summarized the referring note.  She had long history of migraine headaches, it happened intermittently, usually lateralized severe pounding headache, with light noise sensitivity  But since December 2021, she began to experience different headache, she described it on the surface of her skull, on the left parietal region, constant nagging burning sensation, seems to be better in the morning, worsening at the evening, she presented to emergency room on October 14, 2020 for her bothersome symptoms  Personally reviewed CT in Jan 2022,  that was normal  Laboratory evaluations: Normal thyroid functional test, CBC hemoglobin of 13.3, BMP  She also complains of diffuse body achy pain, especially bilateral arm deep achy, worsening by repetitive use   REVIEW OF SYSTEMS: Full 14 system review of systems performed and notable only for as above All other review of systems were negative.  ALLERGIES: Allergies  Allergen Reactions  . Tizanidine     Causes eyes to turn red.  . Tramadol Other (See Comments)    "Heart works fast" Eye become red  . Amoxicillin Nausea And Vomiting  . Banana Hives  . Fish Oil Rash  . Minocycline Rash and Hives    Rash per patient report.  . Multivitamins Other (See  Comments)    sd her stomach/intestinal area swelled up after 2 hrs.  . Strawberry Extract Rash  . Vitamin C Rash    HOME MEDICATIONS: Current Outpatient Medications  Medication Sig Dispense Refill  . Blood Glucose Monitoring Suppl (TRUE METRIX METER) w/Device KIT Use to check BG once daily. 1 kit 0  . ferrous sulfate 325 (65 FE) MG tablet TAKE 1 TABLET BY MOUTH EVERY DAY WITH BREAKFAST 90 tablet 0  . fluticasone (FLONASE) 50 MCG/ACT nasal spray Place 1 spray into both nostrils 2 (two) times daily as needed. 16 g 5  . glucose blood test strip Use to check BG once daily. 100 each 2  . ibuprofen (ADVIL) 200 MG tablet Take by mouth.    . TRUEplus Lancets 28G MISC Use to check BG once daily. 100 each 2   No current facility-administered medications for this visit.    PAST MEDICAL HISTORY: Past Medical History:  Diagnosis Date  . Allergy    RHINITIS  . GERD (gastroesophageal reflux disease)   . Neck pain   . Palpitations     PAST SURGICAL HISTORY: Past Surgical History:  Procedure Laterality Date  . NO PAST SURGERIES    . VAGINAL DELIVERY     X 4    FAMILY HISTORY: Family History  Problem Relation Age of Onset  . Diabetes Mother   . Hypertension Mother   . Other Father        unsure of medical history  . Thyroid disease Sister  goiter  . Diabetes Sister   . Other Brother        murdered  . Asthma Maternal Uncle     SOCIAL HISTORY: Social History   Socioeconomic History  . Marital status: Married    Spouse name: Not on file  . Number of children: 5  . Years of education: some colleg  . Highest education level: Some college, no degree  Occupational History  . Occupation: Homemaker  Tobacco Use  . Smoking status: Never Smoker  . Smokeless tobacco: Never Used  Vaping Use  . Vaping Use: Never used  Substance and Sexual Activity  . Alcohol use: No  . Drug use: No  . Sexual activity: Yes    Comment: last week  Other Topics Concern  . Not on file   Social History Narrative   Lives at home with her family.   Right-handed.   Caffeine use: one cup caffeine per day.   Social Determinants of Health   Financial Resource Strain: Not on file  Food Insecurity: Not on file  Transportation Needs: Not on file  Physical Activity: Not on file  Stress: Not on file  Social Connections: Not on file  Intimate Partner Violence: Not on file     PHYSICAL EXAM   Vitals:   11/09/20 0832  BP: 122/86  Pulse: 68  Weight: 188 lb 8 oz (85.5 kg)  Height: $Remove'5\' 5"'VSoglfS$  (1.651 m)   Not recorded     Body mass index is 31.37 kg/m.  PHYSICAL EXAMNIATION:  Gen: NAD, conversant, well nourised, well groomed                     Cardiovascular: Regular rate rhythm, no peripheral edema, warm, nontender. Eyes: Conjunctivae clear without exudates or hemorrhage Neck: Supple, no carotid bruits. Pulmonary: Clear to auscultation bilaterally   NEUROLOGICAL EXAM:  MENTAL STATUS: Speech:    Speech is normal; fluent and spontaneous with normal comprehension.  Cognition:     Orientation to time, place and person     Normal recent and remote memory     Normal Attention span and concentration     Normal Language, naming, repeating,spontaneous speech     Fund of knowledge   CRANIAL NERVES: CN II: Visual fields are full to confrontation. Pupils are round equal and briskly reactive to light. CN III, IV, VI: extraocular movement are normal. No ptosis. CN V: Facial sensation is intact to light touch CN VII: Face is symmetric with normal eye closure  CN VIII: Hearing is normal to causal conversation. CN IX, X: Phonation is normal. CN XI: Head turning and shoulder shrug are intact  MOTOR: There is no pronator drift of out-stretched arms. Muscle bulk and tone are normal. Muscle strength is normal.  REFLEXES: Reflexes are 2+ and symmetric at the biceps, triceps, knees, and ankles. Plantar responses are flexor.  SENSORY: Intact to light touch, pinprick and  vibratory sensation are intact in fingers and toes.  COORDINATION: There is no trunk or limb dysmetria noted.  GAIT/STANCE: Posture is normal. Gait is steady with normal steps, base, arm swing, and turning. Heel and toe walking are normal. Tandem gait is normal.  Romberg is absent.   DIAGNOSTIC DATA (LABS, IMAGING, TESTING) - I reviewed patient records, labs, notes, testing and imaging myself where available.   ASSESSMENT AND PLAN  Cayman Brogden is a 40 y.o. female   History of migraine headaches, not present with different headaches, with some migraine features Also complains of  bilateral upper extremity paresthesia, deep achy pain, pain  Normal neurological examinations, CT head  Laboratory evaluation including inflammatory markers to rule out polymyalgia rheumatica, and other systemic inflammatory process  Cymbalta 20 mg every night  NSAIDs as needed   Marcial Pacas, M.D. Ph.D.  [facility] [address] [place], [place] [address] Ph: [phone] Fax: [phone]  CC:  Amaryllis Dyke, PA-C [place],  [place] [id number]  Ladell Pier, MD

## 2020-11-09 NOTE — Progress Notes (Signed)
° °  Patient ID: Denise Arias, female    DOB: 08/27/1981, 39 y.o.   MRN: [id number] ° °Chief Complaint  °Patient presents with  °• Pre-op Exam  ° ° °  ICD-10-CM   °1. Macromastia  N62   °2. Back pain of thoracolumbar region  M54.50   ° M54.6   °3. Postural kyphosis, thoracic region  M40.04   ° ° ° °History of Present Illness: °Denise Arias is a 39 y.o.  female  with a history of macromastia.  She presents for preoperative evaluation for upcoming procedure, Bilateral Breast Reduction, scheduled for 11/21/2020 with Dr.  Pace.  Patient is here today with her husband. ° °No personal or family history of anesthesia. °No history of DVT/PE.  No family history of DVT/PE.  No family or personal history of bleeding or clotting disorders.  Patient is not currently taking any blood thinners.  No history of CVA/MI.  ° °Summary of Previous Visit: She is currently a 42 D and wants to be a C cup.  She has not had any previous breast procedures or biopsies.  Anticipate approximately 630 g of tissue removed from each side. ° °PMH Significant for: Migraines, recently saw neurology for the first time and recommended lab work to rule out PMR and other systemic inflammatory processes.  Recent CT head on 10/14/2020 was unremarkable. °Past medical history of GERD, prediabetes, anemia of chronic disease. ° °Patient with recent mammogram on 10/10/2020, negative. °Patient reports she is overall doing well with the exception of her headaches. ° °Past Medical History: °Allergies: °Allergies  °Allergen Reactions  °• Tizanidine   °  Causes eyes to turn red.  °• Tramadol Other (See Comments)  °  "Heart works fast" °Eye become red  °• Amoxicillin Nausea And Vomiting  °• Banana Hives  °• Fish Oil Rash  °• Minocycline Rash and Hives  °  Rash per patient report.  °• Multivitamins Other (See Comments)  °  sd her stomach/intestinal area swelled up after 2 hrs.  °• Strawberry Extract Rash  °• Vitamin C Rash  ° ° °Current Medications: ° °Current Outpatient  Medications:  °•  Blood Glucose Monitoring Suppl (TRUE METRIX METER) w/Device KIT, Use to check BG once daily., Disp: 1 kit, Rfl: 0 °•  DULoxetine (CYMBALTA) 20 MG capsule, Take 1 capsule (20 mg total) by mouth daily., Disp: 30 capsule, Rfl: 6 °•  ferrous sulfate 325 (65 FE) MG tablet, TAKE 1 TABLET BY MOUTH EVERY DAY WITH BREAKFAST, Disp: 90 tablet, Rfl: 0 °•  fluticasone (FLONASE) 50 MCG/ACT nasal spray, Place 1 spray into both nostrils 2 (two) times daily as needed., Disp: 16 g, Rfl: 5 °•  glucose blood test strip, Use to check BG once daily., Disp: 100 each, Rfl: 2 °•  HYDROcodone-acetaminophen (NORCO) 5-325 MG tablet, Take 1 tablet by mouth every 6 (six) hours as needed for up to 5 days for severe pain., Disp: 20 tablet, Rfl: 0 °•  ibuprofen (ADVIL) 200 MG tablet, Take by mouth., Disp: , Rfl:  °•  ondansetron (ZOFRAN) 4 MG tablet, Take 1 tablet (4 mg total) by mouth every 8 (eight) hours as needed for nausea or vomiting., Disp: 20 tablet, Rfl: 0 °•  TRUEplus Lancets 28G MISC, Use to check BG once daily., Disp: 100 each, Rfl: 2 ° °Past Medical Problems: °Past Medical History:  °Diagnosis Date  °• Allergy   ° RHINITIS  °• GERD (gastroesophageal reflux disease)   °• Neck pain   °• Palpitations   ° ° °  Past Surgical History: °Past Surgical History:  °Procedure Laterality Date  °• NO PAST SURGERIES    °• VAGINAL DELIVERY    ° X 4  ° ° °Social History: °Social History  ° °Socioeconomic History  °• Marital status: Married  °  Spouse name: Not on file  °• Number of children: 5  °• Years of education: some colleg  °• Highest education level: Some college, no degree  °Occupational History  °• Occupation: Homemaker  °Tobacco Use  °• Smoking status: Never Smoker  °• Smokeless tobacco: Never Used  °Vaping Use  °• Vaping Use: Never used  °Substance and Sexual Activity  °• Alcohol use: No  °• Drug use: No  °• Sexual activity: Yes  °  Comment: last week  °Other Topics Concern  °• Not on file  °Social History Narrative  ° Lives  at home with her family.  ° Right-handed.  ° Caffeine use: one cup caffeine per day.  ° °Social Determinants of Health  ° °Financial Resource Strain: Not on file  °Food Insecurity: Not on file  °Transportation Needs: Not on file  °Physical Activity: Not on file  °Stress: Not on file  °Social Connections: Not on file  °Intimate Partner Violence: Not on file  ° ° °Family History: °Family History  °Problem Relation Age of Onset  °• Diabetes Mother   °• Hypertension Mother   °• Other Father   °     unsure of medical history  °• Thyroid disease Sister   °     goiter  °• Diabetes Sister   °• Other Brother   °     murdered  °• Asthma Maternal Uncle   ° ° °Review of Systems: °Review of Systems  °Constitutional: Negative.   °Respiratory: Negative.   °Cardiovascular: Negative.   °Gastrointestinal: Negative.   °Skin: Negative.   °Neurological: Negative.   ° ° °Physical Exam: °Vital Signs °BP 125/82 (BP Location: Left Arm, Patient Position: Sitting, Cuff Size: Large)    Pulse 75    Ht 5' 5" (1.651 m)    Wt 189 lb 3.2 oz (85.8 kg)    SpO2 99%    BMI 31.48 kg/m²  ° °Physical Exam °Constitutional:   °   General: Not in acute distress. °   Appearance: Normal appearance. Not ill-appearing.  °HENT:  °   Head: Normocephalic and atraumatic.  °Eyes:  °   Pupils: Pupils are equal, round °Neck:  °   Musculoskeletal: Normal range of motion.  °Cardiovascular:  °   Rate and Rhythm: Normal rate and regular rhythm.  °   Pulses: Normal pulses.  °   Heart sounds: Normal heart sounds. No murmur.  °Pulmonary:  °   Effort: Pulmonary effort is normal. No respiratory distress.  °   Breath sounds: Normal breath sounds. No wheezing.  °Abdominal:  °   General: Abdomen is flat. There is no distension.  °Musculoskeletal: Normal range of motion.  °Skin: °   General: Skin is warm and dry.  °   Findings: No erythema or rash.  °Neurological:  °   General: No focal deficit present.  °   Mental Status: Alert and oriented to person, place, and time. Mental  status is at baseline.  °   Motor: No weakness.  °Psychiatric:     °   Mood and Affect: Mood normal.     °   Behavior: Behavior normal.  ° ° °Assessment/Plan: °The patient is scheduled for bilateral breast reduction with   Dr. Claudia Desanctis.  Risks, benefits, and alternatives of procedure discussed, questions answered and consent obtained.    Smoking Status: Non-smoker; Counseling Given?  N/A Last Mammogram: 10/10/2020; Results: Negative  Caprini Score: 4, moderate; Risk Factors include: Age, BMI greater than 25, and length of planned surgery. Recommendation for mechanical and pharmacological prophylaxis while hospitalized. Encourage early ambulation.   Pictures obtained:_0   Post-op Rx sent to pharmacy: Norco, Zofran, Keflex  Patient was provided with the breast reduction and General Surgical Risk consent document and Pain Medication Agreement prior to their appointment.  They had adequate time to read through the risk consent documents and Pain Medication Agreement. We also discussed them in person together during this preop appointment. All of their questions were answered to their satisfaction.  Recommended calling if they have any further questions.  Risk consent form and Pain Medication Agreement to be scanned into patient's chart.  The risk that can be encountered with breast reduction were discussed and include the following but not limited to these:  Breast asymmetry, fluid accumulation, firmness of the breast, inability to breast feed, loss of nipple or areola, skin loss, decrease or no nipple sensation, fat necrosis of the breast tissue, bleeding, infection, healing delay.  There are risks of anesthesia, changes to skin sensation and injury to nerves or blood vessels.  The muscle can be temporarily or permanently injured.  You may have an allergic reaction to tape, suture, glue, blood products which can result in skin discoloration, swelling, pain, skin lesions, poor healing.  Any of these can lead  to the need for revisonal surgery or stage procedures.  A reduction has potential to interfere with diagnostic procedures.  Nipple or breast piercing can increase risks of infection.  This procedure is best done when the breast is fully developed.  Changes in the breast will continue to occur over time.  Pregnancy can alter the outcomes of previous breast reduction surgery, weight gain and weigh loss can also effect the long term appearance.    Electronically signed by: Carola Rhine Mycal Conde, PA-C 11/10/2020 1:43 PM

## 2020-11-10 ENCOUNTER — Ambulatory Visit (INDEPENDENT_AMBULATORY_CARE_PROVIDER_SITE_OTHER): Payer: 59 | Admitting: Surgical

## 2020-11-10 ENCOUNTER — Other Ambulatory Visit: Payer: Self-pay

## 2020-11-10 ENCOUNTER — Encounter: Payer: Self-pay | Admitting: Surgical

## 2020-11-10 VITALS — BP 125/82 | HR 75 | Ht 65.0 in | Wt 189.2 lb

## 2020-11-10 DIAGNOSIS — M4004 Postural kyphosis, thoracic region: Secondary | ICD-10-CM

## 2020-11-10 DIAGNOSIS — M545 Low back pain, unspecified: Secondary | ICD-10-CM

## 2020-11-10 DIAGNOSIS — M546 Pain in thoracic spine: Secondary | ICD-10-CM

## 2020-11-10 DIAGNOSIS — N62 Hypertrophy of breast: Secondary | ICD-10-CM

## 2020-11-10 LAB — VITAMIN B12: Vitamin B-12: 546 pg/mL (ref 232–1245)

## 2020-11-10 LAB — CK: Total CK: 63 U/L (ref 32–182)

## 2020-11-10 LAB — SEDIMENTATION RATE: Sed Rate: 14 mm/hr (ref 0–32)

## 2020-11-10 LAB — ANA W/REFLEX IF POSITIVE: Anti Nuclear Antibody (ANA): NEGATIVE

## 2020-11-10 LAB — C-REACTIVE PROTEIN: CRP: 13 mg/L — ABNORMAL HIGH (ref 0–10)

## 2020-11-10 LAB — RPR: RPR Ser Ql: NONREACTIVE

## 2020-11-10 MED ORDER — HYDROCODONE-ACETAMINOPHEN 5-325 MG PO TABS: 1.0000 | ORAL_TABLET | Freq: Four times a day (QID) | ORAL | 0 refills | Status: DC | PRN

## 2020-11-10 MED ORDER — ONDANSETRON HCL 4 MG PO TABS: 4.0000 mg | ORAL_TABLET | Freq: Three times a day (TID) | ORAL | 0 refills | Status: DC | PRN

## 2020-11-13 ENCOUNTER — Other Ambulatory Visit: Payer: Self-pay | Admitting: Surgical

## 2020-11-13 MED ORDER — HYDROCODONE-ACETAMINOPHEN 7.5-325 MG PO TABS: 1.0000 | ORAL_TABLET | Freq: Four times a day (QID) | ORAL | 0 refills | Status: AC | PRN

## 2020-11-13 NOTE — Progress Notes (Signed)
Called pharmacy, canceled prescription for Norco 5.  Pharmacy stated that the Norco 5 were on back order and unavailable.  Confirmed with pharmacy cancellation.  Resent prescription.

## 2020-11-14 ENCOUNTER — Other Ambulatory Visit: Payer: Self-pay

## 2020-11-14 ENCOUNTER — Encounter (HOSPITAL_BASED_OUTPATIENT_CLINIC_OR_DEPARTMENT_OTHER): Payer: Self-pay | Admitting: Plastic Surgery

## 2020-11-17 ENCOUNTER — Telehealth: Payer: Self-pay

## 2020-11-17 NOTE — Telephone Encounter (Signed)
Patient came in to clinic requesting to speak with Dr. Arita Miss. I informed her he was not in clinic today and she requested a call back from clinical staff. She would like to give the Breast size she would like to be after surgery and also give [place] post operative instructions.

## 2020-11-18 ENCOUNTER — Other Ambulatory Visit (HOSPITAL_COMMUNITY)
Admission: RE | Admit: 2020-11-18 | Discharge: 2020-11-18 | Disposition: A | Discharge status: Home / Self Care | Payer: 59 | Source: Ambulatory Visit | Attending: Plastic Surgery | Admitting: Plastic Surgery

## 2020-11-18 DIAGNOSIS — Z20822 Contact with and (suspected) exposure to covid-19: Secondary | ICD-10-CM | POA: Insufficient documentation

## 2020-11-18 DIAGNOSIS — Z01812 Encounter for preprocedural laboratory examination: Secondary | ICD-10-CM | POA: Insufficient documentation

## 2020-11-18 LAB — SARS CORONAVIRUS 2 (TAT 6-24 HRS): SARS Coronavirus 2: NEGATIVE

## 2020-11-20 ENCOUNTER — Telehealth: Payer: Self-pay

## 2020-11-20 NOTE — Telephone Encounter (Signed)
11/17/20- call returned to pt regarding the following: Pt is requesting verification of predicted "bra size"- she is hoping to be in the " 38-C cup range".  I confirmed with Dr. Thomos Lemons consultation note that her postop cup size will most likely be in the C cup/range. She also wanted to know what time she is scheduled for surgery on 11/21/20- she was called by [facility] & her surgery is now @ 7:30 am on that day instead of 9:45am. I read to her the new surgery schedule- she will arrive at 6:00am & surgery is 7:30am.  I also confirmed her Covid test & postop appointment with our office. She agrees with the above information.

## 2020-11-20 NOTE — Telephone Encounter (Signed)
Opened in error

## 2020-11-21 ENCOUNTER — Ambulatory Visit (HOSPITAL_BASED_OUTPATIENT_CLINIC_OR_DEPARTMENT_OTHER): Payer: 59 | Admitting: Certified Registered"

## 2020-11-21 ENCOUNTER — Ambulatory Visit (HOSPITAL_BASED_OUTPATIENT_CLINIC_OR_DEPARTMENT_OTHER)
Admission: RE | Admit: 2020-11-21 | Discharge: 2020-11-21 | Disposition: A | Discharge status: Home / Self Care | Payer: 59 | Attending: Plastic Surgery | Admitting: Plastic Surgery

## 2020-11-21 ENCOUNTER — Encounter (HOSPITAL_BASED_OUTPATIENT_CLINIC_OR_DEPARTMENT_OTHER): Payer: Self-pay | Admitting: Plastic Surgery

## 2020-11-21 ENCOUNTER — Encounter (HOSPITAL_BASED_OUTPATIENT_CLINIC_OR_DEPARTMENT_OTHER)
Admission: RE | Disposition: A | Discharge status: Home / Self Care | Payer: Self-pay | Source: Home / Self Care | Attending: Plastic Surgery

## 2020-11-21 ENCOUNTER — Other Ambulatory Visit: Payer: Self-pay

## 2020-11-21 DIAGNOSIS — Z9109 Other allergy status, other than to drugs and biological substances: Secondary | ICD-10-CM | POA: Diagnosis not present

## 2020-11-21 DIAGNOSIS — Z881 Allergy status to other antibiotic agents status: Secondary | ICD-10-CM | POA: Insufficient documentation

## 2020-11-21 DIAGNOSIS — N62 Hypertrophy of breast: Secondary | ICD-10-CM | POA: Insufficient documentation

## 2020-11-21 DIAGNOSIS — Z888 Allergy status to other drugs, medicaments and biological substances status: Secondary | ICD-10-CM | POA: Diagnosis not present

## 2020-11-21 DIAGNOSIS — M546 Pain in thoracic spine: Secondary | ICD-10-CM | POA: Insufficient documentation

## 2020-11-21 HISTORY — PX: BREAST REDUCTION SURGERY: SHX8

## 2020-11-21 LAB — POCT PREGNANCY, URINE: Preg Test, Ur: NEGATIVE

## 2020-11-21 SURGERY — MAMMOPLASTY, REDUCTION
Anesthesia: General | Site: Breast | Laterality: Bilateral

## 2020-11-21 MED ORDER — PHENYLEPHRINE HCL (PRESSORS) 10 MG/ML IV SOLN
INTRAVENOUS | Status: DC | PRN
  Administered 2020-11-21: 80 ug via INTRAVENOUS
  Administered 2020-11-21: 120 ug via INTRAVENOUS
  Administered 2020-11-21: 80 ug via INTRAVENOUS

## 2020-11-21 MED ORDER — PROPOFOL 10 MG/ML IV BOLUS
INTRAVENOUS | Status: DC | PRN
  Administered 2020-11-21: 150 mg via INTRAVENOUS

## 2020-11-21 MED ORDER — CEFAZOLIN SODIUM-DEXTROSE 2-4 GM/100ML-% IV SOLN
INTRAVENOUS | Status: AC
  Filled 2020-11-21: qty 100

## 2020-11-21 MED ORDER — HYDROMORPHONE HCL 1 MG/ML IJ SOLN
INTRAMUSCULAR | Status: AC
  Filled 2020-11-21: qty 0.5

## 2020-11-21 MED ORDER — ONDANSETRON HCL 4 MG/2ML IJ SOLN
INTRAMUSCULAR | Status: DC | PRN
  Administered 2020-11-21: 4 mg via INTRAVENOUS

## 2020-11-21 MED ORDER — MIDAZOLAM HCL 2 MG/2ML IJ SOLN
INTRAMUSCULAR | Status: AC
  Filled 2020-11-21: qty 2

## 2020-11-21 MED ORDER — FENTANYL CITRATE (PF) 100 MCG/2ML IJ SOLN
INTRAMUSCULAR | Status: AC
  Filled 2020-11-21: qty 2

## 2020-11-21 MED ORDER — ROCURONIUM BROMIDE 10 MG/ML (PF) SYRINGE
PREFILLED_SYRINGE | INTRAVENOUS | Status: AC
  Filled 2020-11-21: qty 10

## 2020-11-21 MED ORDER — AMISULPRIDE (ANTIEMETIC) 5 MG/2ML IV SOLN: 10.0000 mg | Freq: Once | INTRAVENOUS | Status: DC | PRN

## 2020-11-21 MED ORDER — DEXAMETHASONE SODIUM PHOSPHATE 4 MG/ML IJ SOLN
INTRAMUSCULAR | Status: DC | PRN
  Administered 2020-11-21: 10 mg via INTRAVENOUS

## 2020-11-21 MED ORDER — MIDAZOLAM HCL 5 MG/5ML IJ SOLN
INTRAMUSCULAR | Status: DC | PRN
  Administered 2020-11-21: 2 mg via INTRAVENOUS

## 2020-11-21 MED ORDER — LACTATED RINGERS IV SOLN: INTRAVENOUS | Status: DC

## 2020-11-21 MED ORDER — HYDROMORPHONE HCL 1 MG/ML IJ SOLN
0.2500 mg | INTRAMUSCULAR | Status: DC | PRN
  Administered 2020-11-21 (×2): 0.5 mg via INTRAVENOUS

## 2020-11-21 MED ORDER — 0.9 % SODIUM CHLORIDE (POUR BTL) OPTIME
TOPICAL | Status: DC | PRN
  Administered 2020-11-21: 1000 mL

## 2020-11-21 MED ORDER — LIDOCAINE HCL (PF) 1 % IJ SOLN
INTRAMUSCULAR | Status: AC
  Filled 2020-11-21: qty 120

## 2020-11-21 MED ORDER — BUPIVACAINE HCL (PF) 0.25 % IJ SOLN
INTRAMUSCULAR | Status: AC
  Filled 2020-11-21: qty 120

## 2020-11-21 MED ORDER — OXYCODONE HCL 5 MG PO TABS: 5.0000 mg | ORAL_TABLET | Freq: Once | ORAL | Status: DC | PRN

## 2020-11-21 MED ORDER — CEFAZOLIN SODIUM-DEXTROSE 2-4 GM/100ML-% IV SOLN
2.0000 g | INTRAVENOUS | Status: AC
  Administered 2020-11-21: 2 g via INTRAVENOUS

## 2020-11-21 MED ORDER — LIDOCAINE HCL (CARDIAC) PF 100 MG/5ML IV SOSY
PREFILLED_SYRINGE | INTRAVENOUS | Status: DC | PRN
  Administered 2020-11-21: 80 mg via INTRAVENOUS

## 2020-11-21 MED ORDER — LIDOCAINE 2% (20 MG/ML) 5 ML SYRINGE
INTRAMUSCULAR | Status: AC
  Filled 2020-11-21: qty 5

## 2020-11-21 MED ORDER — DEXAMETHASONE SODIUM PHOSPHATE 10 MG/ML IJ SOLN
INTRAMUSCULAR | Status: AC
  Filled 2020-11-21: qty 1

## 2020-11-21 MED ORDER — SUGAMMADEX SODIUM 200 MG/2ML IV SOLN
INTRAVENOUS | Status: DC | PRN
  Administered 2020-11-21: 200 mg via INTRAVENOUS

## 2020-11-21 MED ORDER — EPINEPHRINE PF 1 MG/ML IJ SOLN
INTRAMUSCULAR | Status: DC | PRN
  Administered 2020-11-21: 2 mg

## 2020-11-21 MED ORDER — MEPERIDINE HCL 25 MG/ML IJ SOLN
6.2500 mg | INTRAMUSCULAR | Status: DC | PRN
Start: 2020-11-21 — End: 2020-11-21

## 2020-11-21 MED ORDER — PROPOFOL 10 MG/ML IV BOLUS
INTRAVENOUS | Status: AC
  Filled 2020-11-21: qty 40

## 2020-11-21 MED ORDER — OXYCODONE HCL 5 MG/5ML PO SOLN: 5.0000 mg | Freq: Once | ORAL | Status: DC | PRN

## 2020-11-21 MED ORDER — ROCURONIUM BROMIDE 100 MG/10ML IV SOLN
INTRAVENOUS | Status: DC | PRN
  Administered 2020-11-21: 90 mg via INTRAVENOUS

## 2020-11-21 MED ORDER — FENTANYL CITRATE (PF) 100 MCG/2ML IJ SOLN
INTRAMUSCULAR | Status: DC | PRN
  Administered 2020-11-21: 25 ug via INTRAVENOUS
  Administered 2020-11-21: 100 ug via INTRAVENOUS

## 2020-11-21 MED ORDER — BUPIVACAINE HCL (PF) 0.25 % IJ SOLN
INTRAMUSCULAR | Status: DC | PRN
  Administered 2020-11-21: 100 mL

## 2020-11-21 MED ORDER — ONDANSETRON HCL 4 MG/2ML IJ SOLN
INTRAMUSCULAR | Status: AC
  Filled 2020-11-21: qty 2

## 2020-11-21 MED ORDER — EPINEPHRINE PF 1 MG/ML IJ SOLN
INTRAMUSCULAR | Status: AC
  Filled 2020-11-21: qty 4

## 2020-11-21 MED ORDER — PROMETHAZINE HCL 25 MG/ML IJ SOLN: 6.2500 mg | INTRAMUSCULAR | Status: DC | PRN

## 2020-11-21 MED ORDER — LACTATED RINGERS IV SOLN
INTRAVENOUS | Status: AC | PRN
  Administered 2020-11-21: 2000 mL via INTRAVENOUS

## 2020-11-21 MED ORDER — PROPOFOL 500 MG/50ML IV EMUL
INTRAVENOUS | Status: DC | PRN
Start: 2020-11-21 — End: 2020-11-21
  Administered 2020-11-21: 25 ug/kg/min via INTRAVENOUS

## 2020-11-21 SURGICAL SUPPLY — 61 items
APL PRP STRL LF DISP 70% ISPRP (MISCELLANEOUS) ×2
APL SKNCLS STERI-STRIP NONHPOA (GAUZE/BANDAGES/DRESSINGS) ×3
BAG DECANTER FOR FLEXI CONT (MISCELLANEOUS) IMPLANT
BENZOIN TINCTURE PRP APPL 2/3 (GAUZE/BANDAGES/DRESSINGS) ×6 IMPLANT
BLADE SURG 10 STRL SS (BLADE) ×8 IMPLANT
BLADE SURG 15 STRL LF DISP TIS (BLADE) IMPLANT
BLADE SURG 15 STRL SS (BLADE)
BNDG ELASTIC 6X5.8 VLCR STR LF (GAUZE/BANDAGES/DRESSINGS) ×4 IMPLANT
CANISTER SUCT 1200ML W/VALVE (MISCELLANEOUS) ×2 IMPLANT
CHLORAPREP W/TINT 26 (MISCELLANEOUS) ×4 IMPLANT
CLIP VESOCCLUDE MED 6/CT (CLIP) IMPLANT
COVER BACK TABLE 60X90IN (DRAPES) ×2 IMPLANT
COVER MAYO STAND STRL (DRAPES) ×2 IMPLANT
COVER WAND RF STERILE (DRAPES) IMPLANT
DECANTER SPIKE VIAL GLASS SM (MISCELLANEOUS) IMPLANT
DRAPE LAPAROSCOPIC ABDOMINAL (DRAPES) ×2 IMPLANT
DRAPE UTILITY XL STRL (DRAPES) ×2 IMPLANT
DRSG PAD ABDOMINAL 8X10 ST (GAUZE/BANDAGES/DRESSINGS) ×4 IMPLANT
ELECT REM PT RETURN 9FT ADLT (ELECTROSURGICAL) ×2
ELECTRODE REM PT RTRN 9FT ADLT (ELECTROSURGICAL) ×1 IMPLANT
EVACUATOR SILICONE 100CC (DRAIN) IMPLANT
GAUZE SPONGE 4X4 12PLY STRL (GAUZE/BANDAGES/DRESSINGS) ×4 IMPLANT
GAUZE XEROFORM 5X9 LF (GAUZE/BANDAGES/DRESSINGS) IMPLANT
GLOVE SRG 8 PF TXTR STRL LF DI (GLOVE) IMPLANT
GLOVE SURG UNDER POLY LF SZ8 (GLOVE)
GOWN STRL REUS W/ TWL LRG LVL3 (GOWN DISPOSABLE) ×3 IMPLANT
GOWN STRL REUS W/TWL LRG LVL3 (GOWN DISPOSABLE) ×6
MARKER SKIN DUAL TIP RULER LAB (MISCELLANEOUS) IMPLANT
NDL SAFETY ECLIPSE 18X1.5 (NEEDLE) ×1 IMPLANT
NEEDLE FILTER BLUNT 18X 1/2SAF (NEEDLE) ×1
NEEDLE FILTER BLUNT 18X1 1/2 (NEEDLE) ×1 IMPLANT
NEEDLE HYPO 18GX1.5 SHARP (NEEDLE) ×2
NEEDLE HYPO 25X1 1.5 SAFETY (NEEDLE) IMPLANT
NEEDLE SPNL 18GX3.5 QUINCKE PK (NEEDLE) ×2 IMPLANT
NS IRRIG 1000ML POUR BTL (IV SOLUTION) ×2 IMPLANT
PACK BASIN DAY SURGERY FS (CUSTOM PROCEDURE TRAY) ×2 IMPLANT
PENCIL SMOKE EVACUATOR (MISCELLANEOUS) ×2 IMPLANT
PIN SAFETY STERILE (MISCELLANEOUS) IMPLANT
SHEET MEDIUM DRAPE 40X70 STRL (DRAPES) IMPLANT
SLEEVE SCD COMPRESS KNEE MED (STOCKING) ×2 IMPLANT
SPONGE LAP 18X18 RF (DISPOSABLE) ×6 IMPLANT
STAPLER INSORB 30 2030 C-SECTI (MISCELLANEOUS) ×4 IMPLANT
STAPLER VISISTAT 35W (STAPLE) ×2 IMPLANT
STRIP SUTURE WOUND CLOSURE 1/2 (MISCELLANEOUS) ×6 IMPLANT
SUT CHROMIC 4 0 PS 2 18 (SUTURE) IMPLANT
SUT ETHILON 2 0 FS 18 (SUTURE) IMPLANT
SUT ETHILON 3 0 PS 1 (SUTURE) IMPLANT
SUT MNCRL AB 4-0 PS2 18 (SUTURE) ×4 IMPLANT
SUT PDS 3-0 CT2 (SUTURE) ×4
SUT PDS II 3-0 CT2 27 ABS (SUTURE) ×2 IMPLANT
SUT VIC AB 3-0 PS1 18 (SUTURE)
SUT VIC AB 3-0 PS1 18XBRD (SUTURE) IMPLANT
SUT VLOC 180 P-14 24 (SUTURE) ×4 IMPLANT
SYR 50ML LL SCALE MARK (SYRINGE) IMPLANT
SYR BULB IRRIG 60ML STRL (SYRINGE) ×2 IMPLANT
TAPE MEASURE VINYL STERILE (MISCELLANEOUS) IMPLANT
TOWEL GREEN STERILE FF (TOWEL DISPOSABLE) ×4 IMPLANT
TUBE CONNECTING 20X1/4 (TUBING) ×2 IMPLANT
TUBING INFILTRATION IT-10001 (TUBING) ×2 IMPLANT
UNDERPAD 30X36 HEAVY ABSORB (UNDERPADS AND DIAPERS) ×4 IMPLANT
YANKAUER SUCT BULB TIP NO VENT (SUCTIONS) ×2 IMPLANT

## 2020-11-21 NOTE — Discharge Instructions (Signed)
Activity As tolerated: NO showers for 3 days No heavy activities  Diet: Regular  Wound Care: Keep dressing clean & dry for 3 days.  Keep wrap applied with compression as much as possible.    Do not change dressings for 3 days unless soiled.  Can change if needed but make sure to reapply wrap. After three days can remove wrap and shower.  Then reapply dressings if needed and continue compression with wrap or soft sports bra. Call doctor if any unusual problems occur such as pain, excessive bleeding, unrelieved nausea/vomiting, fever &/or chills  Follow-up appointment: Scheduled for next week.   Post Anesthesia Home Care Instructions  Activity: Get plenty of rest for the remainder of the day. A responsible individual must stay with you for 24 hours following the procedure.  For the next 24 hours, DO NOT: -Drive a car -Operate machinery -Drink alcoholic beverages -Take any medication unless instructed by your physician -Make any legal decisions or sign important papers.  Meals: Start with liquid foods such as gelatin or soup. Progress to regular foods as tolerated. Avoid greasy, spicy, heavy foods. If nausea and/or vomiting occur, drink only clear liquids until the nausea and/or vomiting subsides. Call your physician if vomiting continues.  Special Instructions/Symptoms: Your throat may feel dry or sore from the anesthesia or the breathing tube placed in your throat during surgery. If this causes discomfort, gargle with warm salt water. The discomfort should disappear within 24 hours.  If you had a scopolamine patch placed behind your ear for the management of post- operative nausea and/or vomiting:  1. The medication in the patch is effective for 72 hours, after which it should be removed.  Wrap patch in a tissue and discard in the trash. Wash hands thoroughly with soap and water. 2. You may remove the patch earlier than 72 hours if you experience unpleasant side effects which may  include dry mouth, dizziness or visual disturbances. 3. Avoid touching the patch. Wash your hands with soap and water after contact with the patch.     

## 2020-11-21 NOTE — Transfer of Care (Signed)
Immediate Anesthesia Transfer of Care Note  Patient: Tema Alire  Procedure(s) Performed: MAMMARY REDUCTION  (BREAST) (Bilateral Breast)  Patient Location: PACU  Anesthesia Type:General  Level of Consciousness: awake, alert  and oriented  Airway & Oxygen Therapy: Patient Spontanous Breathing and Patient connected to face mask oxygen  Post-op Assessment: Report given to RN and Post -op Vital signs reviewed and stable  Post vital signs: Reviewed and stable  Last Vitals:  Vitals Value Taken Time  BP 105/47 11/21/20 0930  Temp    Pulse 70 11/21/20 0932  Resp 14 11/21/20 0932  SpO2 100 % 11/21/20 0932  Vitals shown include unvalidated device data.  Last Pain:  Vitals:   11/21/20 0720  TempSrc: Oral  PainSc: 0-No pain         Complications: No complications documented.

## 2020-11-21 NOTE — Interval H&P Note (Signed)
History and Physical Interval Note:  11/21/2020 7:26 AM  Denise Arias  has presented today for surgery, with the diagnosis of macromastia.  The various methods of treatment have been discussed with the patient and family. After consideration of risks, benefits and other options for treatment, the patient has consented to  Procedure(s) with comments: MAMMARY REDUCTION  (BREAST) (Bilateral) - 2 hours as a surgical intervention.  The patient's history has been reviewed, patient examined, no change in status, stable for surgery.  I have reviewed the patient's chart and labs.  Questions were answered to the patient's satisfaction.     [name]

## 2020-11-21 NOTE — Anesthesia Preprocedure Evaluation (Signed)
Anesthesia Evaluation  Patient identified by MRN, date of birth, ID band Patient awake    Reviewed: Allergy & Precautions, H&P , NPO status , Patient's Chart, lab work & pertinent test results  Airway Mallampati: I  TM Distance: >3 FB Neck ROM: full    Dental no notable dental hx.    Pulmonary neg pulmonary ROS,    Pulmonary exam normal breath sounds clear to auscultation       Cardiovascular negative cardio ROS Normal cardiovascular exam Rhythm:Regular Rate:Normal     Neuro/Psych  Headaches, Depression negative psych ROS   GI/Hepatic Neg liver ROS, GERD  ,  Endo/Other  negative endocrine ROS  Renal/GU negative Renal ROS     Musculoskeletal   Abdominal (+) + obese,   Peds  Hematology negative hematology ROS (+)   Anesthesia Other Findings   Reproductive/Obstetrics                             Anesthesia Physical  Anesthesia Plan  ASA: II  Anesthesia Plan: General   Post-op Pain Management:    Induction: Intravenous  PONV Risk Score and Plan: 3 and Ondansetron, Dexamethasone, Midazolam and Treatment may vary due to age or medical condition  Airway Management Planned: Oral ETT  Additional Equipment:   Intra-op Plan:   Post-operative Plan: Extubation in OR  Informed Consent: I have reviewed the patients History and Physical, chart, labs and discussed the procedure including the risks, benefits and alternatives for the proposed anesthesia with the patient or authorized representative who has indicated his/her understanding and acceptance.     Dental advisory given  Plan Discussed with: CRNA  Anesthesia Plan Comments:         Anesthesia Quick Evaluation

## 2020-11-21 NOTE — Anesthesia Postprocedure Evaluation (Signed)
Anesthesia Post Note  Patient: Denise Arias  Procedure(s) Performed: MAMMARY REDUCTION  (BREAST) (Bilateral Breast)     Patient location during evaluation: PACU Anesthesia Type: General Level of consciousness: awake and alert Pain management: pain level controlled Vital Signs Assessment: post-procedure vital signs reviewed and stable Respiratory status: spontaneous breathing, nonlabored ventilation and respiratory function stable Cardiovascular status: blood pressure returned to baseline and stable Postop Assessment: no apparent nausea or vomiting Anesthetic complications: no   No complications documented.  Last Vitals:  Vitals:   11/21/20 1030 11/21/20 1120  BP: 120/77 116/71  Pulse: 71 71  Resp: 18 18  Temp:  37.1 C  SpO2: 96% 97%    Last Pain:  Vitals:   11/21/20 1120  TempSrc:   PainSc: 3                  Lowella Curb

## 2020-11-21 NOTE — Brief Op Note (Signed)
11/21/2020  9:19 AM  PATIENT:  Denise Arias  40 y.o. female  PRE-OPERATIVE DIAGNOSIS:  macromastia  POST-OPERATIVE DIAGNOSIS:  macromastia  PROCEDURE:  Procedure(s) with comments: MAMMARY REDUCTION  (BREAST) (Bilateral) - 2 hours  SURGEON:  Surgeon(s) and Role:    * Terita Hejl, Wendy Poet, MD - Primary  PHYSICIAN ASSISTANT: Enedina Finner, RNFA  ASSISTANTS: none   ANESTHESIA:   general  EBL:  150 mL   BLOOD ADMINISTERED:none  DRAINS: none   LOCAL MEDICATIONS USED:  MARCAINE     SPECIMEN:  Source of Specimen:  r and l breast tissue  DISPOSITION OF SPECIMEN:  PATHOLOGY  COUNTS:  YES  TOURNIQUET:  * No tourniquets in log *  DICTATION: .Dragon Dictation  PLAN OF CARE: Discharge to home after PACU  PATIENT DISPOSITION:  PACU - hemodynamically stable.   Delay start of Pharmacological VTE agent (>24hrs) due to surgical blood loss or risk of bleeding: not applicable

## 2020-11-21 NOTE — Op Note (Signed)
Operative Note   DATE OF OPERATION: 11/21/2020  LOCATION: [facility]   SURGICAL DEPARTMENT: Plastic Surgery  PREOPERATIVE DIAGNOSES: Bilateral symptomatic macromastia.  POSTOPERATIVE DIAGNOSES:  same  PROCEDURE: Bilateral breast reduction with superomedial pedicle.  SURGEON: Ancil Linsey, MD  ASSISTANT: Enedina Finner, RNFA The advanced practice practitioner (APP) assisted throughout the case.  The APP was essential in retraction and counter traction when needed to make the case progress smoothly.  This retraction and assistance made it possible to see the tissue plans for the procedure.  The assistance was needed for blood control, tissue re-approximation and assisted with closure of the incision site.  ANESTHESIA: General.  COMPLICATIONS: None.   INDICATIONS FOR PROCEDURE:  The patient, Denise Arias is a 40 y.o. female born on 01/29/1981, is here for treatment of bilateral symptomatic macromastia. MRN: [id number]  CONSENT:  Informed consent was obtained directly from the patient. Risks, benefits and alternatives were fully discussed. Specific risks including but not limited to bleeding, infection, hematoma, seroma, scarring, pain, infection, contracture, asymmetry, wound healing problems, and need for further surgery were all discussed. The patient did have an ample opportunity to have questions answered to satisfaction.   DESCRIPTION OF PROCEDURE:  The patient was marked preoperatively for a Wise pattern skin excision.  The patient was taken to the operating room. SCDs were placed and antibiotics were given. General anesthesia was administered.The patient's operative site was prepped and draped in a sterile fashion. A time out was performed and all information was confirmed to be correct.  Right Breast: The breast was infiltrated with tumescent solution to help with hemostasis.  The nipple was marked with a cookie cutter.  A superomedial pedicle was drawn out  with the base of at least 8 cm in size.  A breast tourniquet was then applied and the pedicle was de-epithelialized.  Breast tourniquet was then let down and all incisions were made with a 10 blade.  The pedicle was then isolated down to the chest wall with cautery and the excision was performed removing tissue primarily inferiorly and laterally.  Hemostasis was obtained and the wound was stapled closed.  Left breast:  The breast was infiltrated with tumescent solution to help with hemostasis.  The nipple was marked with a cookie cutter.  A superomedial pedicle was drawn out with the base of at least 8 cm in size.  A breast tourniquet was then applied and the pedicle was de-epithelialized.  Breast tourniquet was then let down and all incisions were made with a 10 blade.  The pedicle was then isolated down to the chest wall with cautery and the excision was performed removing tissue primarily inferiorly and laterally.  Hemostasis was obtained and the wound was stapled closed.  Patient was then set up to check for size and symmetry.  Minor modifications were made.  This resulted in a total of 795g removed from the right side and 994g removed from the left side.  The inframammary incision was closed with a combination of buried in-sorb staples and a running 3-0 Quill suture.  The vertical and periareolar limbs were closed with interrupted buried 4-0 Monocryl and a running 4-0 Quill suture.  Steri-Strips were then applied along with a soft dressing and Ace wrap.  The patient tolerated the procedure well.  There were no complications. The patient was allowed to wake from anesthesia, extubated and taken to the recovery room in satisfactory condition.  I was present for the entire procedure.

## 2020-11-21 NOTE — Anesthesia Procedure Notes (Signed)
Procedure Name: Intubation Performed by: Dock Baccam M, CRNA Pre-anesthesia Checklist: Patient identified, Emergency Drugs available, Suction available and Patient being monitored Patient Re-evaluated:Patient Re-evaluated prior to induction Oxygen Delivery Method: Circle system utilized Preoxygenation: Pre-oxygenation with 100% oxygen Induction Type: IV induction Ventilation: Mask ventilation without difficulty Laryngoscope Size: Mac and 3 Grade View: Grade I Tube type: Oral Tube size: 7.0 mm Number of attempts: 1 Airway Equipment and Method: Stylet and Oral airway Placement Confirmation: ETT inserted through vocal cords under direct vision,  positive ETCO2,  breath sounds checked- equal and bilateral and CO2 detector Secured at: 20 cm Tube secured with: Tape Dental Injury: Teeth and Oropharynx as per pre-operative assessment        

## 2020-11-22 ENCOUNTER — Encounter (HOSPITAL_BASED_OUTPATIENT_CLINIC_OR_DEPARTMENT_OTHER): Payer: Self-pay | Admitting: Plastic Surgery

## 2020-11-22 LAB — SURGICAL PATHOLOGY

## 2020-11-27 ENCOUNTER — Telehealth: Payer: Self-pay

## 2020-11-27 NOTE — Telephone Encounter (Signed)
Patient called to say that she had surgery last Tuesday and would like to know if she can shower everyday.  Patient also said that her wrap is really tight and would like to know if she can loosen it a little bit.  Please call.

## 2020-11-27 NOTE — Telephone Encounter (Signed)
Returned patients call. Advised she can take a shower after 3 days from her surgery last Tuesday. Avoid direct contact with shower head to the breast and incisions. May place non fragrance soap on shoulders and let it run down her body. Change soiled dressings as needed with clean ones, rewrap ace bandage, and keep on 24/7 unless taking a shower. Follow up appointment is 11/29/20 with Dr. Arita Miss 10:15am Wednesday. Patient understood and agreed with plan.

## 2020-11-29 ENCOUNTER — Telehealth: Payer: Self-pay

## 2020-11-29 ENCOUNTER — Ambulatory Visit (INDEPENDENT_AMBULATORY_CARE_PROVIDER_SITE_OTHER): Payer: 59 | Admitting: Plastic Surgery

## 2020-11-29 ENCOUNTER — Encounter: Payer: Self-pay | Admitting: Plastic Surgery

## 2020-11-29 ENCOUNTER — Other Ambulatory Visit: Payer: Self-pay

## 2020-11-29 VITALS — BP 122/82 | HR 69

## 2020-11-29 DIAGNOSIS — N62 Hypertrophy of breast: Secondary | ICD-10-CM

## 2020-11-29 NOTE — Telephone Encounter (Signed)
Returned patients call. Per [place], she is able to sleep on her side after one more week. Patient understood and agreed with plan.

## 2020-11-29 NOTE — Telephone Encounter (Signed)
Patient called to ask if she can sleep on her right side or left side at night instead of continuing to sleep on her back.  Please call.

## 2020-11-29 NOTE — Progress Notes (Signed)
Patient presents 1 week postop from bilateral breast reduction.  She overall feels good and is very happy with the appearance.  On exam everything looks to be healing fine with no signs of skin compromise.  The incisions are intact and nipple areolar complexes are viable.  I do not detect any subcutaneous fluid.  Of asked her to continue to wear supportive garment and continue to avoid strenuous activities  Patient brought up again abdominal contouring.  We had evaluated her for abdominoplasty in the past and I think she is a reasonable candidate for this we will plan to provide a quote for her.  Examination of her abdomen shows moderate excess skin and adipose tissue in the supra and infraumbilical areas.  I do not appreciate any hernias.  I do not see any obvious large scars.

## 2020-12-01 ENCOUNTER — Other Ambulatory Visit: Payer: Self-pay | Admitting: Neurology

## 2020-12-06 ENCOUNTER — Encounter: Payer: 59 | Admitting: Plastic Surgery

## 2020-12-08 ENCOUNTER — Telehealth: Payer: Self-pay

## 2020-12-08 NOTE — Telephone Encounter (Signed)
Patient called to state she has some dark brown drainage from the left breast that she first noticed 7 days ago. She denies any fever or chills. She would like to know if this is normal and what she can use to wrap the area.

## 2020-12-11 ENCOUNTER — Telehealth: Payer: Self-pay

## 2020-12-11 NOTE — Telephone Encounter (Signed)
See previous note. Spouse call was returned.

## 2020-12-11 NOTE — Telephone Encounter (Signed)
Patient's husband called back to say that they need to hear from someone regarding their question on Friday.  They also would like to have instructions on how the patient can wash and what material they need to be using for the dressing. Please call.

## 2020-12-11 NOTE — Telephone Encounter (Signed)
Returned husbands call. He indicated she had tiny spot under left breast at the IMF, less than 1 cm that had dark brown to clear drainage on the incision area. She has no fever, chills, nausea, nor vomiting. Advised she can use Vaseline under her breast and cover with a 4x4 guaze. She can send a photo via MyChart for further observation. She needs to continue with compression 24/7 with an ace wrap or sports bra.

## 2020-12-14 ENCOUNTER — Telehealth: Payer: Self-pay

## 2020-12-14 ENCOUNTER — Encounter: Payer: Self-pay | Admitting: Surgical

## 2020-12-14 NOTE — Progress Notes (Signed)
Patient is 3 weeks post breast reduction, she called the on-call provider service this evening stating that she noticed a dime sized opening of her breast and some drainage. Reports that she is otherwise feeling well. She's not having any fevers or chills. She would like to know what to do in regards to wound care. I discussed with the patient and her family member that I would recommend Vaseline and gauze on the wound twice daily. She  can change the gauze as needed based on drainage. I offered an appointment next week for reevaluation. Reviewed signs and symptoms indicating infection and recommend patient contract clinic with any questions or concerns.

## 2020-12-14 NOTE — Telephone Encounter (Signed)
Patient's husband called to say that patient is experiencing a leak where her stitches are crossing.  He said that the patient has a small hole in that area and liquid is coming out.  He also said that the patient is experiencing a lot of pain in the same area.  He requested that we please call him back today.

## 2020-12-14 NOTE — Telephone Encounter (Signed)
Returned husbands call. LMVM,  Advised she can use Vaseline under her breast and cover with a 4x4 guaze. For pain she can alternate with Ibuprofen and tylenol. I will have someone from the front desk call and schedule an appointment to be seen sooner than her next appointment 01/04/2021

## 2020-12-20 ENCOUNTER — Ambulatory Visit (INDEPENDENT_AMBULATORY_CARE_PROVIDER_SITE_OTHER): Payer: 59

## 2020-12-20 ENCOUNTER — Other Ambulatory Visit: Payer: Self-pay

## 2020-12-20 VITALS — BP 128/84 | HR 70 | Temp 98.1°F | Ht 65.0 in | Wt 186.0 lb

## 2020-12-20 DIAGNOSIS — N62 Hypertrophy of breast: Secondary | ICD-10-CM

## 2021-01-04 ENCOUNTER — Ambulatory Visit (INDEPENDENT_AMBULATORY_CARE_PROVIDER_SITE_OTHER): Payer: 59 | Admitting: Plastic Surgery

## 2021-01-04 ENCOUNTER — Other Ambulatory Visit: Payer: Self-pay

## 2021-01-04 DIAGNOSIS — N62 Hypertrophy of breast: Secondary | ICD-10-CM

## 2021-01-04 NOTE — Patient Instructions (Signed)
Patient will use vaseline/gauze Continue to wear sports bra or wrap F/U with Dr. Arita Miss at next visit

## 2021-01-04 NOTE — Progress Notes (Signed)
12/20/20- Pt in office for evaluation of the following symptoms: Increased drainage which she describes as "clear"   The small open areas noted at the IMF/vertical limb site of bilateral breast incisions were superficial.  Left >Right She is having pain 4/10 bilateral breast- equal She denies any chills/fever or N/V There was minimal drainage at the time of visit- & I was unable to express any fluid bilaterally.  She is concerned with the swelling/shape- but there is no marked asymmetry. We discussed the fact that swelling & abnormal shaped breast is normal after this procedure. I informed her that once the swelling and surgical reaction subsides, the breast will begin to relax & become more round & softer. Photos taken & entered into chart for review. I instructed her to use vaseline/gauze for the superficial wounds & keep her f/u with Dr. Arita Miss in May She will call for any changes or concerns. She will alternate OTC Ibuprofen\Tylenol if needed for pain

## 2021-01-04 NOTE — Progress Notes (Signed)
Patient is 6 weeks postop from bilateral breast reduction.  She feels good and is happy and just has a few spitting sutures.  On exam her incisions are healed nicely she has good shape size and symmetry.  A few spitting sutures were removed but the remainder of her incisions are smooth and look to be doing well.  At this point she does not need to wear any particular type of bra and does not need compression anymore.  We will have her follow-up on an as-needed basis.  All of her questions were answered.

## 2021-01-05 ENCOUNTER — Telehealth: Payer: Self-pay | Admitting: Internal Medicine

## 2021-01-05 NOTE — Telephone Encounter (Signed)
Will forward to provider  

## 2021-01-05 NOTE — Telephone Encounter (Signed)
Returned husband call and provided provider response pt doesn't have any questions or concerns

## 2021-01-05 NOTE — Telephone Encounter (Signed)
Copied from CRM [id number]. Topic: General - Inquiry >> Jan 05, 2021  2:29 PM Daphine Deutscher D wrote: Reason for CRM: Pt's husband called saying Duloxetine is making her head and stomach hurt Please advise    [phone]

## 2021-01-09 ENCOUNTER — Other Ambulatory Visit: Payer: Self-pay | Admitting: Internal Medicine

## 2021-01-09 DIAGNOSIS — G8929 Other chronic pain: Secondary | ICD-10-CM

## 2021-01-17 ENCOUNTER — Ambulatory Visit (INDEPENDENT_AMBULATORY_CARE_PROVIDER_SITE_OTHER): Payer: 59 | Admitting: Surgical

## 2021-01-17 ENCOUNTER — Other Ambulatory Visit: Payer: Self-pay

## 2021-01-17 ENCOUNTER — Ambulatory Visit
Admission: RE | Admit: 2021-01-17 | Discharge: 2021-01-17 | Disposition: A | Discharge status: Home / Self Care | Payer: 59 | Source: Ambulatory Visit | Attending: Cardiovascular Disease | Admitting: Cardiovascular Disease

## 2021-01-17 ENCOUNTER — Other Ambulatory Visit: Payer: Self-pay | Admitting: Cardiovascular Disease

## 2021-01-17 DIAGNOSIS — N62 Hypertrophy of breast: Secondary | ICD-10-CM

## 2021-01-17 DIAGNOSIS — M542 Cervicalgia: Secondary | ICD-10-CM

## 2021-01-17 NOTE — Progress Notes (Signed)
Patient is a 40 year old female here for follow-up after bilateral breast reduction with Dr. Arita Miss on 11/21/2020.  She is approximately 2 months postop.  She presented to the office today with concern for infection and increased pain of her left breast.  She is not having any fevers or chills or nausea or vomiting.  She also has some questions about scar creams.  Chaperone present on exam On exam bilateral NAC's are viable.  Bilateral breast incisions are healing nicely.  She does have a localized area of erythema along the left lateral breast incision and what appears to be some mild localized swelling.  There is no fluctuance noted.  No cellulitic changes noted.  No foul odor noted.  With some light palpation I was able to express some serosanguineous fluid from a small opening where a dissolvable staple was protruding through the incision.  There is also a spitting suture along the inframammary fold and vertical limb junction.  Recommend Vaseline and gauze to the small opening on the left breast wound where the fluid was expressed.  I discussed with the patient that this was likely a reaction to the absorbable suture or staples.  The rest of her incisions are healing well and there is no sign of overt infection through the entire breast.  She can begin using scar cream at this point on all the incisions except for the location where she has the small opening.  I discussed with her that this will likely heal up in the next few days to week.  I recommend she call with any questions or concerns or if her symptoms worsen or she notices any changes that concern her.

## 2021-01-25 ENCOUNTER — Telehealth: Payer: Self-pay

## 2021-01-25 NOTE — Telephone Encounter (Signed)
Patient called to say that she has an infection on her left side.  Patient said sometimes liquid comes out and sometimes it bleeds.  Please call.

## 2021-01-25 NOTE — Telephone Encounter (Signed)
Returned patients call. The area that was observed on 01/17/2021 is still open, draining yellowish red fluid. Denies any odor, fever, chills, nausea, nor vomiting. She also indicated she has a new area that has not quite opened yet. Advised her to apply both areas with vaseline and cover with guaze. Will have front office call her back with the first available appointment. Patient understood and agreed with plan.

## 2021-01-31 ENCOUNTER — Telehealth: Payer: Self-pay | Admitting: Internal Medicine

## 2021-01-31 NOTE — Telephone Encounter (Signed)
Copied from CRM [id number]. Topic: Appointment Scheduling - Scheduling Inquiry for Clinic >> Jan 25, 2021 12:57 PM Daphine Deutscher D wrote: Reason for CRM: Pt called saying she has an appt 03/08/21 but she said she needed to come in sooner than that.  She said she is having anxiety and was told they would be sending her medication to the pharmacy but she would like to see th doctor first.  CB#  [phone] >> Jan 30, 2021  4:21 PM Pawlus, Maxine Glenn A wrote: Pt was following up on her previous message, pts appt is on the waitlist please advise if you can get her in any sooner.    Called patient and LVM advising her I was calling from [facility] in regards to an appointment. Was going to offer mobile bus location and hours for patient to be seen prior to 03/08/21 or would an 8:10 or 1:30 slot be appropriate for patient? Please advise and I can reach back out to patient.

## 2021-02-01 NOTE — Telephone Encounter (Signed)
Pt has been scheduled a virtual appt with Dr. Laural Benes for 02/02/21 at 130pm

## 2021-02-02 ENCOUNTER — Other Ambulatory Visit: Payer: Self-pay

## 2021-02-02 ENCOUNTER — Ambulatory Visit: Payer: 59 | Attending: Internal Medicine | Admitting: Internal Medicine

## 2021-02-02 DIAGNOSIS — R519 Headache, unspecified: Secondary | ICD-10-CM
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
## 2021-02-06 ENCOUNTER — Other Ambulatory Visit: Payer: Self-pay

## 2021-02-06 ENCOUNTER — Encounter: Payer: Self-pay | Admitting: Surgical

## 2021-02-06 ENCOUNTER — Ambulatory Visit (INDEPENDENT_AMBULATORY_CARE_PROVIDER_SITE_OTHER): Payer: 59 | Admitting: Surgical

## 2021-02-06 DIAGNOSIS — Z9889 Other specified postprocedural states: Secondary | ICD-10-CM

## 2021-02-06 DIAGNOSIS — N62 Hypertrophy of breast: Secondary | ICD-10-CM

## 2021-02-06 NOTE — Progress Notes (Signed)
Patient is a 40 year old female here for follow-up after bilateral breast reduction with Dr. Arita Miss on 11/21/2020.  She presents today for evaluation of a new wound on her left breast.  She is not having any fevers, chills, nausea, vomiting.  She reports otherwise she is feeling as if things are going well.  She reports occasional tenderness of bilateral breast.  Chaperone present on exam On exam bilateral NAC's are viable.  Left breast lateral incision has some superficial epithelium that is slightly blistered and appears reactive.  There is no cellulitic changes or surrounding erythema.  Suspect likely related to the dissolving sutures causing some irritation.  There is no purulent drainage.  It is minimally tender to palpation.  Right breast incisions are intact and well-healed.  Recommend Vaseline gauze to left breast wound daily.  Recommend following up in 3 to 4 weeks for reevaluation.  Picture was taken and placed in the patient's chart with patient's permission.  There is no sign of infection, seroma, hematoma.

## 2021-03-07 ENCOUNTER — Ambulatory Visit (INDEPENDENT_AMBULATORY_CARE_PROVIDER_SITE_OTHER): Payer: 59 | Admitting: Surgical

## 2021-03-07 ENCOUNTER — Encounter: Payer: Self-pay | Admitting: Surgical

## 2021-03-07 ENCOUNTER — Other Ambulatory Visit: Payer: Self-pay

## 2021-03-07 DIAGNOSIS — N62 Hypertrophy of breast: Secondary | ICD-10-CM

## 2021-03-07 DIAGNOSIS — M546 Pain in thoracic spine: Secondary | ICD-10-CM

## 2021-03-07 DIAGNOSIS — Z9889 Other specified postprocedural states: Secondary | ICD-10-CM | POA: Diagnosis not present

## 2021-03-07 DIAGNOSIS — M545 Low back pain, unspecified: Secondary | ICD-10-CM

## 2021-03-07 NOTE — Progress Notes (Signed)
   Subjective:     Patient ID: Denise Arias, female    DOB: 11-27-1980, 40 y.o.   MRN: [id number]  Chief Complaint  Patient presents with   Follow-up    HPI: The patient is a 40 y.o. female here for follow-up after bilateral breast reduction with Dr. Arita Miss on 11/21/2020.  She was last seen in the office on 02/06/2021 with concerns of a new wound of her left breast.   She reports that she is doing well.  She reports that the left breast wound has healed up.  She reports that she is having a little bit of pain at night but does not report anything significant.  She reports that she feels as if she has a stitch protruding on the right breast that is bothersome for her.  She also has some questions about nipple areola sensation.   Review of Systems  Constitutional: Negative.   Respiratory: Negative.    Cardiovascular: Negative.   Skin: Negative.     Objective:   Vital Signs There were no vitals taken for this visit. Vital Signs and Nursing Note Reviewed Chaperone present Physical Exam Constitutional:      General: She is not in acute distress.    Appearance: Normal appearance. She is not ill-appearing.  HENT:     Head: Normocephalic and atraumatic.  Chest:     Comments: Bilateral breast incisions are intact, bilateral NAC's are viable.  No wounds are noted.  No erythema noted.  No subcutaneous fluid collections noted with palpation. Skin:    General: Skin is warm and dry.  Neurological:     Mental Status: She is alert.  Psychiatric:        Mood and Affect: Mood normal.        Behavior: Behavior normal.      Assessment/Plan:     ICD-10-CM   1. Macromastia  N62     2. S/P bilateral breast reduction  Z98.890     3. Back pain of thoracolumbar region  M54.50    M54.6       Patient is a 40 year old female here for follow-up after bilateral breast reduction approximately 3 months ago.  She is overall doing well.  She had a wound in the left breast that has healed  nicely.  She had a protruding stitch on the right vertical limb that we removed today, patient tolerated this fine.  Vaseline and gauze was placed over this area.  We discussed that nipple areola sensation may return over the next few months up to 1 year post surgery.  We did discuss that sensation may return stronger weaker or no sensation may return.  There is no sign of infection, seroma, hematoma.  Recommend following up on an as-needed basis. Recommend calling with questions or concerns.   Picture was taken and placed in the patient's chart with patient's permission   Leslee Home, PA-C 03/07/2021, 2:15 PM

## 2021-03-08 ENCOUNTER — Ambulatory Visit: Payer: 59

## 2021-03-08 ENCOUNTER — Ambulatory Visit: Payer: 59 | Attending: Internal Medicine | Admitting: Internal Medicine

## 2021-03-08 VITALS — BP 123/83 | HR 70 | Resp 16 | Wt 186.8 lb

## 2021-03-08 DIAGNOSIS — K029 Dental caries, unspecified: Secondary | ICD-10-CM | POA: Diagnosis not present

## 2021-03-08 DIAGNOSIS — R519 Headache, unspecified: Secondary | ICD-10-CM | POA: Diagnosis not present

## 2021-03-08 DIAGNOSIS — M26609 Unspecified temporomandibular joint disorder, unspecified side: Secondary | ICD-10-CM

## 2021-03-08 MED ORDER — CYCLOBENZAPRINE HCL 5 MG PO TABS: 5.0000 mg | ORAL_TABLET | Freq: Every day | ORAL | 0 refills | Status: DC | PRN
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
## 2021-04-02 ENCOUNTER — Institutional Professional Consult (permissible substitution): Payer: 59 | Admitting: Neurology

## 2021-04-16 ENCOUNTER — Institutional Professional Consult (permissible substitution): Payer: 59 | Admitting: Neurology

## 2021-06-19 ENCOUNTER — Ambulatory Visit: Payer: 59 | Admitting: Physician Assistant

## 2021-06-25 ENCOUNTER — Other Ambulatory Visit: Payer: Self-pay | Admitting: Cardiovascular Disease

## 2021-06-25 ENCOUNTER — Ambulatory Visit
Admission: RE | Admit: 2021-06-25 | Discharge: 2021-06-25 | Disposition: A | Discharge status: Home / Self Care | Payer: 59 | Source: Ambulatory Visit | Attending: Cardiovascular Disease | Admitting: Cardiovascular Disease

## 2021-06-25 DIAGNOSIS — R109 Unspecified abdominal pain: Secondary | ICD-10-CM

## 2021-07-25 ENCOUNTER — Telehealth: Payer: Self-pay | Admitting: Plastic Surgery

## 2021-07-25 NOTE — Telephone Encounter (Signed)
Not Available - Tried to contact patient at her personal phone number and her sister's to schedule a follow-up appointment for concerns regarding past bilateral breast reduction.   Patient came into office and expressed concerns of tenderness in her left breast. Spoke with Dr. Noe Gens and planned to reach out to patient to schedule follow up appointment.  Was not able to leave message on either phone due to full voicemail box and patients personal phone being disconnected.

## 2021-10-12 ENCOUNTER — Ambulatory Visit: Payer: Self-pay

## 2021-10-12 NOTE — Telephone Encounter (Signed)
Contacted pt and scheduled a virtual appt with Tonya for 2/13 at 120pm. After appt was confirmed made pt aware that if abdominal pain gets worse she will need to be seen at the ED or Urgent Care. Pt states she understands. Pt then stated she wants to schedule an appt with provider made pt next available appt for the afternoon per pt request. Pt then requested to have virtual appt canceled. Virtual appt was canceled and have made pt aware that if her abdominal pain gets worse to go to the ER or the Urgent Care

## 2021-10-12 NOTE — Telephone Encounter (Signed)
°  Chief Complaint: abdominal pain Symptoms: lower abdominal pain  Frequency: 2 days Pertinent Negatives: Patient denies N/V/D Disposition: [] ED /[x] Urgent Care (no appt availability in office) / [] Appointment(In office/virtual)/ []  [place] [facility]/ [] Home Care/ [] Refused Recommended Disposition /[] [facility]/ []  Follow-up with PCP Additional Notes: Pt was asking about going to [facility] and advised to call and see if she needed referral from our office. Pt was in an office somewhere and stated she would call right back.    Reason for Disposition  [1] MILD-MODERATE pain AND [2] constant AND [3] present > 2 hours  Answer Assessment - Initial Assessment Questions 1. LOCATION: "Where does it hurt?"      Lower part  2. RADIATION: "Does the pain shoot anywhere else?" (e.g., chest, back)     No 3. ONSET: "When did the pain begin?" (e.g., minutes, hours or days ago)      yesterday 4. SUDDEN: "Gradual or sudden onset?"     sudden 5. PATTERN "Does the pain come and go, or is it constant?"    - If constant: "Is it getting better, staying the same, or worsening?"      (Note: Constant means the pain never goes away completely; most serious pain is constant and it progresses)     - If intermittent: "How long does it last?" "Do you have pain now?"     (Note: Intermittent means the pain goes away completely between bouts)     constant 6. SEVERITY: "How bad is the pain?"  (e.g., Scale 1-10; mild, moderate, or severe)   - MILD (1-3): doesn't interfere with normal activities, abdomen soft and not tender to touch    - MODERATE (4-7): interferes with normal activities or awakens from sleep, abdomen tender to touch    - SEVERE (8-10): excruciating pain, doubled over, unable to do any normal activities      6 10. OTHER SYMPTOMS: "Do you have any other symptoms?" (e.g., back pain, diarrhea, fever, urination pain, vomiting)       no  Protocols used: Abdominal Pain -  Female-A-AH

## 2021-10-12 NOTE — Telephone Encounter (Signed)
FYI

## 2021-10-15 ENCOUNTER — Telehealth: Payer: Self-pay | Admitting: Nurse Practitioner

## 2021-12-07 ENCOUNTER — Ambulatory Visit: Payer: Self-pay | Admitting: Internal Medicine

## 2021-12-11 ENCOUNTER — Ambulatory Visit: Payer: Self-pay | Admitting: Internal Medicine

## 2021-12-30 ENCOUNTER — Other Ambulatory Visit: Payer: Self-pay | Admitting: Internal Medicine

## 2022-01-11 ENCOUNTER — Telehealth: Payer: Self-pay | Admitting: Internal Medicine

## 2022-01-11 NOTE — Telephone Encounter (Signed)
Copied from CRM [id number]. Topic: General - Other ?>> Jan 11, 2022 12:22 PM Gaetana Michaelis A wrote: ?Reason for CRM: The patient has called to request orders for an MRI  ? ?Please contact further if needed ?

## 2022-01-14 NOTE — Telephone Encounter (Signed)
Returned pt call pt didn't answer husband answered phone. Pt husband states he is not home. Made pt husband aware that pt will need an appt to discuss MRI. Made husband aware that pt does have an appt on 6/7 and she will need to keep that appt to discuss any concerns she may has, Pt husband states he understands and doesn't have any questions or concerns  ?

## 2022-02-06 ENCOUNTER — Ambulatory Visit: Payer: Self-pay | Admitting: Physician Assistant

## 2022-02-07 ENCOUNTER — Ambulatory Visit: Payer: Self-pay | Attending: Internal Medicine | Admitting: Physician Assistant

## 2022-02-07 ENCOUNTER — Other Ambulatory Visit: Payer: Self-pay

## 2022-02-07 ENCOUNTER — Encounter: Payer: Self-pay | Admitting: Physician Assistant

## 2022-02-07 VITALS — BP 117/79 | HR 79 | Ht 64.0 in | Wt 198.0 lb

## 2022-02-07 DIAGNOSIS — R519 Headache, unspecified: Secondary | ICD-10-CM

## 2022-02-07 DIAGNOSIS — H6983 Other specified disorders of Eustachian tube, bilateral: Secondary | ICD-10-CM

## 2022-02-07 DIAGNOSIS — J3089 Other allergic rhinitis: Secondary | ICD-10-CM

## 2022-02-07 DIAGNOSIS — M62838 Other muscle spasm: Secondary | ICD-10-CM

## 2022-02-07 MED ORDER — CETIRIZINE HCL 10 MG PO TABS
10.0000 mg | ORAL_TABLET | Freq: Every day | ORAL | 1 refills | Status: DC
  Filled 2022-02-07: qty 100, 100d supply, fill #0

## 2022-02-07 MED ORDER — METHOCARBAMOL 500 MG PO TABS
ORAL_TABLET | ORAL | 1 refills | Status: DC
Start: 2022-02-07 — End: 2022-06-18
  Filled 2022-02-07: qty 90, 15d supply, fill #0

## 2022-02-07 MED ORDER — FLUTICASONE PROPIONATE 50 MCG/ACT NA SUSP
2.0000 | Freq: Every day | NASAL | 6 refills | Status: DC
  Filled 2022-02-07: qty 16, 30d supply, fill #0
  Filled 2022-04-15: qty 16, 30d supply, fill #1

## 2022-02-07 NOTE — Patient Instructions (Signed)
Muscle Cramps and Spasms Muscle cramps and spasms are when muscles tighten by themselves. They usually get better within minutes. Muscle cramps are painful. They are usually stronger and last longer than muscle spasms. Muscle spasms may or may not be painful. They can last a few seconds or much longer. Cramps and spasms can affect any muscle, but they occur most often in the calf muscles of the leg. They are usually not caused by a serious problem. In many cases, the cause is not known. Some common causes include: Doing more physical work or exercise than your body is ready for. Using the muscles too much (overuse) by repeating certain movements too many times. Staying in a certain position for a long time. Playing a sport or doing an activity without preparing properly. Using bad form or technique while playing a sport or doing an activity. Not having enough water in your body (dehydration). Injury. Side effects of some medicines. Low levels of the salts and minerals in your blood (electrolytes), such as low potassium or calcium. Follow these instructions at home: Managing pain and stiffness     Massage, stretch, and relax the muscle. Do this for many minutes at a time. If told, put heat on tight or tense muscles as often as told by your doctor. Use the heat source that your doctor recommends, such as a moist heat pack or a heating pad. Place a towel between your skin and the heat source. Leave the heat on for 20-30 minutes. Remove the heat if your skin turns bright red. This is very important if you are not able to feel pain, heat, or cold. You may have a greater risk of getting burned. If told, put ice on the affected area. This may help if you are sore or have pain after a cramp or spasm. Put ice in a plastic bag. Place a towel between your skin and the bag. Leave the ice on for 20 minutes, 2-3 times a day. Try taking hot showers or baths to help relax tight muscles. Eating and  drinking Drink enough fluid to keep your pee (urine) pale yellow. Eat a healthy diet to help ensure that your muscles work well. This should include: Fruits and vegetables. Lean protein. Whole grains. Low-fat or nonfat dairy products. General instructions If you are having cramps often, avoid intense exercise for several days. Take over-the-counter and prescription medicines only as told by your doctor. Watch for any changes in your symptoms. Keep all follow-up visits as told by your doctor. This is important. Contact a doctor if: Your cramps or spasms get worse or happen more often. Your cramps or spasms do not get better with time. Summary Muscle cramps and spasms are when muscles tighten by themselves. They usually get better within minutes. Cramps and spasms occur most often in the calf muscles of the leg. Massage, stretch, and relax the muscle. This may help the cramp or spasm go away. Drink enough fluid to keep your pee (urine) pale yellow. This information is not intended to replace advice given to you by your health care provider. Make sure you discuss any questions you have with your health care provider. Document Revised: 03/09/2021 Document Reviewed: 03/09/2021 Elsevier Patient Education  2023 Else[employer] Inc[employer]  Eustachian Tube Dysfunction  Eustachian tube dysfunction refers to a condition in which a blockage develops in the narrow passage that connects the middle ear to the back of the nose (eustachian tube). The eustachian tube regulates air pressure in the middle ear  by letting air move between the ear and nose. It also helps to drain fluid from the middle ear space. Eustachian tube dysfunction can affect one or both ears. When the eustachian tube does not function properly, air pressure, fluid, or both can build up in the middle ear. What are the causes? This condition occurs when the eustachian tube becomes blocked or cannot open normally. Common causes of this condition  include: Ear infections. Colds and other infections that affect the nose, mouth, and throat (upper respiratory tract). Allergies. Irritation from cigarette smoke. Irritation from stomach acid coming up into the esophagus (gastroesophageal reflux). The esophagus is the part of the body that moves food from the mouth to the stomach. Sudden changes in air pressure, such as from descending in an airplane or scuba diving. Abnormal growths in the nose or throat, such as: Growths that line the nose (nasal polyps). Abnormal growth of cells (tumors). Enlarged tissue at the back of the throat (adenoids). What increases the risk? You are more likely to develop this condition if: You smoke. You are overweight. You are a child who has: Certain birth defects of the mouth, such as cleft palate. Large tonsils or adenoids. What are the signs or symptoms? Common symptoms of this condition include: A feeling of fullness in the ear. Ear pain. Clicking or popping noises in the ear. Ringing in the ear (tinnitus). Hearing loss. Loss of balance. Dizziness. Symptoms may get worse when the air pressure around you changes, such as when you travel to an area of high elevation, fly on an airplane, or go scuba diving. How is this diagnosed? This condition may be diagnosed based on: Your symptoms. A physical exam of your ears, nose, and throat. Tests, such as those that measure: The movement of your eardrum. Your hearing (audiometry). How is this treated? Treatment depends on the cause and severity of your condition. In mild cases, you may relieve your symptoms by moving air into your ears. This is called "popping the ears." In more severe cases, or if you have symptoms of fluid in your ears, treatment may include: Medicines to relieve congestion (decongestants). Medicines that treat allergies (antihistamines). Nasal sprays or ear drops that contain medicines that reduce swelling (steroids). A procedure  to drain the fluid in your eardrum. In this procedure, a small tube may be placed in the eardrum to: Drain the fluid. Restore the air in the middle ear space. A procedure to insert a balloon device through the nose to inflate the opening of the eustachian tube (balloon dilation). Follow these instructions at home: Lifestyle Do not do any of the following until your health care provider approves: Travel to high altitudes. Fly in airplanes. Work in a Estate agent or room. Scuba dive. Do not use any products that contain nicotine or tobacco. These products include cigarettes, chewing tobacco, and vaping devices, such as e-cigarettes. If you need help quitting, ask your health care provider. Keep your ears dry. Wear fitted earplugs during showering and bathing. Dry your ears completely after. General instructions Take over-the-counter and prescription medicines only as told by your health care provider. Use techniques to help pop your ears as recommended by your health care provider. These may include: Chewing gum. Yawning. Frequent, forceful swallowing. Closing your mouth, holding your nose closed, and gently blowing as if you are trying to blow air out of your nose. Keep all follow-up visits. This is important. Contact a health care provider if: Your symptoms do not go away after  treatment. Your symptoms come back after treatment. You are unable to pop your ears. You have: A fever. Pain in your ear. Pain in your head or neck. Fluid draining from your ear. Your hearing suddenly changes. You become very dizzy. You lose your balance. Get help right away if: You have a sudden, severe increase in any of your symptoms. Summary Eustachian tube dysfunction refers to a condition in which a blockage develops in the eustachian tube. It can be caused by ear infections, allergies, inhaled irritants, or abnormal growths in the nose or throat. Symptoms may include ear pain or fullness,  hearing loss, or ringing in the ears. Mild cases are treated with techniques to unblock the ears, such as yawning or chewing gum. More severe cases are treated with medicines or procedures. This information is not intended to replace advice given to you by your health care provider. Make sure you discuss any questions you have with your health care provider. Document Revised: 10/30/2020 Document Reviewed: 10/30/2020 Elsevier Patient Education  2023 [employer].

## 2022-02-07 NOTE — Progress Notes (Signed)
Patient ID: Denise Arias, female   DOB: 1980/09/18, 41 y.o.   MRN: [id number]   Denise Arias, is a 41 y.o. female  [id number]:[id number]  [id number]  DOB - [birth year]  Chief Complaint  Patient presents with   Neck Pain   Shoulder Pain   Headache       Subjective:   Denise Arias is a 41 y.o. female here today for continued issues relating to HA and muscle spasm.  The flexeril she was prescribed makes her feel funny.  No changes or new symptoms but I did ask her to review all of the symptoms with me.  She took a friend's zyrtec which seemed to help some.  She has nasal congestion, ear congestion, the roof of her mouth itches at times.  No ST.  No fever.  She also has pain and tightness in the L trapezius area that radiates to the top of her scalp at times. No thunderclap.  The symptoms occur most days.  She has had these issues since the covid vaccine.  Her partner is with her and combined, no interpreter is needed  No problems updated.  ALLERGIES: Allergies  Allergen Reactions   Tizanidine     Causes eyes to turn red.   Tramadol Other (See Comments)    "Heart works fast" Eye become red   Amoxicillin Nausea And Vomiting   Banana Hives   Fish Oil Rash   Minocycline Rash and Hives    Rash per patient report.   Multivitamins Other (See Comments)    sd her stomach/intestinal area swelled up after 2 hrs.   Strawberry Extract Rash   Vitamin C Rash    PAST MEDICAL HISTORY: Past Medical History:  Diagnosis Date   Allergy    RHINITIS   GERD (gastroesophageal reflux disease)    Neck pain    Palpitations     MEDICATIONS AT HOME: Prior to Admission medications   Medication Sig Start Date End Date Taking? Authorizing Provider  cetirizine (ZYRTEC) 10 MG tablet Take 1 tablet (10 mg total) by mouth at bedtime. 02/07/22  Yes Kalissa Grays M, PA-C  fluticasone (FLONASE) 50 MCG/ACT nasal spray Place 2 sprays into both nostrils daily. 02/07/22  Yes Georgian Co M, PA-C  methocarbamol  (ROBAXIN) 500 MG tablet 1 to 2 tablets every 8 hours as needed for muscle spasm 02/07/22  Yes Lynleigh Kovack M, PA-C    ROS:  Neg resp Neg cardiac Neg GI Neg GU Neg MS Neg psych  Objective:   Vitals:   02/07/22 1638  BP: 117/79  Pulse: 79  SpO2: 98%  Weight: 198 lb (89.8 kg)  Height: 5\' 4"  (1.626 m)   Exam General appearance : Awake, alert, not in any distress. Speech Clear. Not toxic looking HEENT: Atraumatic and Normocephalic, pupils equally reactive to light and accomodation, EOM intact.  B turbinates enlarged and boggy.  B TM full and congested without erythema.  Mouth WNL.   Neck: Supple, no JVD. No cervical lymphadenopathy.  Chest: Good air entry bilaterally, CTAB.  No rales/rhonchi/wheezing CVS: S1 S2 regular, no murmurs.  Extremities: B/L Lower Ext shows no edema, both legs are warm to touch.  There is tender spasm in the L trapezius Neurology: Awake alert, and oriented X 3, CN II-XII intact, Non focal Skin: No Rash  Data Review Lab Results  Component Value Date   HGBA1C 6.1 (H) 02/21/2020    Assessment & Plan   1. Frequent headaches Likely combination sinus/muscle spasm - methocarbamol (ROBAXIN) 500  MG tablet; 1 to 2 tablets every 8 hours as needed for muscle spasm  Dispense: 90 tablet; Refill: 1 - fluticasone (FLONASE) 50 MCG/ACT nasal spray; Place 2 sprays into both nostrils daily.  Dispense: 16 g; Refill: 6  2. Dysfunction of both eustachian tubes - fluticasone (FLONASE) 50 MCG/ACT nasal spray; Place 2 sprays into both nostrils daily.  Dispense: 16 g; Refill: 6 - cetirizine (ZYRTEC) 10 MG tablet; Take 1 tablet (10 mg total) by mouth at bedtime.  Dispense: 100 tablet; Refill: 1  3. Other allergic rhinitis - fluticasone (FLONASE) 50 MCG/ACT nasal spray; Place 2 sprays into both nostrils daily.  Dispense: 16 g; Refill: 6 - cetirizine (ZYRTEC) 10 MG tablet; Take 1 tablet (10 mg total) by mouth at bedtime.  Dispense: 100 tablet; Refill: 1  4. Muscle  spasm Stretching/ice/heat/massage - methocarbamol (ROBAXIN) 500 MG tablet; 1 to 2 tablets every 8 hours as needed for muscle spasm  Dispense: 90 tablet; Refill: 1    Return for 6 to 8 weeks to see me to recheck headaches and muscle spasms.  The patient was given clear instructions to go to ER or return to medical center if symptoms don't improve, worsen or new problems develop. The patient verbalized understanding. The patient was told to call to get lab results if they haven't heard anything in the next week.      Georgian Co, PA-C [facility] [place], [place] [phone]   02/07/2022, 5:12 PM

## 2022-03-22 ENCOUNTER — Ambulatory Visit: Payer: Self-pay

## 2022-03-22 NOTE — Telephone Encounter (Signed)
Pt called, was advised ok to continue taking Zyrtec and Flonase which allergy season still being present until next OV. Pt verbalized understanding and didn't need further assistance.   Summary: medication continuation  question   Pt was advised by Georgian Co to use Zyrtec and Flonase for 6 -8 weeks / pt has an appt with her in August and wants to know should she continue the medications until her appt or stop since its been the 6-8 weeks / please advise      Reason for Disposition  Caller has medicine question only, adult not sick, AND triager answers question  Protocols used: Medication Question Call-A-AH

## 2022-03-25 ENCOUNTER — Ambulatory Visit: Payer: Self-pay | Admitting: Internal Medicine

## 2022-04-15 ENCOUNTER — Other Ambulatory Visit: Payer: Self-pay

## 2022-04-18 ENCOUNTER — Ambulatory Visit: Payer: Medicaid Other | Attending: Internal Medicine | Admitting: Physician Assistant

## 2022-04-18 VITALS — BP 120/84 | HR 70 | Temp 98.8°F | Resp 16 | Ht 64.0 in | Wt 198.0 lb

## 2022-04-18 DIAGNOSIS — R7303 Prediabetes: Secondary | ICD-10-CM | POA: Diagnosis not present

## 2022-04-18 DIAGNOSIS — R519 Headache, unspecified: Secondary | ICD-10-CM

## 2022-04-18 DIAGNOSIS — H6993 Unspecified Eustachian tube disorder, bilateral: Secondary | ICD-10-CM

## 2022-04-18 DIAGNOSIS — Z1239 Encounter for other screening for malignant neoplasm of breast: Secondary | ICD-10-CM

## 2022-04-18 DIAGNOSIS — H6983 Other specified disorders of Eustachian tube, bilateral: Secondary | ICD-10-CM

## 2022-04-18 DIAGNOSIS — R635 Abnormal weight gain: Secondary | ICD-10-CM

## 2022-04-18 DIAGNOSIS — J3089 Other allergic rhinitis: Secondary | ICD-10-CM

## 2022-04-18 DIAGNOSIS — N6081 Other benign mammary dysplasias of right breast: Secondary | ICD-10-CM

## 2022-04-18 NOTE — Patient Instructions (Signed)
Resources requested for family member    [facility] [address], [place], [place] [address] [phone] or [phone] Walk-in urgent care 24/7 for anyone  For [facility] ONLY New patient assessments and therapy walk-ins: Monday and Wednesday 8am-11am First and second Friday 1pm-5pm New patient psychiatry and medication management walk-ins: Mondays, Wednesdays, Thursdays, Fridays 8am-11am No psychiatry walk-ins on Tuesday      [facility] [address] [place], [place] [address] [phone] or [phone] Walk-in urgent care 24/7 for anyone  For [facility] ONLY New patient assessments and therapy walk-ins: Monday and Wednesday 8am-11am First and second Friday 1pm-5pm New patient psychiatry and medication management walk-ins: Mondays, Wednesdays, Thursdays, Fridays 8am-11am No psychiatry walk-ins on Tuesday   *Accepts all insurance and uninsured for Urgent Care needs *Accepts Medicaid and uninsured for outpatient treatment   [facility] (Therapy and psychiatry) [facility] (near [employer]) [address] [place], [place] [address] [phone] Fax: [phone] (INSURANCE REQUIRED-MEDICAID ACCEPTED)   [facility] at [facility] [address] [place] [address] [place],  [place]  [address] [phone] Call for appointment  [facility] [place] (Therapy only)  [facility] [address] [place], [place] [address] Monday - Friday: 8:30 a.m.-12 p.m. / 1 p.m.-2:30 p.m.  [facility] [address] [place], [place] [address] Monday-Friday: 8:30 a.m.-12 p.m. / 2-3:30 p.m. (INSURANCE REQUIRED -MEDICAID ACCEPTED) They do offer a sliding fee scale $20-$30/session   [facility] [address] [place], [place] [address] Phone: [phone]  [facility] [address] [place] [address]  Phone: [phone] (Does not accept Medicaid) (only one provider accepts Medicare) [facility] [address] (at [employer]) [address] [place], [place] [address] (Accepts Medicaid and Medicare)  [facility]  [address] [place] [address]  [place], [place] [address]  Phone: [phone]  [address] [place], [place] [address] Phone: [phone] [facility] Medicaid) [facility] (Therapy only)  [address], [place], [place] [address] Phone: [phone]   [facility] (Therapy only)  [address] [place], [place] [address] Phone: [phone] [facility])   [facility] [address] [place], [place] [address] Phone: [phone] (Accepts  Health Choice) [facility] [phone] [place] Condon [place], [place] [address] Phone: [phone] [facility]) [facility] (Psychiatry only)  [phone] [address], [place], [place] [address] (Accepts Medicaid and Medicare) [facility] (Psychiatry and therapy)  [address] Lonell Grandchild [place], [place] [address] [phone] [facility] Medicare) [facility] (psychiatry and therapy) [address], [place], [place] [address] [phone]  [facility] [address], [place], [place] [address] [phone] Accept [address] [place], [place], [place], [place],  and the following types of Medicaid; Alliance, [place], Partners, [place], [place] Health Choice, [employer], [employer] [place], [place] [employer], and [employer], as well as offering a Careers adviser and private payment options. Provides In-Office Appointments, Virtual Appointments, and Phone Consultations Offers medication management for ages 42 years old and up, including,  Medication Management for Suboxone and  [facility] [phone] [address], [place], [place] [address] (Accepts Medicaid and Medicare)         19.  [facility] (therapy only)  [address] [place], [place] [address]            [phone] (Accepts medicare) 20. [facility]  (Suboxone and methodone) [phone] [address], [place], [place] [address] To Be Eligible for Opioid Treatment at [facility] you must be at least 43 years of age you have already tried other interventions that were not successful such as opioid detox, inpatient rehab for opioids, or outpatient counseling specifically for opioid dependency your [facility] drug test must be completely free of benzodiazepines (klonopin, xanax, valium, ativan, or other benz) you have reliable transportation to the [facility] clinic in [place] you recognize that counseling is a critical component of [facility]' Opioid Program and you agree to attend all required counseling sessions you are committed to total drug abstinence and will conscientiously strive to remain free of alcohol, marijuana, and other illicit substances while in treatment you desire a peaceful treatment atmosphere in which personal responsibility and respect toward staff and clients is the norm   21. [facility] [address] [employer], [place], [place] [address] Offers SAIOP (Substance Abuse Intensive Outpatient Program) [phone] 22. [facility] [address] [place], [place] [address] [phone] (Accepts medicare)  For those who are tech savvy, go on psychology today, type in your local city (i.e. [place]. [place]) and specify your insurance at the top of the screen after you search. (Medicaid if needed). You can also specify whether you are interested in therapy and psychiatry.  [email or web address]/us

## 2022-04-18 NOTE — Progress Notes (Signed)
Denise Arias, is a 41 y.o. female  BZJ:[id number]  OFB:[id number]  DOB - 04-17-1981  Chief Complaint  Patient presents with   Follow-up    Allergies nasal and ear, meds help, if she stops using it the pain returns Trouble sleeping after stopping medicine Reports no pain        Subjective:   Denise Arias is a 41 y.o. female here today for f/up HAs.  HAs much improved/almost resolved using flonase and zyrtec.  They come back if she goes several days without using it.    She has  a "pimple" on her R breast.    Needs mammogram.    She is also concerned about mid section weight gain, but weight is same as last visit in June.  She has gained ~10 punds over the last year  No problems updated.  ALLERGIES: Allergies  Allergen Reactions   Tizanidine     Causes eyes to turn red.   Tramadol Other (See Comments)    "Heart works fast" Eye become red   Amoxicillin Nausea And Vomiting   Banana Hives   Fish Oil Rash   Minocycline Rash and Hives    Rash per patient report.   Multivitamins Other (See Comments)    sd her stomach/intestinal area swelled up after 2 hrs.   Strawberry Extract Rash   Vitamin C Rash    PAST MEDICAL HISTORY: Past Medical History:  Diagnosis Date   Allergy    RHINITIS   GERD (gastroesophageal reflux disease)    Neck pain    Palpitations     MEDICATIONS AT HOME: Prior to Admission medications   Medication Sig Start Date End Date Taking? Authorizing Provider  cetirizine (ZYRTEC) 10 MG tablet Take 1 tablet (10 mg total) by mouth at bedtime. 02/07/22  Yes Caterina Racine M, PA-C  fluticasone (FLONASE) 50 MCG/ACT nasal spray Place 2 sprays into both nostrils daily. 02/07/22  Yes Jasline Buskirk M, PA-C  methocarbamol (ROBAXIN) 500 MG tablet 1 to 2 tablets every 8 hours as needed for muscle spasm 02/07/22  Yes Jaymir Struble M, PA-C    ROS: Neg HEENT Neg resp Neg cardiac Neg GI Neg GU Neg MS Neg psych Neg neuro  Objective:   Vitals:    04/18/22 1627  BP: 120/84  Pulse: 70  Resp: 16  Temp: 98.8 F (37.1 C)  SpO2: 98%  Weight: 198 lb (89.8 kg)  Height: 5\' 4"  (1.626 m)   Exam General appearance : Awake, alert, not in any distress. Speech Clear. Not toxic looking HEENT: Atraumatic and Normocephalic Neck: Supple, no JVD. No cervical lymphadenopathy.  Chest: Good air entry bilaterally, CTAB.  No rales/rhonchi/wheezing CVS: S1 S2 regular, no murmurs.  R breast-superior aspect of nipple there is a superficial sebaceous cyst that is not inflamed.  <1cm. Puncta visible.   Extremities: B/L Lower Ext shows no edema, both legs are warm to touch Neurology: Awake alert, and oriented X 3, CN II-XII intact, Non focal Skin: No Rash  Data Review Lab Results  Component Value Date   HGBA1C 6.0 (H) 04/18/2022   HGBA1C 6.1 (H) 02/21/2020    Assessment & Plan   1. Frequent headaches Much improved/resolved on flonase and zyrtec - CBC with Differential/Platelet - Thyroid Panel With TSH - Comprehensive metabolic panel  2. Dysfunction of both eustachian tubes Continue flonase  3. Other allergic rhinitis Continue zyrtec  4. Prediabetes I have had a lengthy discussion and provided education about insulin resistance and the intake of  too much sugar/refined carbohydrates.  I have advised the patient to work at a goal of eliminating sugary drinks, candy, desserts, sweets, refined sugars, processed foods, and white carbohydrates.  The patient expresses understanding.   - Hemoglobin A1c  5. Encounter for screening for malignant neoplasm of breast, unspecified screening modality - MM Digital Screening; Future  6. Weight gain Checking labs  7. Sebaceous cyst of breast, right Not inflames - Ambulatory referral to General Surgery    Return if symptoms worsen or fail to improve.  The patient was given clear instructions to go to ER or return to medical center if symptoms don't improve, worsen or new problems develop. The  patient verbalized understanding. The patient was told to call to get lab results if they haven't heard anything in the next week.      Georgian Co, PA-C [facility] [place], [place] [phone]   04/19/2022, 1:28 PM Patient ID: Denise Arias, female   DOB: August 03, 1981, 41 y.o.   MRN: [id number]

## 2022-04-19 LAB — COMPREHENSIVE METABOLIC PANEL
ALT: 14 IU/L (ref 0–32)
AST: 14 IU/L (ref 0–40)
Albumin/Globulin Ratio: 1.4 (ref 1.2–2.2)
Albumin: 4.1 g/dL (ref 3.9–4.9)
Alkaline Phosphatase: 63 IU/L (ref 44–121)
BUN/Creatinine Ratio: 19 (ref 9–23)
BUN: 13 mg/dL (ref 6–24)
Bilirubin Total: <0.2 mg/dL (ref 0.0–1.2)
CO2: 22 mmol/L (ref 20–29)
Calcium: 9.1 mg/dL (ref 8.7–10.2)
Chloride: 102 mmol/L (ref 96–106)
Creatinine, Ser: 0.69 mg/dL (ref 0.57–1.00)
Globulin, Total: 2.9 g/dL (ref 1.5–4.5)
Glucose: 125 mg/dL — ABNORMAL HIGH (ref 70–99)
Potassium: 4 mmol/L (ref 3.5–5.2)
Sodium: 137 mmol/L (ref 134–144)
Total Protein: 7 g/dL (ref 6.0–8.5)
eGFR: 110 mL/min/{1.73_m2} (ref >59)

## 2022-04-19 LAB — CBC WITH DIFFERENTIAL/PLATELET
Basophils Absolute: 0 10*3/uL (ref 0.0–0.2)
Basos: 1 %
EOS (ABSOLUTE): 0.1 10*3/uL (ref 0.0–0.4)
Eos: 2 %
Hematocrit: 36.8 % (ref 34.0–46.6)
Hemoglobin: 12.2 g/dL (ref 11.1–15.9)
Immature Grans (Abs): 0 10*3/uL (ref 0.0–0.1)
Immature Granulocytes: 0 %
Lymphocytes Absolute: 1.6 10*3/uL (ref 0.7–3.1)
Lymphs: 21 %
MCH: 28.7 pg (ref 26.6–33.0)
MCHC: 33.2 g/dL (ref 31.5–35.7)
MCV: 87 fL (ref 79–97)
Monocytes Absolute: 0.7 10*3/uL (ref 0.1–0.9)
Monocytes: 10 %
Neutrophils Absolute: 5.1 10*3/uL (ref 1.4–7.0)
Neutrophils: 66 %
Platelets: 235 10*3/uL (ref 150–450)
RBC: 4.25 x10E6/uL (ref 3.77–5.28)
RDW: 13.2 % (ref 11.7–15.4)
WBC: 7.6 10*3/uL (ref 3.4–10.8)

## 2022-04-19 LAB — HEMOGLOBIN A1C
Est. average glucose Bld gHb Est-mCnc: 126 mg/dL
Hgb A1c MFr Bld: 6 % — ABNORMAL HIGH (ref 4.8–5.6)

## 2022-04-19 LAB — THYROID PANEL WITH TSH
Free Thyroxine Index: 1.8 (ref 1.2–4.9)
T3 Uptake Ratio: 25 % (ref 24–39)
T4, Total: 7.2 ug/dL (ref 4.5–12.0)
TSH: 0.703 u[IU]/mL (ref 0.450–4.500)

## 2022-05-07 ENCOUNTER — Ambulatory Visit: Payer: Self-pay

## 2022-05-07 NOTE — Telephone Encounter (Signed)
  Chief Complaint: Abdominal pain Symptoms: Pain above and around belly button Frequency: 2 days Pertinent Negatives: Patient denies Vomiting, fever Disposition: [] ED /[x] Urgent Care (no appt availability in office) / [] Appointment(In office/virtual)/ []  [place] [facility]/ [] Home Care/ [] Refused Recommended Disposition /[] [place] [facility]/ []  Follow-up with PCP Additional Notes: Pt started with abdominal pain 2 days ago. Pain is constant  and 5/10 on pain scale.  Reason for Disposition  [1] MILD-MODERATE pain AND [2] constant AND [3] present > 2 hours  Answer Assessment - Initial Assessment Questions 1. LOCATION: "Where does it hurt?"      Around belly button and on top 2. RADIATION: "Does the pain shoot anywhere else?" (e.g., chest, back)     no 3. ONSET: "When did the pain begin?" (e.g., minutes, hours or days ago)      2 days ago 4. SUDDEN: "Gradual or sudden onset?"     gradual 5. PATTERN "Does the pain come and go, or is it constant?"    - If it comes and goes: "How long does it last?" "Do you have pain now?"     (Note: Comes and goes means the pain is intermittent. It goes away completely between bouts.)    - If constant: "Is it getting better, staying the same, or getting worse?"      (Note: Constant means the pain never goes away completely; most serious pain is constant and gets worse.)      All the time 6. SEVERITY: "How bad is the pain?"  (e.g., Scale 1-10; mild, moderate, or severe)    - MILD (1-3): Doesn't interfere with normal activities, abdomen soft and not tender to touch.     - MODERATE (4-7): Interferes with normal activities or awakens from sleep, abdomen tender to touch.     - SEVERE (8-10): Excruciating pain, doubled over, unable to do any normal activities.       5/10 7. RECURRENT SYMPTOM: "Have you ever had this type of stomach pain before?" If Yes, ask: "When was the last time?" and "What happened that time?"      no 8. CAUSE: "What do you think is  causing the stomach pain?"     Gaining weight 9. RELIEVING/AGGRAVATING FACTORS: "What makes it better or worse?" (e.g., antacids, bending or twisting motion, bowel movement)     no 10. OTHER SYMPTOMS: "Do you have any other symptoms?" (e.g., back pain, diarrhea, fever, urination pain, vomiting)       no 11. PREGNANCY: "Is there any chance you are pregnant?" "When was your last menstrual period?"       no  Protocols used: Abdominal Pain - [facility]

## 2022-05-08 NOTE — Telephone Encounter (Signed)
Routing to CMA 

## 2022-05-08 NOTE — Telephone Encounter (Signed)
Please advise.----DD,RMA  

## 2022-05-08 NOTE — Telephone Encounter (Signed)
Pt informed of pcp notes to be seen via UC or mobile unit. Pt expressed understanding, stating that she will go to UC.----DD,RMA

## 2022-05-26 ENCOUNTER — Ambulatory Visit (HOSPITAL_COMMUNITY)
Admission: EM | Admit: 2022-05-26 | Discharge: 2022-05-26 | Disposition: A | Discharge status: Home / Self Care | Payer: Medicaid Other | Attending: Internal Medicine | Admitting: Internal Medicine

## 2022-05-26 ENCOUNTER — Encounter (HOSPITAL_COMMUNITY): Payer: Self-pay

## 2022-05-26 DIAGNOSIS — R051 Acute cough: Secondary | ICD-10-CM

## 2022-05-26 DIAGNOSIS — J3089 Other allergic rhinitis: Secondary | ICD-10-CM

## 2022-05-26 DIAGNOSIS — J069 Acute upper respiratory infection, unspecified: Secondary | ICD-10-CM | POA: Diagnosis not present

## 2022-05-26 DIAGNOSIS — H6983 Other specified disorders of Eustachian tube, bilateral: Secondary | ICD-10-CM | POA: Diagnosis not present

## 2022-05-26 MED ORDER — BENZONATATE 100 MG PO CAPS
100.0000 mg | ORAL_CAPSULE | Freq: Three times a day (TID) | ORAL | 0 refills | Status: DC
Start: 2022-05-26 — End: 2022-06-19

## 2022-05-26 MED ORDER — CETIRIZINE HCL 10 MG PO TABS
10.0000 mg | ORAL_TABLET | Freq: Every day | ORAL | 1 refills | Status: DC
Start: 2022-05-26 — End: 2022-09-20

## 2022-05-26 MED ORDER — PROMETHAZINE-DM 6.25-15 MG/5ML PO SYRP: 5.0000 mL | ORAL_SOLUTION | Freq: Every evening | ORAL | 0 refills | Status: DC | PRN

## 2022-05-26 NOTE — ED Triage Notes (Signed)
Pt is here for a cough with nasal drainage , having trouble catching breath, headache  x1wk and fever x2days

## 2022-05-26 NOTE — Discharge Instructions (Signed)
You likely had COVID-19 a couple of weeks ago and are now recovering from this virus.  Take cetirizine once daily to dry up the postnasal drainage is causing your cough. You may begin taking Tessalon Perles every 8 hours during the day for your cough. You may also start taking Promethazine DM cough syrup as needed at bedtime for nighttime cough suppression and sleep.  Only take this at bedtime as this can make you drowsy.  Place a humidifier in your room to blow cool moist air while you are sleeping and prevent further airway aggravation and cough at nighttime.  If you develop any new or worsening symptoms or do not improve in the next 2 to 3 days, please return.  If your symptoms are severe, please go to the emergency room.  Follow-up with your primary care provider for further evaluation and management of your symptoms as well as ongoing wellness visits.  I hope you feel better!

## 2022-05-26 NOTE — ED Provider Notes (Signed)
[facility]    CSN: [id number] Arrival date & time: 05/26/22  1050      History   Chief Complaint Chief Complaint  Patient presents with   Cough    HPI Denise Arias is a 41 y.o. female.   Patient presents urgent care for evaluation of dry cough that started approximately 2 weeks ago along with fever/chills, nausea, vomiting, body aches, nasal congestion, intense headache, and sore throat.  Symptoms have improved significantly over the last week and patient is only experiencing dry cough at this time with mild nasal drainage.  Cough is worse at nighttime and she has not been able to sleep very well due to waking up coughing.  Also reporting some central periumbilical abdominal discomfort after coughing spells that resolves when she stops coughing.  No urinary symptoms, constipation, diarrhea, flank pain, or recent history of antibiotics/abdominal surgeries.  Denies current fever/chills, headache, sore throat, and nausea/vomiting.  She does not have a history of any chronic respiratory problems and has been vaccinated against COVID-19.  Denies shortness of breath, chest pain, weakness, and fatigue.  Her daughter is sick at home with a cough as well but denies other known sick contacts.  She has a history of allergic rhinitis but does not currently take any medications for this.  No attempted use of medications over-the-counter prior to arrival urgent care.   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   Class 2 obesity due to excess calories without serious comorbidity with body mass index (BMI) of 35.0 to 35.9 in adult 02/21/2020   Drug reaction 03/03/2017   Frequent headaches 11/15/2016   Palpitations 11/15/2016   Family history of thyroid disease in sister 11/15/2016   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 Authorizing Provider  benzonatate (TESSALON) 100 MG capsule Take 1 capsule (100 mg total) by mouth every 8 (eight) hours. 05/26/22  Yes Talbot Grumbling, FNP  promethazine-dextromethorphan (PROMETHAZINE-DM) 6.25-15 MG/5ML syrup Take 5 mLs by mouth at  bedtime as needed for cough. 05/26/22  Yes Carlisle Beers, FNP  cetirizine (ZYRTEC) 10 MG tablet Take 1 tablet (10 mg total) by mouth at bedtime. 05/26/22   Carlisle Beers, FNP  fluticasone (FLONASE) 50 MCG/ACT nasal spray Place 2 sprays into both nostrils daily. 02/07/22   Anders Simmonds, PA-C  methocarbamol (ROBAXIN) 500 MG tablet 1 to 2 tablets every 8 hours as needed for muscle spasm 02/07/22   Anders Simmonds, PA-C    Family History Family History  Problem Relation Age of Onset   Diabetes Mother    Hypertension Mother    Other Father        unsure of medical history   Thyroid disease Sister        goiter   Diabetes Sister     Other Brother        murdered   Asthma Maternal Uncle     Social History Social History   Tobacco Use   Smoking status: Never   Smokeless tobacco: Never  Vaping Use   Vaping Use: Never used  Substance Use Topics   Alcohol use: No   Drug use: No     Allergies   Tizanidine, Tramadol, Amoxicillin, Banana, Fish oil, Minocycline, Multivitamins, Strawberry extract, and Vitamin c   Review of Systems Review of Systems  Respiratory:  Positive for cough.   Per HPI   Physical Exam Triage Vital Signs ED Triage Vitals  Enc Vitals Group     BP 05/26/22 1146 121/83     Pulse Rate 05/26/22 1146 67     Resp 05/26/22 1146 12     Temp 05/26/22 1146 98.4 F (36.9 C)     Temp Source 05/26/22 1146 Oral     SpO2 05/26/22 1146 97 %     Weight 05/26/22 1144 198 lb (89.8 kg)     Height 05/26/22 1144 5\' 4"  (1.626 m)     Head Circumference --      Peak Flow --      Pain Score 05/26/22 1143 4     Pain Loc --      Pain Edu? --      Excl. in GC? --    No data found.  Updated Vital Signs BP 121/83 (BP Location: Left Arm)   Pulse 67   Temp 98.4 F (36.9 C) (Oral)   Resp 12   Ht 5\' 4"  (1.626 m)   Wt 198 lb (89.8 kg)   LMP  (LMP Unknown)   SpO2 97%   BMI 33.99 kg/m   Visual Acuity Right Eye Distance:   Left Eye Distance:   Bilateral Distance:    Right Eye Near:   Left Eye Near:    Bilateral Near:     Physical Exam Vitals and nursing note reviewed.  Constitutional:      Appearance: Normal appearance. She is obese. She is not ill-appearing or toxic-appearing.     Comments: Very pleasant patient sitting in position of comfort on exam table in no acute distress.  HENT:     Head: Normocephalic and atraumatic.     Right Ear: Hearing, tympanic membrane, ear canal and external ear normal.     Left Ear: Hearing, tympanic membrane, ear canal and external ear normal.     Nose: Nose normal.     Mouth/Throat:     Lips: Pink.     Mouth: Mucous membranes are moist.     Pharynx: No  oropharyngeal exudate or posterior oropharyngeal erythema.  Comments: No erythema to the posterior oropharynx but there is presence of moderate amount of clear postnasal drainage to the oropharynx. Eyes:     General: Lids are normal. Vision grossly intact. Gaze aligned appropriately.        Right eye: No discharge.        Left eye: No discharge.     Extraocular Movements: Extraocular movements intact.     Conjunctiva/sclera: Conjunctivae normal.     Pupils: Pupils are equal, round, and reactive to light.  Cardiovascular:     Rate and Rhythm: Normal rate and regular rhythm.     Heart sounds: Normal heart sounds, S1 normal and S2 normal.  Pulmonary:     Effort: Pulmonary effort is normal. No respiratory distress.     Breath sounds: Normal breath sounds and air entry.     Comments: Lungs clear and equal to auscultation bilaterally.  No acute respiratory distress. Abdominal:     General: Bowel sounds are normal.     Palpations: Abdomen is soft.     Tenderness: There is no abdominal tenderness. There is no right CVA tenderness, left CVA tenderness or guarding.  Musculoskeletal:     Cervical back: Normal range of motion and neck supple.  Lymphadenopathy:     Cervical: No cervical adenopathy.  Skin:    General: Skin is warm and dry.     Capillary Refill: Capillary refill takes less than 2 seconds.     Findings: No rash.  Neurological:     General: No focal deficit present.     Mental Status: She is alert and oriented to person, place, and time. Mental status is at baseline.     Cranial Nerves: No dysarthria or facial asymmetry.     Motor: No weakness.     Gait: Gait normal.  Psychiatric:        Mood and Affect: Mood normal.        Speech: Speech normal.        Behavior: Behavior normal.        Thought Content: Thought content normal.        Judgment: Judgment normal.      UC Treatments / Results  Labs (all labs ordered are listed, but only abnormal results are  displayed) Labs Reviewed - No data to display  EKG   Radiology No results found.  Procedures Procedures (including critical care time)  Medications Ordered in UC Medications - No data to display  Initial Impression / Assessment and Plan / UC Course  I have reviewed the triage vital signs and the nursing notes.  Pertinent labs & imaging results that were available during my care of the patient were reviewed by me and considered in my medical decision making (see chart for details).   1.  Acute cough and viral upper respiratory tract infection Patient likely had COVID-19 a couple of weeks ago and is now experiencing lingering cough from this.  She does not currently have any infectious signs or symptoms and there is low suspicion for postviral bacterial infection at this time.  Deferred imaging based on stable cardiopulmonary exam findings and hemodynamically stable vital signs in the clinic.  Cough is likely related to lingering postnasal drainage and will likely clear up with daily antihistamine and prescription cough medicine use.  Patient to take Tessalon Perles every 8 hours as needed for coughing during the day and Promethazine DM cough syrup as needed at bedtime.  Advised to take cetirizine once daily ongoing due to  history of allergic rhinitis related to seasonal pollen as this will also help to dry up nasal mucus causing cough.  Patient advised to place a humidifier in her room to help moisten the air and prevent aggravation of airway causing cough.  If symptoms do not improve in the next 2 to 3 days with interventions mentioned above, she has been advised to return to urgent care.  Strict ED return precautions also given.  Follow-up with PCP advised.   Discussed physical exam and available lab work findings in clinic with patient.  Counseled patient regarding appropriate use of medications and potential side effects for all medications recommended or prescribed today. Discussed red  flag signs and symptoms of worsening condition,when to call the PCP office, return to urgent care, and when to seek higher level of care in the emergency department. Patient verbalizes understanding and agreement with plan. All questions answered. Patient discharged in stable condition.   Final Clinical Impressions(s) / UC Diagnoses   Final diagnoses:  Acute cough  Viral upper respiratory tract infection     Discharge Instructions      You likely had COVID-19 a couple of weeks ago and are now recovering from this virus.  Take cetirizine once daily to dry up the postnasal drainage is causing your cough. You may begin taking Tessalon Perles every 8 hours during the day for your cough. You may also start taking Promethazine DM cough syrup as needed at bedtime for nighttime cough suppression and sleep.  Only take this at bedtime as this can make you drowsy.  Place a humidifier in your room to blow cool moist air while you are sleeping and prevent further airway aggravation and cough at nighttime.  If you develop any new or worsening symptoms or do not improve in the next 2 to 3 days, please return.  If your symptoms are severe, please go to the emergency room.  Follow-up with your primary care provider for further evaluation and management of your symptoms as well as ongoing wellness visits.  I hope you feel better!   ED Prescriptions     Medication Sig Dispense Auth. Provider   cetirizine (ZYRTEC) 10 MG tablet Take 1 tablet (10 mg total) by mouth at bedtime. 30 tablet Reita May M, FNP   benzonatate (TESSALON) 100 MG capsule Take 1 capsule (100 mg total) by mouth every 8 (eight) hours. 21 capsule Carlisle Beers, FNP   promethazine-dextromethorphan (PROMETHAZINE-DM) 6.25-15 MG/5ML syrup Take 5 mLs by mouth at bedtime as needed for cough. 118 mL Carlisle Beers, FNP      PDMP not reviewed this encounter.   Carlisle Beers, [place] 05/26/22 1308

## 2022-06-18 ENCOUNTER — Encounter: Payer: Self-pay | Admitting: Nurse Practitioner

## 2022-06-18 ENCOUNTER — Ambulatory Visit: Payer: Medicaid Other | Attending: Nurse Practitioner | Admitting: Nurse Practitioner

## 2022-06-18 VITALS — BP 117/77 | HR 64 | Temp 97.9°F | Ht 64.0 in | Wt 199.0 lb

## 2022-06-18 DIAGNOSIS — R519 Headache, unspecified: Secondary | ICD-10-CM | POA: Diagnosis not present

## 2022-06-18 DIAGNOSIS — M62838 Other muscle spasm: Secondary | ICD-10-CM

## 2022-06-18 DIAGNOSIS — N6081 Other benign mammary dysplasias of right breast: Secondary | ICD-10-CM

## 2022-06-18 DIAGNOSIS — M25519 Pain in unspecified shoulder: Secondary | ICD-10-CM

## 2022-06-18 DIAGNOSIS — M542 Cervicalgia: Secondary | ICD-10-CM

## 2022-06-18 DIAGNOSIS — R109 Unspecified abdominal pain: Secondary | ICD-10-CM | POA: Diagnosis not present

## 2022-06-18 DIAGNOSIS — G8929 Other chronic pain: Secondary | ICD-10-CM

## 2022-06-18 MED ORDER — MELOXICAM 7.5 MG PO TABS: 7.5000 mg | ORAL_TABLET | Freq: Every day | ORAL | 1 refills | Status: DC

## 2022-06-18 MED ORDER — METHOCARBAMOL 500 MG PO TABS: ORAL_TABLET | ORAL | 1 refills | Status: DC

## 2022-06-18 NOTE — Progress Notes (Unsigned)
Assessment & Plan:  Denise Arias was seen today for abdominal pain.  Diagnoses and all orders for this visit:  Chronic abdominal pain -     US Abdomen Complete; Future  Sebaceous cyst of skin of right breast -     Ambulatory referral to General Surgery  Frequent headaches -     methocarbamol (ROBAXIN) 500 MG tablet; 1 to 2 tablets every 8 hours as needed for muscle spasm  Muscle spasm -     methocarbamol (ROBAXIN) 500 MG tablet; 1 to 2 tablets every 8 hours as needed for muscle spasm  Neck and shoulder pain -     methocarbamol (ROBAXIN) 500 MG tablet; 1 to 2 tablets every 8 hours as needed for muscle spasm -     meloxicam (MOBIC) 7.5 MG tablet; Take 1 tablet (7.5 mg total) by mouth daily. For neck and shoulder pain    Patient has been counseled on age-appropriate routine health concerns for screening and prevention. These are reviewed and up-to-date. Referrals have been placed accordingly. Immunizations are up-to-date or declined.    Subjective:   Chief Complaint  Patient presents with   Abdominal Pain   HPI Denise Arias 41 y.o. female presents to office today with complaints of abdominal pain and  between her neck and shoulders.    Pain in neck and shoulders has been ongoing for several months. Unrelated to any injury. Taking tylenol which  helps for a few hours.  She ran of the methocardbamol and will need a refill of this for her pain. Associated symptoms: headaches.     Abdominal Pain: Patient complains of abdominal pain. The pain is described as cramping and sharp, and is 6/10 in intensity. Pain is located in the periumbilical area without radiation. Onset was a few months ago. Symptoms have been unchanged since. Aggravating factors: none.  Alleviating factors: acetaminophen and and sometimes pain goes away on its own. Associated symptoms: none. The patient denies chills, diarrhea, hematochezia, melena, nausea, and vomiting.     Review of Systems  Constitutional:   Negative for fever, malaise/fatigue and weight loss.  HENT: Negative.  Negative for nosebleeds.   Eyes: Negative.  Negative for blurred vision, double vision and photophobia.  Respiratory: Negative.  Negative for cough and shortness of breath.   Cardiovascular: Negative.  Negative for chest pain, palpitations and leg swelling.  Gastrointestinal:  Positive for abdominal pain. Negative for blood in stool, constipation, diarrhea, heartburn, melena, nausea and vomiting.  Musculoskeletal:  Positive for joint pain and neck pain. Negative for myalgias.  Neurological: Negative.  Negative for dizziness, focal weakness, seizures and headaches.  Psychiatric/Behavioral: Negative.  Negative for suicidal ideas.     Past Medical History:  Diagnosis Date   Allergy    RHINITIS   GERD (gastroesophageal reflux disease)    Neck pain    Palpitations     Past Surgical History:  Procedure Laterality Date   BREAST REDUCTION SURGERY Bilateral 11/21/2020   Procedure: MAMMARY REDUCTION  (BREAST);  Surgeon: [name], MD;  Location: [facility];  Service: Plastics;  Laterality: Bilateral;  2 hours   NO PAST SURGERIES     VAGINAL DELIVERY     X 4    Family History  Problem Relation Age of Onset   Diabetes Mother    Hypertension Mother    Other Father        unsure of medical history   Thyroid disease Sister        goiter  Diabetes Sister    Other Brother        murdered   Asthma Maternal Uncle     Social History Reviewed with no changes to be made today.   Outpatient Medications Prior to Visit  Medication Sig Dispense Refill   cetirizine (ZYRTEC) 10 MG tablet Take 1 tablet (10 mg total) by mouth at bedtime. 30 tablet 1   fluticasone (FLONASE) 50 MCG/ACT nasal spray Place 2 sprays into both nostrils daily. 16 g 6   benzonatate (TESSALON) 100 MG capsule Take 1 capsule (100 mg total) by mouth every 8 (eight) hours. (Patient not taking: Reported on 06/18/2022) 21 capsule 0    promethazine-dextromethorphan (PROMETHAZINE-DM) 6.25-15 MG/5ML syrup Take 5 mLs by mouth at bedtime as needed for cough. (Patient not taking: Reported on 06/18/2022) 118 mL 0   methocarbamol (ROBAXIN) 500 MG tablet 1 to 2 tablets every 8 hours as needed for muscle spasm (Patient not taking: Reported on 06/18/2022) 90 tablet 1   No facility-administered medications prior to visit.    Allergies  Allergen Reactions   Molds & Smuts Anaphylaxis    Patient "unknown reaction but test stated I am allergic" Patient "unknown reaction but test stated I am allergic"    Amoxicillin Nausea And Vomiting   Banana Hives   Fish Allergy Other (See Comments)   Fish Oil Rash   Minocycline Rash and Hives    Rash per patient report. Rash per patient report.   Multivitamins Other (See Comments)    sd her stomach/intestinal area swelled up after 2 hrs. sd her stomach/intestinal area swelled up after 2 hrs.   Strawberry Extract Rash and Other (See Comments)   Tizanidine Other (See Comments)    Causes eyes to turn red. Causes eyes to turn red.   Tramadol Other (See Comments)    "Heart works fast" Eye become red "Heart works fast" Eye become red   Vitamin C Rash   Banana Extract Allergy Skin Test Other (See Comments)   Other Itching    "nuts with itching in ears, nose and throat" per patient   Fish Oil Rash       Objective:    BP 117/77   Pulse 64   Temp 97.9 F (36.6 C) (Temporal)   Ht 5\' 4"  (1.626 m)   Wt 199 lb (90.3 kg)   LMP  (LMP Unknown)   SpO2 98%   BMI 34.16 kg/m  Wt Readings from Last 3 Encounters:  06/18/22 199 lb (90.3 kg)  05/26/22 198 lb (89.8 kg)  04/18/22 198 lb (89.8 kg)    Physical Exam Vitals and nursing note reviewed.  Constitutional:      Appearance: She is well-developed.  HENT:     Head: Normocephalic and atraumatic.  Cardiovascular:     Rate and Rhythm: Normal rate and regular rhythm.     Heart sounds: Normal heart sounds. No murmur heard.    No friction  rub. No gallop.  Pulmonary:     Effort: Pulmonary effort is normal. No tachypnea or respiratory distress.     Breath sounds: Normal breath sounds. No decreased breath sounds, wheezing, rhonchi or rales.  Chest:     Chest wall: No tenderness.  Breasts:    Right: Mass present.    Abdominal:     General: Bowel sounds are normal.     Palpations: Abdomen is soft.  Musculoskeletal:        General: Normal range of motion.     Cervical back: Normal  range of motion.  Skin:    General: Skin is warm and dry.  Neurological:     Mental Status: She is alert and oriented to person, place, and time.     Coordination: Coordination normal.  Psychiatric:        Behavior: Behavior normal. Behavior is cooperative.        Thought Content: Thought content normal.        Judgment: Judgment normal.          Patient has been counseled extensively about nutrition and exercise as well as the importance of adherence with medications and regular follow-up. The patient was given clear instructions to go to ER or return to medical center if symptoms don't improve, worsen or new problems develop. The patient verbalized understanding.   Follow-up: Return in about 3 months (around 09/18/2022) for Dr. Wynetta Emery.   Gildardo Pounds, FNP-BC [facility], [place]   06/19/2022, 8:57 PM

## 2022-06-19 ENCOUNTER — Encounter: Payer: Self-pay | Admitting: Nurse Practitioner

## 2022-06-21 ENCOUNTER — Inpatient Hospital Stay: Admission: RE | Admit: 2022-06-21 | Payer: Medicaid Other | Source: Ambulatory Visit

## 2022-06-24 ENCOUNTER — Telehealth: Payer: Self-pay | Admitting: Internal Medicine

## 2022-06-24 NOTE — Telephone Encounter (Signed)
Patient wants to know what are some weight loss medications that Dr. Wynetta Emery can recommend/prescribe to her following her visit to the [facility] surgery center.

## 2022-06-26 NOTE — Telephone Encounter (Signed)
Pt waiting on appt.   Dr Wynetta Emery said ok to schedule.  [phone]

## 2022-07-01 ENCOUNTER — Ambulatory Visit
Admission: RE | Admit: 2022-07-01 | Discharge: 2022-07-01 | Disposition: A | Discharge status: Home / Self Care | Payer: Medicaid Other | Source: Ambulatory Visit | Attending: Nurse Practitioner | Admitting: Nurse Practitioner

## 2022-07-01 DIAGNOSIS — G8929 Other chronic pain: Secondary | ICD-10-CM

## 2022-07-19 ENCOUNTER — Other Ambulatory Visit: Payer: Self-pay

## 2022-09-20 ENCOUNTER — Encounter: Payer: Self-pay | Admitting: Internal Medicine

## 2022-09-20 ENCOUNTER — Ambulatory Visit: Payer: Commercial Managed Care - HMO | Attending: Internal Medicine | Admitting: Internal Medicine

## 2022-09-20 VITALS — BP 109/73 | HR 61 | Temp 98.3°F | Ht 64.0 in | Wt 201.0 lb

## 2022-09-20 DIAGNOSIS — J3089 Other allergic rhinitis: Secondary | ICD-10-CM

## 2022-09-20 DIAGNOSIS — E669 Obesity, unspecified: Secondary | ICD-10-CM

## 2022-09-20 DIAGNOSIS — N6081 Other benign mammary dysplasias of right breast: Secondary | ICD-10-CM

## 2022-09-20 DIAGNOSIS — Z2821 Immunization not carried out because of patient refusal: Secondary | ICD-10-CM

## 2022-09-20 DIAGNOSIS — N6341 Unspecified lump in right breast, subareolar: Secondary | ICD-10-CM

## 2022-09-20 MED ORDER — CLINDAMYCIN HCL 300 MG PO CAPS: 300.0000 mg | ORAL_CAPSULE | Freq: Three times a day (TID) | ORAL | 0 refills | Status: DC

## 2022-09-20 MED ORDER — CETIRIZINE HCL 10 MG PO TABS: 10.0000 mg | ORAL_TABLET | Freq: Every day | ORAL | 1 refills | Status: DC
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
## 2022-09-20 NOTE — Patient Instructions (Addendum)
[facility]   Ph.# [phone] Fax # [phone] address [address]  [place] Following a healthy eating pattern may help you to achieve and maintain a healthy body weight, reduce the risk of chronic disease, and live a long and productive life. It is important to follow a healthy eating pattern at an appropriate calorie level for your body. Your nutritional needs should be met primarily through food by choosing a variety of nutrient-rich foods. What are tips for following this plan? Reading food labels Read labels and choose the following: Reduced or low sodium. Juices with 100% fruit juice. Foods with low saturated fats and high polyunsaturated and monounsaturated fats. Foods with whole grains, such as whole wheat, cracked wheat, brown rice, and wild rice. Whole grains that are fortified with folic acid. This is recommended for women who are pregnant or who want to become pregnant. Read labels and avoid the following: Foods with a lot of added sugars. These include foods that contain brown sugar, corn sweetener, corn syrup, dextrose, fructose, glucose, high-fructose corn syrup, honey, invert sugar, lactose, malt syrup, maltose, molasses, raw sugar, sucrose, trehalose, or turbinado sugar. Do not eat more than the following amounts of added sugar per day: 6 teaspoons (25 g) for women. 9 teaspoons (38 g) for men. Foods that contain processed or refined starches and grains. Refined grain products, such as white flour, degermed cornmeal, white bread, and white rice. Shopping Choose nutrient-rich snacks, such as vegetables, whole fruits, and nuts. Avoid high-calorie and high-sugar snacks, such as potato chips, fruit snacks, and candy. Use oil-based dressings and spreads on foods instead of solid fats such as butter, stick margarine, or cream cheese. Limit pre-made sauces, mixes, and "instant" products such as flavored rice, instant noodles, and  ready-made pasta. Try more plant-protein sources, such as tofu, tempeh, black beans, edamame, lentils, nuts, and seeds. Explore eating plans such as the Mediterranean diet or vegetarian diet. Cooking Use oil to saut or stir-fry foods instead of solid fats such as butter, stick margarine, or lard. Try baking, boiling, grilling, or broiling instead of frying. Remove the fatty part of meats before cooking. Steam vegetables in water or broth. Meal planning  At meals, imagine dividing your plate into fourths: One-half of your plate is fruits and vegetables. One-fourth of your plate is whole grains. One-fourth of your plate is protein, especially lean meats, poultry, eggs, tofu, beans, or nuts. Include low-fat dairy as part of your daily diet. Lifestyle Choose healthy options in all settings, including home, work, school, restaurants, or stores. Prepare your food safely: Wash your hands after handling raw meats. Keep food preparation surfaces clean by regularly washing with hot, soapy water. Keep raw meats separate from ready-to-eat foods, such as fruits and vegetables. Cook seafood, meat, poultry, and eggs to the recommended internal temperature. Store foods at safe temperatures. In general: Keep cold foods at 54F (4.4C) or below. Keep hot foods at 154F (60C) or above. Keep your freezer at [facility] (-17.8C) or below. Foods are no longer safe to eat when they have been between the temperatures of 40-154F (4.4-60C) for more than 2 hours. What foods should I eat? Fruits Aim to eat 2 cup-equivalents of fresh, canned (in natural juice), or frozen fruits each day. Examples of 1 cup-equivalent of fruit include 1 small apple, 8 large strawberries, 1 cup canned fruit,  cup dried fruit, or 1 cup 100% juice. Vegetables Aim to eat 2-3 cup-equivalents of fresh and  frozen vegetables each day, including different varieties and colors. Examples of 1 cup-equivalent of vegetables include 2 medium  carrots, 2 cups raw, leafy greens, 1 cup chopped vegetable (raw or cooked), or 1 medium baked potato. Grains Aim to eat 6 ounce-equivalents of whole grains each day. Examples of 1 ounce-equivalent of grains include 1 slice of bread, 1 cup ready-to-eat cereal, 3 cups popcorn, or  cup cooked rice, pasta, or cereal. Meats and other proteins Aim to eat 5-6 ounce-equivalents of protein each day. Examples of 1 ounce-equivalent of protein include 1 egg, 1/2 cup nuts or seeds, or 1 tablespoon (16 g) peanut butter. A cut of meat or fish that is the size of a deck of cards is about 3-4 ounce-equivalents. Of the protein you eat each week, try to have at least 8 ounces come from seafood. This includes salmon, trout, herring, and anchovies. Dairy Aim to eat 3 cup-equivalents of fat-free or low-fat dairy each day. Examples of 1 cup-equivalent of dairy include 1 cup (240 mL) milk, 8 ounces (250 g) yogurt, 1 ounces (44 g) natural cheese, or 1 cup (240 mL) fortified soy milk. Fats and oils Aim for about 5 teaspoons (21 g) per day. Choose monounsaturated fats, such as canola and olive oils, avocados, peanut butter, and most nuts, or polyunsaturated fats, such as sunflower, corn, and soybean oils, walnuts, pine nuts, sesame seeds, sunflower seeds, and flaxseed. Beverages Aim for six 8-oz glasses of water per day. Limit coffee to three to five 8-oz cups per day. Limit caffeinated beverages that have added calories, such as soda and energy drinks. Limit alcohol intake to no more than 1 drink a day for nonpregnant women and 2 drinks a day for men. One drink equals 12 oz of beer (355 mL), 5 oz of wine (148 mL), or 1 oz of hard liquor (44 mL). Seasoning and other foods Avoid adding excess amounts of salt to your foods. Try flavoring foods with herbs and spices instead of salt. Avoid adding sugar to foods. Try using oil-based dressings, sauces, and spreads instead of solid fats. This information is based on general [place]  nutrition guidelines. For more information, visit [employer].es. Exact amounts may vary based on your nutrition needs. Summary A healthy eating plan may help you to maintain a healthy weight, reduce the risk of chronic diseases, and stay active throughout your life. Plan your meals. Make sure you eat the right portions of a variety of nutrient-rich foods. Try baking, boiling, grilling, or broiling instead of frying. Choose healthy options in all settings, including home, work, school, restaurants, or stores. This information is not intended to replace advice given to you by your health care provider. Make sure you discuss any questions you have with your health care provider. Document Revised: 01/30/2022 Document Reviewed: 04/17/2021 Elsevier Patient Education  [place].

## 2022-09-27 ENCOUNTER — Ambulatory Visit: Payer: Self-pay | Admitting: Surgery

## 2022-10-04 ENCOUNTER — Other Ambulatory Visit: Payer: Self-pay | Admitting: Internal Medicine

## 2022-10-04 DIAGNOSIS — N6341 Unspecified lump in right breast, subareolar: Secondary | ICD-10-CM

## 2022-10-17 ENCOUNTER — Emergency Department (HOSPITAL_BASED_OUTPATIENT_CLINIC_OR_DEPARTMENT_OTHER)
Admission: EM | Admit: 2022-10-17 | Discharge: 2022-10-17 | Disposition: A | Discharge status: Home / Self Care | Payer: Commercial Managed Care - HMO | Attending: Emergency Medicine | Admitting: Emergency Medicine

## 2022-10-17 ENCOUNTER — Telehealth: Payer: Self-pay | Admitting: Internal Medicine

## 2022-10-17 ENCOUNTER — Other Ambulatory Visit: Payer: Self-pay

## 2022-10-17 ENCOUNTER — Encounter (HOSPITAL_BASED_OUTPATIENT_CLINIC_OR_DEPARTMENT_OTHER): Payer: Self-pay

## 2022-10-17 DIAGNOSIS — N926 Irregular menstruation, unspecified: Secondary | ICD-10-CM

## 2022-10-17 DIAGNOSIS — R1031 Right lower quadrant pain: Secondary | ICD-10-CM | POA: Diagnosis not present

## 2022-10-17 DIAGNOSIS — N912 Amenorrhea, unspecified: Secondary | ICD-10-CM | POA: Insufficient documentation

## 2022-10-17 DIAGNOSIS — R1032 Left lower quadrant pain: Secondary | ICD-10-CM | POA: Diagnosis not present

## 2022-10-17 DIAGNOSIS — R102 Pelvic and perineal pain: Secondary | ICD-10-CM

## 2022-10-17 LAB — COMPREHENSIVE METABOLIC PANEL
ALT: 16 U/L (ref 0–44)
AST: 16 U/L (ref 15–41)
Albumin: 4.4 g/dL (ref 3.5–5.0)
Alkaline Phosphatase: 53 U/L (ref 38–126)
Anion gap: 8 (ref 5–15)
BUN: 12 mg/dL (ref 6–20)
CO2: 26 mmol/L (ref 22–32)
Calcium: 9.3 mg/dL (ref 8.9–10.3)
Chloride: 104 mmol/L (ref 98–111)
Creatinine, Ser: 0.49 mg/dL (ref 0.44–1.00)
GFR, Estimated: >60 mL/min (ref >60)
Glucose, Bld: 95 mg/dL (ref 70–99)
Potassium: 3.7 mmol/L (ref 3.5–5.1)
Sodium: 138 mmol/L (ref 135–145)
Total Bilirubin: 0.2 mg/dL — ABNORMAL LOW (ref 0.3–1.2)
Total Protein: 7.7 g/dL (ref 6.5–8.1)

## 2022-10-17 LAB — CBC
HCT: 38.2 % (ref 36.0–46.0)
Hemoglobin: 12.5 g/dL (ref 12.0–15.0)
MCH: 28.1 pg (ref 26.0–34.0)
MCHC: 32.7 g/dL (ref 30.0–36.0)
MCV: 85.8 fL (ref 80.0–100.0)
Platelets: 240 10*3/uL (ref 150–400)
RBC: 4.45 MIL/uL (ref 3.87–5.11)
RDW: 13 % (ref 11.5–15.5)
WBC: 8 10*3/uL (ref 4.0–10.5)
nRBC: 0 % (ref 0.0–0.2)

## 2022-10-17 LAB — LIPASE, BLOOD: Lipase: 10 U/L — ABNORMAL LOW (ref 11–51)

## 2022-10-17 LAB — WET PREP, GENITAL
Clue Cells Wet Prep HPF POC: NONE SEEN
Sperm: NONE SEEN
Trich, Wet Prep: NONE SEEN
WBC, Wet Prep HPF POC: >=10 — AB (ref <10)
Yeast Wet Prep HPF POC: NONE SEEN

## 2022-10-17 LAB — URINALYSIS, ROUTINE W REFLEX MICROSCOPIC
Bilirubin Urine: NEGATIVE
Glucose, UA: NEGATIVE mg/dL
Hgb urine dipstick: NEGATIVE
Ketones, ur: NEGATIVE mg/dL
Leukocytes,Ua: NEGATIVE
Nitrite: NEGATIVE
Protein, ur: NEGATIVE mg/dL
Specific Gravity, Urine: 1.005 (ref 1.005–1.030)
pH: 7 (ref 5.0–8.0)

## 2022-10-17 LAB — PREGNANCY, URINE: Preg Test, Ur: NEGATIVE

## 2022-10-17 MED ORDER — CEFTRIAXONE SODIUM 500 MG IJ SOLR
500.0000 mg | INTRAMUSCULAR | Status: AC
  Administered 2022-10-17: 500 mg via INTRAMUSCULAR
  Filled 2022-10-17: qty 500

## 2022-10-17 MED ORDER — AZITHROMYCIN 250 MG PO TABS: 250.0000 mg | ORAL_TABLET | Freq: Every day | ORAL | 0 refills | Status: AC

## 2022-10-17 MED ORDER — METRONIDAZOLE 500 MG PO TABS: 500.0000 mg | ORAL_TABLET | Freq: Two times a day (BID) | ORAL | 0 refills | Status: DC

## 2022-10-17 NOTE — ED Provider Notes (Signed)
[facility] Provider Note   CSN: [id number] Arrival date & time: 10/17/22  [id number]     History  Chief Complaint  Patient presents with   Abdominal Pain    Denise Arias is a 42 y.o. female.  42 year old female who presents to the emergency department with pelvic pain. Patient states that several days ago she started having pelvic pain across her entire lower abdomen.  No vaginal discharge or bleeding.  Says that she is late for her menstrual cycle. With LMP being on 1/10 to 09/17/2021. Is currently sexually active.  Is concerned about possible sexually transmitted infections.  No history of abdominal surgeries.  No fevers or nausea or vomiting recently.       Home Medications Prior to Admission medications   Medication Sig Start Date End Date Taking? Authorizing Provider  azithromycin (ZITHROMAX) 250 MG tablet Take 1 tablet (250 mg total) by mouth daily for 7 days. Take first 2 tablets together, then 1 every day until finished. 10/17/22 10/24/22 Yes Fransico Meadow, MD  metroNIDAZOLE (FLAGYL) 500 MG tablet Take 1 tablet (500 mg total) by mouth 2 (two) times daily. 10/17/22  Yes Fransico Meadow, MD  cetirizine (ZYRTEC) 10 MG tablet Take 1 tablet (10 mg total) by mouth at bedtime. 09/20/22   Ladell Pier, MD  fluticasone (FLONASE) 50 MCG/ACT nasal spray Place 2 sprays into both nostrils daily. 02/07/22   Argentina Donovan, PA-C  meloxicam (MOBIC) 7.5 MG tablet Take 1 tablet (7.5 mg total) by mouth daily. For neck and shoulder pain Patient not taking: Reported on 09/20/2022 06/18/22   Gildardo Pounds, NP  methocarbamol (ROBAXIN) 500 MG tablet 1 to 2 tablets every 8 hours as needed for muscle spasm Patient not taking: Reported on 09/20/2022 06/18/22   Gildardo Pounds, NP      Allergies    Molds & smuts, Amoxicillin, Banana, Fish allergy, Fish oil, Minocycline, Multivitamins, Strawberry extract, Tizanidine, Tramadol, Vitamin c, Banana  extract allergy skin test, Other, and Fish oil    Review of Systems   Review of Systems  Physical Exam Updated Vital Signs BP 121/81   Pulse (!) 57   Temp 98.1 F (36.7 C) (Oral)   Resp 16   Ht 5' 4"$  (1.626 m)   Wt 87.1 kg   SpO2 100%   BMI 32.96 kg/m  Physical Exam Vitals and nursing note reviewed.  Constitutional:      General: She is not in acute distress.    Appearance: She is well-developed.  HENT:     Head: Normocephalic and atraumatic.     Right Ear: External ear normal.     Left Ear: External ear normal.     Nose: Nose normal.  Eyes:     Extraocular Movements: Extraocular movements intact.     Conjunctiva/sclera: Conjunctivae normal.     Pupils: Pupils are equal, round, and reactive to light.  Cardiovascular:     Rate and Rhythm: Normal rate and regular rhythm.     Heart sounds: No murmur heard. Pulmonary:     Effort: Pulmonary effort is normal. No respiratory distress.     Breath sounds: Normal breath sounds.  Abdominal:     General: Abdomen is flat. There is no distension.     Palpations: Abdomen is soft. There is no mass.     Tenderness: There is abdominal tenderness (Right lower quadrant, left lower quadrant, suprapubic area). There is no guarding.  Genitourinary:  Comments: Chaperoned by patient tech.  External genitalia unremarkable. Nor rashes or lesions noted.  Speculum exam with thick whitish vaginal discharge.  Vaginal wall mucosa is unremarkable.  Cervix visualized and is unremarkable (closed in appearance without any protruding material).  Bimanual exam without cervical motion tenderness, adnexal tenderness or any masses appreciated.  Musculoskeletal:     Cervical back: Normal range of motion and neck supple.     Right lower leg: No edema.     Left lower leg: No edema.  Skin:    General: Skin is warm and dry.  Neurological:     Mental Status: She is alert and oriented to person, place, and time. Mental status is at baseline.   Psychiatric:        Mood and Affect: Mood normal.     ED Results / Procedures / Treatments   Labs (all labs ordered are listed, but only abnormal results are displayed) Labs Reviewed  WET PREP, GENITAL - Abnormal; Notable for the following components:      Result Value   WBC, Wet Prep HPF POC >=10 (*)    All other components within normal limits  LIPASE, BLOOD - Abnormal; Notable for the following components:   Lipase 10 (*)    All other components within normal limits  COMPREHENSIVE METABOLIC PANEL - Abnormal; Notable for the following components:   Total Bilirubin 0.2 (*)    All other components within normal limits  URINALYSIS, ROUTINE W REFLEX MICROSCOPIC - Abnormal; Notable for the following components:   Color, Urine COLORLESS (*)    All other components within normal limits  CBC  PREGNANCY, URINE  HIV ANTIBODY (ROUTINE TESTING W REFLEX)  RPR  GC/CHLAMYDIA PROBE AMP ([place]) NOT AT [facility]    EKG None  Radiology No results found.  Procedures Procedures   Medications Ordered in ED Medications  cefTRIAXone (ROCEPHIN) injection 500 mg (500 mg Intramuscular Given 10/17/22 2004)    ED Course/ Medical Decision Making/ A&P                             Medical Decision Making Amount and/or Complexity of Data Reviewed Labs: ordered.  Risk Prescription drug management.   Denise Arias is a 42 y.o. female who presents to the emergency department with pelvic pain and missed period  Initial Ddx:  Ectopic pregnancy, miscarriage, normal IUP, PID, appendicitis, UTI  MDM:  Concerned about the above diagnoses given the patient's history.  Will obtain pregnancy test to evaluate for the first 2 diagnoses.  Also very concerned for PID with the patient's worry about STIs and bilateral lower pelvic pain.  Continue to be highly unlikely and the fact that her tenderness to palpation of bilateral.  Plan:  Labs Pregnancy test Urinalysis Pelvic exam and swabs  ED  Summary/Re-evaluation:  Patient underwent the above evaluation.  No significant tenderness to palpation on pelvic exam will be concerning for tubo-ovarian abscess.  Urgency test was negative.  Unclear exactly what is causing her symptoms but will treat empirically for PID after discussing risks and benefits of empiric treatment with the patient.  STI labs were sent off and are pending and patient will check your MyChart for the results.  This patient presents to the ED for concern of complaints listed in HPI, this involves an extensive number of treatment options, and is a complaint that carries with it a high risk of complications and morbidity. Disposition including potential need for  admission considered.   Dispo: DC Home. Return precautions discussed including, but not limited to, those listed in the AVS. Allowed pt time to ask questions which were answered fully prior to dc.  Records reviewed Outpatient Clinic Notes and ED Visit Notes The following labs were independently interpreted: Urinalysis and show no acute abnormality I personally reviewed and interpreted cardiac monitoring: normal sinus rhythm  I have reviewed the patients home medications and made adjustments as needed   Final Clinical Impression(s) / ED Diagnoses Final diagnoses:  Pelvic pain  Missed period    Rx / DC Orders ED Discharge Orders          Ordered    metroNIDAZOLE (FLAGYL) 500 MG tablet  2 times daily        10/17/22 2048    azithromycin (ZITHROMAX) 250 MG tablet  Daily        10/17/22 2048              Fransico Meadow, MD 10/19/22 1523

## 2022-10-17 NOTE — ED Notes (Signed)
MD at bedside for pelvic, EDT assisted with procedure.

## 2022-10-17 NOTE — ED Triage Notes (Signed)
Patient here POV from Home.  Endorses Lower ABD Pain that began 4 Days ago. States it has not been present since Yesterday AM. Subsided completely and seeks evaluation to find out what caused the pain.  No N/V/D. No Fevers.  NAD Noted during Triage. A&Ox4. GCS 15. Ambulatory.

## 2022-10-17 NOTE — Discharge Instructions (Addendum)
You were seen for your pelvic pain and missed period in the emergency department.   At home, please take the antibiotics we have prescribed you in case you have a condition called pelvic inflammatory disease.    Check your MyChart online for the results of any tests that had not resulted by the time you left the emergency department.   Follow-up with your primary doctor in 2-3 days regarding your visit and to discuss your missed period.    Return immediately to the emergency department if you experience any of the following: Fevers, severe pain, or any other concerning symptoms.    Thank you for visiting our Emergency Department. It was a pleasure taking care of you today.

## 2022-10-18 LAB — RPR: RPR Ser Ql: NONREACTIVE

## 2022-10-18 LAB — GC/CHLAMYDIA PROBE AMP (~~LOC~~) NOT AT ARMC
Chlamydia: NEGATIVE
Comment: NEGATIVE
Comment: NORMAL
Neisseria Gonorrhea: NEGATIVE

## 2022-10-18 LAB — HIV ANTIBODY (ROUTINE TESTING W REFLEX): HIV Screen 4th Generation wRfx: NONREACTIVE

## 2022-12-25 ENCOUNTER — Emergency Department (HOSPITAL_BASED_OUTPATIENT_CLINIC_OR_DEPARTMENT_OTHER)
Admission: EM | Admit: 2022-12-25 | Discharge: 2022-12-25 | Disposition: A | Discharge status: Home / Self Care | Payer: Medicaid Other | Attending: Emergency Medicine | Admitting: Emergency Medicine

## 2022-12-25 ENCOUNTER — Encounter (HOSPITAL_BASED_OUTPATIENT_CLINIC_OR_DEPARTMENT_OTHER): Payer: Self-pay

## 2022-12-25 ENCOUNTER — Other Ambulatory Visit: Payer: Self-pay

## 2022-12-25 ENCOUNTER — Ambulatory Visit: Payer: Self-pay | Admitting: *Deleted

## 2022-12-25 DIAGNOSIS — K529 Noninfective gastroenteritis and colitis, unspecified: Secondary | ICD-10-CM | POA: Insufficient documentation

## 2022-12-25 DIAGNOSIS — R1013 Epigastric pain: Secondary | ICD-10-CM | POA: Diagnosis present

## 2022-12-25 LAB — CBC
HCT: 37.3 % (ref 36.0–46.0)
Hemoglobin: 12.1 g/dL (ref 12.0–15.0)
MCH: 28.2 pg (ref 26.0–34.0)
MCHC: 32.4 g/dL (ref 30.0–36.0)
MCV: 86.9 fL (ref 80.0–100.0)
Platelets: 210 10*3/uL (ref 150–400)
RBC: 4.29 MIL/uL (ref 3.87–5.11)
RDW: 13.7 % (ref 11.5–15.5)
WBC: 5.2 10*3/uL (ref 4.0–10.5)
nRBC: 0 % (ref 0.0–0.2)

## 2022-12-25 LAB — URINALYSIS, ROUTINE W REFLEX MICROSCOPIC
Bilirubin Urine: NEGATIVE
Glucose, UA: NEGATIVE mg/dL
Hgb urine dipstick: NEGATIVE
Ketones, ur: NEGATIVE mg/dL
Leukocytes,Ua: NEGATIVE
Nitrite: NEGATIVE
Protein, ur: NEGATIVE mg/dL
Specific Gravity, Urine: 1.009 (ref 1.005–1.030)
pH: 6 (ref 5.0–8.0)

## 2022-12-25 LAB — COMPREHENSIVE METABOLIC PANEL
ALT: 38 U/L (ref 0–44)
AST: 34 U/L (ref 15–41)
Albumin: 4.1 g/dL (ref 3.5–5.0)
Alkaline Phosphatase: 49 U/L (ref 38–126)
Anion gap: 9 (ref 5–15)
BUN: 10 mg/dL (ref 6–20)
CO2: 23 mmol/L (ref 22–32)
Calcium: 8.7 mg/dL — ABNORMAL LOW (ref 8.9–10.3)
Chloride: 103 mmol/L (ref 98–111)
Creatinine, Ser: 0.47 mg/dL (ref 0.44–1.00)
GFR, Estimated: >60 mL/min (ref >60)
Glucose, Bld: 100 mg/dL — ABNORMAL HIGH (ref 70–99)
Potassium: 3.6 mmol/L (ref 3.5–5.1)
Sodium: 135 mmol/L (ref 135–145)
Total Bilirubin: 0.4 mg/dL (ref 0.3–1.2)
Total Protein: 7 g/dL (ref 6.5–8.1)

## 2022-12-25 LAB — HCG, SERUM, QUALITATIVE: Preg, Serum: NEGATIVE

## 2022-12-25 LAB — LIPASE, BLOOD: Lipase: <10 U/L — ABNORMAL LOW (ref 11–51)

## 2022-12-25 MED ORDER — SODIUM CHLORIDE 0.9 % IV BOLUS
1000.0000 mL | Freq: Once | INTRAVENOUS | Status: AC
  Administered 2022-12-25: 1000 mL via INTRAVENOUS

## 2022-12-25 MED ORDER — ONDANSETRON 4 MG PO TBDP: 4.0000 mg | ORAL_TABLET | Freq: Three times a day (TID) | ORAL | 0 refills | Status: DC | PRN

## 2022-12-25 MED ORDER — ONDANSETRON HCL 4 MG/2ML IJ SOLN
4.0000 mg | Freq: Once | INTRAMUSCULAR | Status: AC
  Administered 2022-12-25: 4 mg via INTRAVENOUS
  Filled 2022-12-25: qty 2

## 2022-12-25 NOTE — Telephone Encounter (Signed)
Reason for Disposition  [1] MILD pain (e.g., does not interfere with normal activities) AND [2] pain comes and goes (cramps) AND [3] present > 48 hours  (Exception: This same abdominal pain is a chronic symptom recurrent or ongoing AND present > 4 weeks.)  Answer Assessment - Initial Assessment Questions 1. LOCATION: "Where does it hurt?"      Having abd pain, diarrhea and vomiting  Pain in lower part of abd. In middle.   No burning with urination. 2. RADIATION: "Does the pain shoot anywhere else?" (e.g., chest, back)     No 3. 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 4. SUDDEN: "Gradual or sudden onset?"     Not asked 5. PATTERN "Does the pain come and go, or is it constant?"    - If it comes and goes: "How long does it last?" "Do you have pain now?"     (Note: Comes and goes means the pain is intermittent. It goes away completely between bouts.)    - If constant: "Is it getting better, staying the same, or getting worse?"      (Note: Constant means the pain never goes away completely; most serious pain is constant and gets worse.)      Pepto Bismol not helping.    I just drank apple juice.   I don't have any appetite.   I feel like I have fever. 6. SEVERITY: "How bad is the pain?"  (e.g., Scale 1-10; mild, moderate, or severe)    - MILD (1-3): Doesn't interfere with normal activities, abdomen soft and not tender to touch.     - MODERATE (4-7): Interferes with normal activities or awakens from sleep, abdomen tender to touch.     - SEVERE (8-10): Excruciating pain, doubled over, unable to do any normal activities.       Not asked 7. RECURRENT SYMPTOM: "Have you ever had this type of stomach pain before?" If Yes, ask: "When was the last time?" and "What happened that time?"      No 8. CAUSE: "What do you think is causing the stomach pain?"     Not asked 9. RELIEVING/AGGRAVATING FACTORS: "What makes it better or worse?" (e.g., antacids,  bending or twisting motion, bowel movement)     Nothing helping 10. OTHER SYMPTOMS: "Do you have any other symptoms?" (e.g., back pain, diarrhea, fever, urination pain, vomiting)       Diarrhea several times today.   Nausea.   Vomited this morning once.    11. PREGNANCY: "Is there any chance you are pregnant?" "When was your last menstrual period?"       No  Protocols used: Abdominal Pain - [facility]

## 2022-12-25 NOTE — ED Notes (Signed)
Pt is aware urine is needed however unable to give a sample at this time.

## 2022-12-25 NOTE — ED Notes (Signed)
Reviewed AVS/discharge instruction with patient. Time allotted for and all questions answered. Patient is agreeable for d/c and escorted to ed exit by staff.  

## 2022-12-25 NOTE — ED Triage Notes (Signed)
Patient here POV from Home.  Endorses N/V/D that began yesterday and has continued through today. Some Cramping and Sharp ABD Pain as well. Subjective fever.  NAD Noted during Triage. A&Ox4. Gcs 15. Ambulatory.

## 2022-12-25 NOTE — Telephone Encounter (Signed)
  Chief Complaint: abd pain in lower middle abd, diarrhea and nausea   Vomited once this morning Symptoms: above Frequency: Started yesterday.   Pepto-Bismol not helping.  Feels like she has fever.   Having chills Pertinent Negatives: Patient denies vomiting any more except once this morning Disposition: ED /[] Urgent Care (no appt availability in office) / Appointment(In office/virtual)/  [facility]/ Home Care/ Refused Recommended Disposition /[] [facility]/  Follow-up with PCP Additional Notes: Being around anyone sick or household members being sick.   There is an appt available tomorrow with Dr. Alvis Lemmings at 1:20.   She is going to check and call back if she can do that appt.   I let her know to call back as soon as possible that the appt. May not be available long.   She verbalized understanding.

## 2022-12-25 NOTE — ED Provider Notes (Signed)
[place] EMERGENCY DEPARTMENT AT [facility] Provider Note   CSN: [id number] Arrival date & time: 12/25/22  1503     History  Chief Complaint  Patient presents with   Diarrhea    Denise Arias is a 42 y.o. female.  With a history of allergies,.,  Anxiety, depression who presents to the ED for evaluation of epigastric pain.  This began yesterday and has progressively gotten worse.  She is having 3 total episodes of emesis as well.  This is nonbloody and nonbilious.  She has had numerous episodes of diarrhea.  Also nonbloody.  No melena or hematochezia.  She has taken Tums with no improvement in her symptoms.  Pain is described as a cramping sensation.  It does not radiate.  She did eat some beans and liver the day prior to symptom onset which she believes may be contributing.  She denies fevers, chest pain, shortness of breath, lower abdominal pain, urinary symptoms recent antibiotic use or travel.   Diarrhea Associated symptoms: abdominal pain and vomiting        Home Medications Prior to Admission medications   Medication Sig Start Date End Date Taking? Authorizing Provider  ondansetron (ZOFRAN-ODT) 4 MG disintegrating tablet Take 1 tablet (4 mg total) by mouth every 8 (eight) hours as needed for nausea or vomiting. 12/25/22  Yes Saira Kramme, Edsel Petrin, PA-C  cetirizine (ZYRTEC) 10 MG tablet Take 1 tablet (10 mg total) by mouth at bedtime. 09/20/22   Marcine Matar, MD  fluticasone (FLONASE) 50 MCG/ACT nasal spray Place 2 sprays into both nostrils daily. 02/07/22   Anders Simmonds, PA-C  meloxicam (MOBIC) 7.5 MG tablet Take 1 tablet (7.5 mg total) by mouth daily. For neck and shoulder pain Patient not taking: Reported on 09/20/2022 06/18/22   Claiborne Rigg, NP  methocarbamol (ROBAXIN) 500 MG tablet 1 to 2 tablets every 8 hours as needed for muscle spasm Patient not taking: Reported on 09/20/2022 06/18/22   Claiborne Rigg, NP  metroNIDAZOLE (FLAGYL) 500 MG tablet Take 1  tablet (500 mg total) by mouth 2 (two) times daily. 10/17/22   Rondel Baton, MD      Allergies    Molds & smuts, Amoxicillin, Banana, Fish allergy, Fish oil, Minocycline, Multivitamins, Strawberry extract, Tizanidine, Tramadol, Vitamin c, Banana extract allergy skin test, Other, and Fish oil    Review of Systems   Review of Systems  Gastrointestinal:  Positive for abdominal pain, diarrhea, nausea and vomiting.  All other systems reviewed and are negative.   Physical Exam Updated Vital Signs BP 121/86 (BP Location: Left Arm)   Pulse 80   Temp 98.6 F (37 C) (Oral)   Resp 18   Ht  (1.626 m)   Wt 93.4 kg   SpO2 99%   BMI 35.36 kg/m  Physical Exam Vitals and nursing note reviewed.  Constitutional:      General: She is not in acute distress.    Appearance: She is well-developed.  HENT:     Head: Normocephalic and atraumatic.  Eyes:     Conjunctiva/sclera: Conjunctivae normal.  Cardiovascular:     Rate and Rhythm: Normal rate and regular rhythm.     Heart sounds: No murmur heard. Pulmonary:     Effort: Pulmonary effort is normal. No respiratory distress.     Breath sounds: Normal breath sounds. No wheezing, rhonchi or rales.  Abdominal:     Palpations: Abdomen is soft.     Tenderness: There is no abdominal  tenderness. There is no guarding.     Comments: Negative Murphy sign.  No McBurney's point tenderness  Musculoskeletal:        General: No swelling.     Cervical back: Neck supple.     Right lower leg: No edema.     Left lower leg: No edema.  Skin:    General: Skin is warm and dry.     Capillary Refill: Capillary refill takes less than 2 seconds.  Neurological:     General: No focal deficit present.     Mental Status: She is alert and oriented to person, place, and time.  Psychiatric:        Mood and Affect: Mood normal.        Behavior: Behavior normal.     ED Results / Procedures / Treatments   Labs (all labs ordered are listed, but only abnormal  results are displayed) Labs Reviewed  LIPASE, BLOOD - Abnormal; Notable for the following components:      Result Value   Lipase <10 (*)    All other components within normal limits  COMPREHENSIVE METABOLIC PANEL - Abnormal; Notable for the following components:   Glucose, Bld 100 (*)    Calcium 8.7 (*)    All other components within normal limits  CBC  HCG, SERUM, QUALITATIVE  URINALYSIS, ROUTINE W REFLEX MICROSCOPIC    EKG None  Radiology No results found.  Procedures Procedures    Medications Ordered in ED Medications  sodium chloride 0.9 % bolus 1,000 mL (0 mLs Intravenous Stopped 12/25/22 1821)  ondansetron (ZOFRAN) injection 4 mg (4 mg Intravenous Given 12/25/22 1724)    ED Course/ Medical Decision Making/ A&P                             Medical Decision Making Amount and/or Complexity of Data Reviewed Labs: ordered.  Risk Prescription drug management.  This patient presents to the ED for concern of nausea, vomiting, diarrhea, this involves an extensive number of treatment options, and is a complaint that carries with it a high risk of complications and morbidity. The differential diagnosis of diarrhea includes but is not limited to Viral- norovirus/rotavirus; Bacterial-Campylobacter,Shigella, Salmonella, Escherichia coli, E. coli 0157:H7, Yersinia enterocolitica, Vibrio cholerae, Clostridium difficile. Parasitic- Giardia lamblia, Cryptosporidium,Entamoeba histolytica,Cyclospora, Microsporidium. Toxin- Staphylococcus aureus, Bacillus cereus. Noninfectious causes include GI Bleed, Appendicitis, Mesenteric Ischemia, Diverticulitis, Adrenal Crisis, Thyroid Storm, Toxicologic exposures, Antibiotic or drug-associated, inflammatory bowel disease.   Co morbidities that complicate the patient evaluation  anxiety, depression  My initial workup includes labs, fluids, nausea control  Additional history obtained from: Nursing notes from this visit.  I ordered, reviewed  and interpreted labs which include: CBC, CMP, lipase, hCG, urinalysis.  CMP, CBC, hCG, lipase all within normal limits  Afebrile, hemodynamically stable.  42 year old female presenting to the ED for evaluation of generalized abdominal pain, nausea, vomiting and diarrhea.  Symptoms began shortly after eating beans and liver.  Gastroenteritis is the likely cause of her symptoms.  She reported significant improvement after IV fluids and Zofran.  She passed her p.o. challenge in the ED without difficulty.  I have low suspicion for acute emergent intra-abdominal abnormalities given her reassuring exam and workup.  Patient was sent a prescription for Zofran and encouraged to follow-up with her primary care provider in 1 week for reevaluation of her symptoms.  She was given strict return precautions.  Stable at discharge.  At this time there does  not appear to be any evidence of an acute emergency medical condition and the patient appears stable for discharge with appropriate outpatient follow up. Diagnosis was discussed with patient who verbalizes understanding of care plan and is agreeable to discharge. I have discussed return precautions with patient who verbalizes understanding. Patient encouraged to follow-up with their PCP within 2 week. All questions answered.  Note: Portions of this report may have been transcribed using voice recognition software. Every effort was made to ensure accuracy; however, inadvertent computerized transcription errors may still be present.        Final Clinical Impression(s) / ED Diagnoses Final diagnoses:  Gastroenteritis    Rx / DC Orders ED Discharge Orders          Ordered    ondansetron (ZOFRAN-ODT) 4 MG disintegrating tablet  Every 8 hours PRN        12/25/22 1844              Michelle Piper, PA-C 12/25/22 1845    Rolan Bucco, MD 12/25/22 2217

## 2022-12-25 NOTE — Discharge Instructions (Signed)
You have been seen today for your complaint of nausea, vomiting and diarrhea. Your lab work was reassuring and showed no abnormalities. Your discharge medications include Zofran.  This is a nausea medicine.  Take it as needed in order to eat and drink normal diet. Home care instructions are as follows:  Progress to normal diet over the course of the next 2 days Follow up with: Your primary care provider in 1 week for reevaluation of your symptoms Please seek immediate medical care if you develop any of the following symptoms: You have chest pain or your heart is beating very quickly. You have trouble breathing or you are breathing very quickly. You feel extremely weak or you faint. At this time there does not appear to be the presence of an emergent medical condition, however there is always the potential for conditions to change. Please read and follow the below instructions.  Do not take your medicine if  develop an itchy rash, swelling in your mouth or lips, or difficulty breathing; call 911 and seek immediate emergency medical attention if this occurs.  You may review your lab tests and imaging results in their entirety on your MyChart account.  Please discuss all results of fully with your primary care provider and other specialist at your follow-up visit.  Note: Portions of this text may have been transcribed using voice recognition software. Every effort was made to ensure accuracy; however, inadvertent computerized transcription errors may still be present.

## 2023-01-15 ENCOUNTER — Ambulatory Visit: Payer: Medicaid Other | Attending: Physician Assistant | Admitting: Physician Assistant

## 2023-01-15 ENCOUNTER — Encounter: Payer: Self-pay | Admitting: Physician Assistant

## 2023-01-15 VITALS — BP 126/84 | HR 59 | Ht 65.0 in | Wt 204.2 lb

## 2023-01-15 DIAGNOSIS — R1013 Epigastric pain: Secondary | ICD-10-CM | POA: Diagnosis not present

## 2023-01-15 DIAGNOSIS — H6993 Unspecified Eustachian tube disorder, bilateral: Secondary | ICD-10-CM | POA: Diagnosis not present

## 2023-01-15 DIAGNOSIS — J3089 Other allergic rhinitis: Secondary | ICD-10-CM | POA: Diagnosis not present

## 2023-01-15 DIAGNOSIS — M79672 Pain in left foot: Secondary | ICD-10-CM

## 2023-01-15 DIAGNOSIS — Z09 Encounter for follow-up examination after completed treatment for conditions other than malignant neoplasm: Secondary | ICD-10-CM

## 2023-01-15 MED ORDER — CETIRIZINE HCL 10 MG PO TABS
10.0000 mg | ORAL_TABLET | Freq: Every day | ORAL | 1 refills | Status: DC
Start: 2023-01-15 — End: 2023-05-07

## 2023-01-15 MED ORDER — MELOXICAM 7.5 MG PO TABS
7.5000 mg | ORAL_TABLET | Freq: Every day | ORAL | 1 refills | Status: DC
Start: 2023-01-15 — End: 2023-05-07

## 2023-01-15 MED ORDER — FLUTICASONE PROPIONATE 50 MCG/ACT NA SUSP: 2.0000 | Freq: Every day | NASAL | 6 refills | Status: DC

## 2023-01-15 NOTE — Patient Instructions (Signed)
Foot Pain Many things can cause foot pain. Common causes include injuries to the foot. The injuries include sprains or broken bones, or injuries that affect the nerves in the feet. Other causes of foot pain include arthritis, blisters, and bunions. To know what causes your foot pain, your health care provider will take a detailed history of your symptoms. They will also do a physical exam as well as imaging tests, such as X-ray or MRI. Follow these instructions at home: Managing pain, stiffness, and swelling  If told, put ice on the painful area. Put ice in a plastic bag. Place a towel between your skin and the bag. Leave the ice on for 20 minutes, 2-3 times a day. If your skin turns bright red, remove the ice right away to prevent skin damage. The risk of damage is higher if you cannot feel pain, heat, or cold. Activity Do not stand or walk for long periods. Do stretches to relieve foot pain and stiffness as told by your provider. Do not lift anything that is heavier than 10 lb (4.5 kg), or the limit that you are told, until your provider says that it is safe. Lifting a lot of weight can put added pressure on your feet. Return to your normal activities as told by your provider. Ask your provider what activities are safe for you. Lifestyle Wear comfortable, supportive shoes that fit you well. Do not wear high heels. Keep your feet clean and dry. General instructions Take over-the-counter and prescription medicines only as told by your provider. Rub your foot gently. Pay attention to any changes in your symptoms. Let your provider know if symptoms become worse. Keep all follow-up visits. Your provider will want to monitor your progress. Contact a health care provider if: Your pain does not get better after a few days of treatment at home. Your pain gets worse. You cannot stand on your foot. Your foot or toes are swollen. Your foot is numb or tingling. Get help right away if: Your foot  or toes turn white or blue. You have warmth and redness along your foot. This information is not intended to replace advice given to you by your health care provider. Make sure you discuss any questions you have with your health care provider. Document Revised: 05/21/2022 Document Reviewed: 05/21/2022 [employer] Patient Education  2023 [employer]  

## 2023-01-15 NOTE — Progress Notes (Signed)
Patient ID: Denise Arias, female   DOB: 04-24-1981, 42 y.o.   MRN: [id number]     Denise Arias, is a 42 y.o. female  QMV:[id number]  MWU:[id number]  DOB - [birth year]  Chief Complaint  Patient presents with   Foot Injury   Hospitalization Follow-up       Subjective:   Denise Arias is a 42 y.o. female here today for a follow up visit after being seen at the ED 12/25/2022 for gastroenteritis.  She is feeling MUCH better but still having some very mild lingering epigastric discomfort that she has been having for months.  No further vomiting or diarrhea.    She is c/o L medial heel pain that occurs all day.  This has been going on for a few months.  She works 3rd shift.  Has 5 children.    From ED note Denise Arias is a 42 y.o. female.  With a history of allergies,.,  Anxiety, depression who presents to the ED for evaluation of epigastric pain.  This began yesterday and has progressively gotten worse.  She is having 3 total episodes of emesis as well.  This is nonbloody and nonbilious.  She has had numerous episodes of diarrhea.  Also nonbloody.  No melena or hematochezia.  She has taken Tums with no improvement in her symptoms.  Pain is described as a cramping sensation.  It does not radiate.  She did eat some beans and liver the day prior to symptom onset which she believes may be contributing.  She denies fevers, chest pain, shortness of breath, lower abdominal pain, urinary symptoms recent antibiotic use or travel.   Prescription drug management.   This patient presents to the ED for concern of nausea, vomiting, diarrhea, this involves an extensive number of treatment options, and is a complaint that carries with it a high risk of complications and morbidity. The differential diagnosis of diarrhea includes but is not limited to Viral- norovirus/rotavirus; Bacterial-Campylobacter,Shigella, Salmonella, Escherichia coli, E. coli 0157:H7, Yersinia enterocolitica, Vibrio cholerae, Clostridium  difficile. Parasitic- Giardia lamblia, Cryptosporidium,Entamoeba histolytica,Cyclospora, Microsporidium. Toxin- Staphylococcus aureus, Bacillus cereus. Noninfectious causes include GI Bleed, Appendicitis, Mesenteric Ischemia, Diverticulitis, Adrenal Crisis, Thyroid Storm, Toxicologic exposures, Antibiotic or drug-associated, inflammatory bowel disease.    Co morbidities that complicate the patient evaluation   anxiety, depression   My initial workup includes labs, fluids, nausea control   Additional history obtained from: Nursing notes from this visit.   I ordered, reviewed and interpreted labs which include: CBC, CMP, lipase, hCG, urinalysis.  CMP, CBC, hCG, lipase all within normal limits   Afebrile, hemodynamically stable.  42 year old female presenting to the ED for evaluation of generalized abdominal pain, nausea, vomiting and diarrhea.  Symptoms began shortly after eating beans and liver.  Gastroenteritis is the likely cause of her symptoms.  She reported significant improvement after IV fluids and Zofran.  She passed her p.o. challenge in the ED without difficulty.  I have low suspicion for acute emergent intra-abdominal abnormalities given her reassuring exam and workup.  Patient was sent a prescription for Zofran and encouraged to follow-up with her primary care provider in 1 week for reevaluation of her symptoms.  She was given strict return precautions.  Stable at discharge.   At this time there does not appear to be any evidence of an acute emergency medical condition and the patient appears stable for discharge with appropriate outpatient follow up. Diagnosis was discussed with patient who verbalizes understanding of care plan and is agreeable to discharge. I  have discussed return precautions with patient who verbalizes understanding. Patient encouraged to follow-up with their PCP within 2 week. All questions answered.  No problems updated.  ALLERGIES: Allergies  Allergen  Reactions   Molds & Smuts Anaphylaxis    Patient "unknown reaction but test stated I am allergic" Patient "unknown reaction but test stated I am allergic"    Amoxicillin Nausea And Vomiting   Banana Hives   Fish Allergy Other (See Comments)   Fish Oil Rash   Minocycline Rash and Hives    Rash per patient report. Rash per patient report.   Multivitamins Other (See Comments)    sd her stomach/intestinal area swelled up after 2 hrs. sd her stomach/intestinal area swelled up after 2 hrs.   Strawberry Extract Rash and Other (See Comments)   Tizanidine Other (See Comments)    Causes eyes to turn red. Causes eyes to turn red.   Tramadol Other (See Comments)    "Heart works fast" Eye become red "Heart works fast" Eye become red   Vitamin C Rash   Banana Extract Allergy Skin Test Other (See Comments)   Other Itching    "nuts with itching in ears, nose and throat" per patient   Fish Oil Rash    PAST MEDICAL HISTORY: Past Medical History:  Diagnosis Date   Allergy    RHINITIS   GERD (gastroesophageal reflux disease)    Neck pain    Palpitations     MEDICATIONS AT HOME: Prior to Admission medications   Medication Sig Start Date End Date Taking? Authorizing Provider  cetirizine (ZYRTEC) 10 MG tablet Take 1 tablet (10 mg total) by mouth at bedtime. 01/15/23   Anders Simmonds, PA-C  fluticasone (FLONASE) 50 MCG/ACT nasal spray Place 2 sprays into both nostrils daily. 01/15/23   Anders Simmonds, PA-C  meloxicam (MOBIC) 7.5 MG tablet Take 1 tablet (7.5 mg total) by mouth daily. Prn pain 01/15/23   Anders Simmonds, PA-C  methocarbamol (ROBAXIN) 500 MG tablet 1 to 2 tablets every 8 hours as needed for muscle spasm Patient not taking: Reported on 09/20/2022 06/18/22   Claiborne Rigg, NP  metroNIDAZOLE (FLAGYL) 500 MG tablet Take 1 tablet (500 mg total) by mouth 2 (two) times daily. 10/17/22   Rondel Baton, MD  ondansetron (ZOFRAN-ODT) 4 MG disintegrating tablet Take 1 tablet (4  mg total) by mouth every 8 (eight) hours as needed for nausea or vomiting. 12/25/22   Schutt, Edsel Petrin, PA-C    ROS: Neg HEENT Neg resp Neg cardiac Neg GU Neg psych Neg neuro  Objective:   Vitals:   01/15/23 1540  BP: 126/84  Pulse: (!) 59  SpO2: 97%  Weight: 204 lb 3.2 oz (92.6 kg)  Height: 5\' 5"  (1.651 m)   Exam General appearance : Awake, alert, not in any distress. Speech Clear. Not toxic looking HEENT: Atraumatic and Normocephalic Neck: Supple, no JVD. No cervical lymphadenopathy.  Chest: Good air entry bilaterally, CTAB.  No rales/rhonchi/wheezing CVS: S1 S2 regular, no murmurs.  Extremities: B/L Lower Ext shows no edema, both legs are warm to touch.  TTP medial to calcaneus L foot.  No visible abnormality Neurology: Awake alert, and oriented X 3, CN II-XII intact, Non focal Skin: No Rash  Data Review Lab Results  Component Value Date   HGBA1C 6.0 (H) 04/18/2022   HGBA1C 6.1 (H) 02/21/2020    Assessment & Plan   1. Epigastric discomfort Non acute abdomen - H. pylori breath test  2. Other allergic rhinitis - cetirizine (ZYRTEC) 10 MG tablet; Take 1 tablet (10 mg total) by mouth at bedtime.  Dispense: 30 tablet; Refill: 1 - fluticasone (FLONASE) 50 MCG/ACT nasal spray; Place 2 sprays into both nostrils daily.  Dispense: 16 g; Refill: 6  3. Dysfunction of both eustachian tubes - fluticasone (FLONASE) 50 MCG/ACT nasal spray; Place 2 sprays into both nostrils daily.  Dispense: 16 g; Refill: 6  4. Left foot pain Not consistent with PF.  Hopefully not medial nerve pain-will refer - meloxicam (MOBIC) 7.5 MG tablet; Take 1 tablet (7.5 mg total) by mouth daily. Prn pain  Dispense: 30 tablet; Refill: 1 - Ambulatory referral to Podiatry  5. Hospital discharge follow-up Doing well    Return if symptoms worsen or fail to improve.  The patient was given clear instructions to go to ER or return to medical center if symptoms don't improve, worsen or new problems  develop. The patient verbalized understanding. The patient was told to call to get lab results if they haven't heard anything in the next week.      Georgian Co, PA-C [facility] [place], [place] [phone]   01/15/2023, 3:54 PM

## 2023-01-17 LAB — H. PYLORI BREATH TEST: H pylori Breath Test: NEGATIVE

## 2023-01-23 ENCOUNTER — Ambulatory Visit: Payer: Medicaid Other | Admitting: Podiatry

## 2023-01-23 DIAGNOSIS — M778 Other enthesopathies, not elsewhere classified: Secondary | ICD-10-CM

## 2023-02-17 ENCOUNTER — Ambulatory Visit: Payer: Medicaid Other | Admitting: Podiatry

## 2023-02-24 ENCOUNTER — Ambulatory Visit: Payer: Medicaid Other | Admitting: Podiatry

## 2023-03-26 ENCOUNTER — Emergency Department (HOSPITAL_BASED_OUTPATIENT_CLINIC_OR_DEPARTMENT_OTHER)
Admission: EM | Admit: 2023-03-26 | Discharge: 2023-03-26 | Disposition: A | Discharge status: Home / Self Care | Payer: BC Managed Care – PPO | Attending: Emergency Medicine | Admitting: Emergency Medicine

## 2023-03-26 ENCOUNTER — Emergency Department (HOSPITAL_BASED_OUTPATIENT_CLINIC_OR_DEPARTMENT_OTHER): Payer: BC Managed Care – PPO

## 2023-03-26 ENCOUNTER — Other Ambulatory Visit: Payer: Self-pay

## 2023-03-26 ENCOUNTER — Ambulatory Visit: Payer: Self-pay

## 2023-03-26 DIAGNOSIS — R42 Dizziness and giddiness: Secondary | ICD-10-CM | POA: Diagnosis present

## 2023-03-26 LAB — BASIC METABOLIC PANEL
Anion gap: 7 (ref 5–15)
BUN: 12 mg/dL (ref 6–20)
CO2: 25 mmol/L (ref 22–32)
Calcium: 9.2 mg/dL (ref 8.9–10.3)
Chloride: 104 mmol/L (ref 98–111)
Creatinine, Ser: 0.63 mg/dL (ref 0.44–1.00)
GFR, Estimated: >60 mL/min (ref >60)
Glucose, Bld: 135 mg/dL — ABNORMAL HIGH (ref 70–99)
Potassium: 3.9 mmol/L (ref 3.5–5.1)
Sodium: 136 mmol/L (ref 135–145)

## 2023-03-26 LAB — CBC
HCT: 37.2 % (ref 36.0–46.0)
Hemoglobin: 12.3 g/dL (ref 12.0–15.0)
MCH: 28.7 pg (ref 26.0–34.0)
MCHC: 33.1 g/dL (ref 30.0–36.0)
MCV: 86.7 fL (ref 80.0–100.0)
Platelets: 269 10*3/uL (ref 150–400)
RBC: 4.29 MIL/uL (ref 3.87–5.11)
RDW: 13.3 % (ref 11.5–15.5)
WBC: 10.1 10*3/uL (ref 4.0–10.5)
nRBC: 0 % (ref 0.0–0.2)

## 2023-03-26 LAB — URINALYSIS, ROUTINE W REFLEX MICROSCOPIC
Bilirubin Urine: NEGATIVE
Glucose, UA: NEGATIVE mg/dL
Hgb urine dipstick: NEGATIVE
Ketones, ur: NEGATIVE mg/dL
Leukocytes,Ua: NEGATIVE
Nitrite: NEGATIVE
Protein, ur: NEGATIVE mg/dL
Specific Gravity, Urine: 1.021 (ref 1.005–1.030)
pH: 7 (ref 5.0–8.0)

## 2023-03-26 LAB — CBG MONITORING, ED: Glucose-Capillary: 105 mg/dL — ABNORMAL HIGH (ref 70–99)

## 2023-03-26 LAB — PREGNANCY, URINE: Preg Test, Ur: NEGATIVE

## 2023-03-26 NOTE — Discharge Instructions (Signed)
You were seen in the emergency department for dizziness and some blurred vision.  Your labs and imaging were unremarkable at this time.  I believe that your symptoms are likely related to episodes in which her blood sugars dropping as these typically occur and you are able to get your blood sugar back up which helps your symptoms improve.  Please follow-up with your primary care doctor for further evaluation.  If you have any acute worsening or symptoms, please return the emergency department.

## 2023-03-26 NOTE — ED Notes (Signed)
Pt. Didn't want to wait for discharge papers, stated she will return tomorrow for them.

## 2023-03-26 NOTE — ED Triage Notes (Signed)
Dizzy x5 days, +N/-V, and intermittent blurry vision, denies CP/SOB. No focal weakness in triage. Speech clear. A+Ox4.

## 2023-03-26 NOTE — Telephone Encounter (Signed)
Message from Badger S sent at 03/26/2023 11:35 AM EDT  Summary: Dizzyness, headache, nausea   The patient states for several days now she has felt very lethargic with bouts of dizziness. She has had a headache and nausea as well. Please assist patient further as she is wondering what she should do or if she needs to schedule an appt with her provider as soon as possible.         Chief Complaint: dizziness Symptoms: headache, nausea, feels like heart is beating fast, numbness to both thumbs that come and go, blurred vision that comes and goes, fatigue Frequency: 5 days Pertinent Negatives: Patient denies vomiting, facial weakness or numbness or arms , or leg weakness or numbness Disposition: [x] ED /[] Urgent Care (no appt availability in office) / [] Appointment(In office/virtual)/ []  [place] [facility]/ [] Home Care/ [] Refused Recommended Disposition /[] [facility] [facility]/ []  Follow-up with PCP Additional Notes: Unsure when pt will go  Advised pt to go as soon as possible and to leave work Reason for Disposition  Extra heartbeats, irregular heart beating, or heart is beating very fast  (i.e., "palpitations")  Heart beating < 50 beats per minute OR > 140 beats per minute  Answer Assessment - Initial Assessment Questions 1. DESCRIPTION: "Describe your dizziness."     Feels head  2. LIGHTHEADED: "Do you feel lightheaded?" (e.g., somewhat faint, woozy, weak upon standing)     Worse at night 3. VERTIGO: "Do you feel like either you or the room is spinning or tilting?" (i.e. vertigo)     no 4. SEVERITY: "How bad is it?"  "Do you feel like you are going to faint?" "Can you stand and walk?"   - MILD: Feels slightly dizzy, but walking normally.   - MODERATE: Feels unsteady when walking, but not falling; interferes with normal activities (e.g., school, work).   - SEVERE: Unable to walk without falling, or requires assistance to walk without falling; feels like passing out now.      mild 5.  ONSET:  "When did the dizziness begin?"     5 days  6. AGGRAVATING FACTORS: "Does anything make it worse?" (e.g., standing, change in head position)     In bed  standing 7. HEART RATE: "Can you tell me your heart rate?" "How many beats in 15 seconds?"  (Note: not all patients can do this)       Pt unable to do  8. CAUSE: "What do you think is causing the dizziness?"     Unsure  10. OTHER SYMPTOMS: "Do you have any other symptoms?" (e.g., fever, chest pain, vomiting, diarrhea, bleeding)       Blurred vision that comes and goes, dizziness, headache, nausea/ numbness left or and right thumb only, feels like heart is beating fast  Protocols used: Dizziness - Lightheadedness-A-AH

## 2023-03-26 NOTE — Telephone Encounter (Signed)
Noted  

## 2023-03-27 NOTE — ED Provider Notes (Signed)
[place] EMERGENCY DEPARTMENT AT [facility] Provider Note   CSN: [id number] Arrival date & time: 03/26/23  1635     History No chief complaint on file.   Denise Arias is a 42 y.o. female.  Patient presents to the emergency department complaints of dizziness and blurred vision intermittently.  Reports that she typically feels the dizziness and slight vision change, when she feels that her blood sugars running low.  Patient is prediabetic but denies currently taking any antiglycemic medication.  States that she does monitor her blood glucose at home and typically she begins to notice this dizziness feeling or lightheadedness feeling when she has glucose levels in the low 100s.  Denies any headache, nausea, vomiting, syncope.  HPI     Home Medications Prior to Admission medications   Medication Sig Start Date End Date Taking? Authorizing Provider  cetirizine (ZYRTEC) 10 MG tablet Take 1 tablet (10 mg total) by mouth at bedtime. 01/15/23   Anders Simmonds, PA-C  fluticasone (FLONASE) 50 MCG/ACT nasal spray Place 2 sprays into both nostrils daily. 01/15/23   Anders Simmonds, PA-C  meloxicam (MOBIC) 7.5 MG tablet Take 1 tablet (7.5 mg total) by mouth daily. Prn pain 01/15/23   Anders Simmonds, PA-C  methocarbamol (ROBAXIN) 500 MG tablet 1 to 2 tablets every 8 hours as needed for muscle spasm Patient not taking: Reported on 09/20/2022 06/18/22   Claiborne Rigg, NP  metroNIDAZOLE (FLAGYL) 500 MG tablet Take 1 tablet (500 mg total) by mouth 2 (two) times daily. 10/17/22   Rondel Baton, MD  ondansetron (ZOFRAN-ODT) 4 MG disintegrating tablet Take 1 tablet (4 mg total) by mouth every 8 (eight) hours as needed for nausea or vomiting. 12/25/22   Schutt, Edsel Petrin, PA-C      Allergies    Molds & smuts, Amoxicillin, Banana, Fish allergy, Fish oil, Minocycline, Multivitamins, Strawberry extract, Tizanidine, Tramadol, Vitamin c, Banana extract allergy skin test, Other, and Fish  oil    Review of Systems   Review of Systems  Neurological:  Positive for light-headedness.  All other systems reviewed and are negative.   Physical Exam Updated Vital Signs BP 122/68 (BP Location: Left Arm)   Pulse 75   Temp 97.8 F (36.6 C) (Oral)   Resp 16   SpO2 100%  Physical Exam Vitals and nursing note reviewed.  Constitutional:      General: She is not in acute distress.    Appearance: She is well-developed.  HENT:     Head: Normocephalic and atraumatic.  Eyes:     Conjunctiva/sclera: Conjunctivae normal.  Cardiovascular:     Rate and Rhythm: Normal rate and regular rhythm.     Heart sounds: No murmur heard. Pulmonary:     Effort: Pulmonary effort is normal. No respiratory distress.     Breath sounds: Normal breath sounds.  Abdominal:     Palpations: Abdomen is soft.     Tenderness: There is no abdominal tenderness.  Musculoskeletal:        General: No swelling.     Cervical back: Neck supple.  Skin:    General: Skin is warm and dry.     Capillary Refill: Capillary refill takes less than 2 seconds.  Neurological:     General: No focal deficit present.     Mental Status: She is alert. Mental status is at baseline.     Cranial Nerves: No cranial nerve deficit.     Motor: No weakness.  Gait: Gait normal.  Psychiatric:        Mood and Affect: Mood normal.     ED Results / Procedures / Treatments   Labs (all labs ordered are listed, but only abnormal results are displayed) Labs Reviewed  BASIC METABOLIC PANEL - Abnormal; Notable for the following components:      Result Value   Glucose, Bld 135 (*)    All other components within normal limits  CBG MONITORING, ED - Abnormal; Notable for the following components:   Glucose-Capillary 105 (*)    All other components within normal limits  CBC  URINALYSIS, ROUTINE W REFLEX MICROSCOPIC  PREGNANCY, URINE    EKG None  Radiology CT HEAD WO CONTRAST  Result Date: 03/26/2023 CLINICAL DATA:   Syncope/presyncope, cerebrovascular cause suspected EXAM: CT HEAD WITHOUT CONTRAST TECHNIQUE: Contiguous axial images were obtained from the base of the skull through the vertex without intravenous contrast. RADIATION DOSE REDUCTION: This exam was performed according to the departmental dose-optimization program which includes automated exposure control, adjustment of the mA and/or kV according to patient size and/or use of iterative reconstruction technique. COMPARISON:  CT Head 10/14/20 FINDINGS: Brain: No evidence of acute infarction, hemorrhage, hydrocephalus, extra-axial collection or mass lesion/mass effect. Vascular: No hyperdense vessel or unexpected calcification. Skull: Normal. Negative for fracture or focal lesion. Sinuses/Orbits: 2 no middle ear or mastoid effusion. Paranasal sinuses are clear. Orbits are unremarkable. Other: None. IMPRESSION: No CT etiology for dizziness identified. Electronically Signed   By: Lorenza Cambridge M.D.   On: 03/26/2023 17:40    Procedures Procedures   Medications Ordered in ED Medications - No data to display  ED Course/ Medical Decision Making/ A&P                           Medical Decision Making Amount and/or Complexity of Data Reviewed Labs: ordered. Radiology: ordered.   This patient presents to the ED for concern of dizziness/blurry vision.  Differential diagnosis includes stroke, hypoglycemia, syncope, MI   Lab Tests:  I Ordered, and personally interpreted labs.  The pertinent results include: CBC unremarkable, CMP with glucose at 135, UA without evidence of infection, urine pregnancy negative   Imaging Studies ordered:  I ordered imaging studies including CT head I independently visualized and interpreted imaging which showed no acute intracranial process I agree with the radiologist interpretation   Problem List / ED Course:  Patient presents to the emergency department complaints of dizziness and blurred vision.  On further evaluation  patient symptoms, appears that she feels weak and lightheaded particularly when her blood glucose levels dropping to the low 100s.  Patient is prediabetic but denies any treatment with any antihyperglycemic medication.  States that she does monitor her blood glucose levels 2 times a day per recommendations by primary care provider.  She also reports that when she notices that her glucose is at this level and has any symptoms she will consume a high sugar food and typically has resolution of symptoms within 30 minutes. Lab workup initiated which was largely unremarkable.  No signs of infection patient's glucose is slightly elevated at 135. CT head was also negative for any acute abnormalities.  Do not believe this is related to a stroke or other acute neurological findings. Symptoms appear to be highly consistent with subjective hypoglycemia as patient is experiencing symptoms with blood glucose levels in the 100s per her home glucometer.  Patient not currently reporting any symptoms in the  emergency department.  Advised patient to seek follow-up with primary care provider for further evaluation of A1c other metric of diabetic status as patient may be having early signs of diabetes or poorly managed. Low concern for stroke or other neurological abnormality as it appears patient's symptoms are related to glucose levels dropping on home BG monitor. Patient is agreeable to treatment plan and verbalized understanding all return precautions. Based on glucose levels today, no need for any acute intervention needed for severe hyperglycemia. All questions answered prior to patient discharge.  Final Clinical Impression(s) / ED Diagnoses Final diagnoses:  Episodic lightheadedness    Rx / DC Orders ED Discharge Orders     None         Smitty Knudsen, PA-C 03/27/23 0016    Sloan Leiter, DO 03/28/23 1514

## 2023-05-07 ENCOUNTER — Encounter: Payer: Self-pay | Admitting: Physician Assistant

## 2023-05-07 ENCOUNTER — Ambulatory Visit: Payer: BC Managed Care – PPO | Attending: Physician Assistant | Admitting: Physician Assistant

## 2023-05-07 VITALS — BP 128/84 | HR 80 | Wt 208.6 lb

## 2023-05-07 DIAGNOSIS — R519 Headache, unspecified: Secondary | ICD-10-CM

## 2023-05-07 DIAGNOSIS — M542 Cervicalgia: Secondary | ICD-10-CM

## 2023-05-07 DIAGNOSIS — H6993 Unspecified Eustachian tube disorder, bilateral: Secondary | ICD-10-CM

## 2023-05-07 DIAGNOSIS — M25519 Pain in unspecified shoulder: Secondary | ICD-10-CM

## 2023-05-07 DIAGNOSIS — M79672 Pain in left foot: Secondary | ICD-10-CM

## 2023-05-07 DIAGNOSIS — Z09 Encounter for follow-up examination after completed treatment for conditions other than malignant neoplasm: Secondary | ICD-10-CM

## 2023-05-07 DIAGNOSIS — H539 Unspecified visual disturbance: Secondary | ICD-10-CM

## 2023-05-07 DIAGNOSIS — R7303 Prediabetes: Secondary | ICD-10-CM

## 2023-05-07 DIAGNOSIS — J3089 Other allergic rhinitis: Secondary | ICD-10-CM

## 2023-05-07 DIAGNOSIS — M62838 Other muscle spasm: Secondary | ICD-10-CM

## 2023-05-07 MED ORDER — FLUTICASONE PROPIONATE 50 MCG/ACT NA SUSP
2.0000 | Freq: Every day | NASAL | 6 refills | Status: DC
Start: 2023-05-07 — End: 2023-10-03

## 2023-05-07 MED ORDER — MELOXICAM 7.5 MG PO TABS: 7.5000 mg | ORAL_TABLET | Freq: Every day | ORAL | 1 refills | Status: DC

## 2023-05-07 MED ORDER — CETIRIZINE HCL 10 MG PO TABS
10.0000 mg | ORAL_TABLET | Freq: Every day | ORAL | 1 refills | Status: DC
Start: 2023-05-07 — End: 2023-06-25

## 2023-05-07 MED ORDER — METHOCARBAMOL 500 MG PO TABS: ORAL_TABLET | ORAL | 1 refills | Status: DC

## 2023-05-07 NOTE — Progress Notes (Signed)
Patient ID: Denise Arias, female   DOB: April 23, 1981, 42 y.o.   MRN: [id number]   Denise Arias, is a 42 y.o. female  WUJ:[id number]  [id number]  DOB - 1981/01/19  Chief Complaint  Patient presents with   Prediabetes       Subjective:   Denise Arias is a 42 y.o. female here today for a follow up visit after being seen in the ED with what she is calling hypoglycemia.  She was having dizziness and vision changes.  She is starting to feel like she needs to wear glasses.  She admits to poor water intake.  She is calling blood sugar of 125 low and she likes her blood sugar to be higher than that.  She is also concerned with weight gain.  Eats a lot of starchy food and drinks juices.    Her HA/muscle spasms and foot pains are improved on medications.  Zyrtec and flonase helping with congestion and allergies    From  ED visit 03/26/2023 Marialis Kuder is a 42 y.o. female.  Patient presents to the emergency department complaints of dizziness and blurred vision intermittently.  Reports that she typically feels the dizziness and slight vision change, when she feels that her blood sugars running low.  Patient is prediabetic but denies currently taking any antiglycemic medication.  States that she does monitor her blood glucose at home and typically she begins to notice this dizziness feeling or lightheadedness feeling when she has glucose levels in the low 100s.  Denies any headache, nausea, vomiting, syncope.   Patient presents to the emergency department complaints of dizziness and blurred vision.  On further evaluation patient symptoms, appears that she feels weak and lightheaded particularly when her blood glucose levels dropping to the low 100s.  Patient is prediabetic but denies any treatment with any antihyperglycemic medication.  States that she does monitor her blood glucose levels 2 times a day per recommendations by primary care provider.  She also reports that when she notices that her  glucose is at this level and has any symptoms she will consume a high sugar food and typically has resolution of symptoms within 30 minutes. Lab workup initiated which was largely unremarkable.  No signs of infection patient's glucose is slightly elevated at 135. CT head was also negative for any acute abnormalities.  Do not believe this is related to a stroke or other acute neurological findings. Symptoms appear to be highly consistent with subjective hypoglycemia as patient is experiencing symptoms with blood glucose levels in the 100s per her home glucometer.  Patient not currently reporting any symptoms in the emergency department.  Advised patient to seek follow-up with primary care provider for further evaluation of A1c other metric of diabetic status as patient may be having early signs of diabetes or poorly managed. Low concern for stroke or other neurological abnormality as it appears patient's symptoms are related to glucose levels dropping on home BG monitor. Patient is agreeable to treatment plan and verbalized understanding all return precautions. Based on glucose levels today, no need for any acute intervention needed for severe hyperglycemia. All questions answered prior to patient discharge. No problems updated.  ALLERGIES: Allergies  Allergen Reactions   Molds & Smuts Anaphylaxis    Patient "unknown reaction but test stated I am allergic" Patient "unknown reaction but test stated I am allergic"    Amoxicillin Nausea And Vomiting   Banana Hives   Fish Allergy Other (See Comments)   Fish Oil Rash   Minocycline  Rash and Hives    Rash per patient report. Rash per patient report.   Multivitamins Other (See Comments)    sd her stomach/intestinal area swelled up after 2 hrs. sd her stomach/intestinal area swelled up after 2 hrs.   Strawberry Extract Rash and Other (See Comments)   Tizanidine Other (See Comments)    Causes eyes to turn red. Causes eyes to turn red.   Tramadol  Other (See Comments)    "Heart works fast" Eye become red "Heart works fast" Eye become red   Vitamin C Rash   Banana Extract Allergy Skin Test Other (See Comments)   Other Itching    "nuts with itching in ears, nose and throat" per patient   Fish Oil Rash    PAST MEDICAL HISTORY: Past Medical History:  Diagnosis Date   Allergy    RHINITIS   GERD (gastroesophageal reflux disease)    Neck pain    Palpitations     MEDICATIONS AT HOME: Prior to Admission medications   Medication Sig Start Date End Date Taking? Authorizing Provider  ondansetron (ZOFRAN-ODT) 4 MG disintegrating tablet Take 1 tablet (4 mg total) by mouth every 8 (eight) hours as needed for nausea or vomiting. 12/25/22  Yes Schutt, Edsel Petrin, PA-C  cetirizine (ZYRTEC) 10 MG tablet Take 1 tablet (10 mg total) by mouth at bedtime. 05/07/23   Anders Simmonds, PA-C  fluticasone (FLONASE) 50 MCG/ACT nasal spray Place 2 sprays into both nostrils daily. 05/07/23   Anders Simmonds, PA-C  meloxicam (MOBIC) 7.5 MG tablet Take 1 tablet (7.5 mg total) by mouth daily. Prn pain 05/07/23   Anders Simmonds, PA-C  methocarbamol (ROBAXIN) 500 MG tablet 1 to 2 tablets every 8 hours as needed for muscle spasm 05/07/23   Anders Simmonds, PA-C    ROS: Neg HEENT Neg resp Neg cardiac Neg GI Neg GU Neg MS Neg psych Neg neuro  Objective:   Vitals:   05/07/23 1546  BP: 128/84  Pulse: 80  SpO2: 99%  Weight: 208 lb 9.6 oz (94.6 kg)   Exam General appearance : Awake, alert, not in any distress. Speech Clear. Not toxic looking HEENT: Atraumatic and Normocephalic Neck: Supple, no JVD. No cervical lymphadenopathy.  Chest: Good air entry bilaterally, CTAB.  No rales/rhonchi/wheezing CVS: S1 S2 regular, no murmurs.  Extremities: B/L Lower Ext shows no edema, both legs are warm to touch Neurology: Awake alert, and oriented X 3, CN II-XII intact, Non focal Skin: No Rash  Data Review Lab Results  Component Value Date   HGBA1C 6.0  (H) 04/18/2022   HGBA1C 6.1 (H) 02/21/2020    Assessment & Plan   1. Prediabetes  I have advised the patient to work at a goal of eliminating sugary drinks, candy, desserts, sweets, refined sugars, processed foods, and white carbohydrates.  The patient expresses understanding. This will also help weight loss.  Educated about normal, low and high blood sugars - Hemoglobin A1c - Ambulatory referral to Ophthalmology  2. Other allergic rhinitis - fluticasone (FLONASE) 50 MCG/ACT nasal spray; Place 2 sprays into both nostrils daily.  Dispense: 16 g; Refill: 6 - cetirizine (ZYRTEC) 10 MG tablet; Take 1 tablet (10 mg total) by mouth at bedtime.  Dispense: 30 tablet; Refill: 1  3. Dysfunction of both eustachian tubes - fluticasone (FLONASE) 50 MCG/ACT nasal spray; Place 2 sprays into both nostrils daily.  Dispense: 16 g; Refill: 6  4. Left foot pain - meloxicam (MOBIC) 7.5 MG tablet; Take 1  tablet (7.5 mg total) by mouth daily. Prn pain  Dispense: 30 tablet; Refill: 1  5. Frequent headaches - methocarbamol (ROBAXIN) 500 MG tablet; 1 to 2 tablets every 8 hours as needed for muscle spasm  Dispense: 90 tablet; Refill: 1  6. Muscle spasm - methocarbamol (ROBAXIN) 500 MG tablet; 1 to 2 tablets every 8 hours as needed for muscle spasm  Dispense: 90 tablet; Refill: 1  7. Neck and shoulder pain - methocarbamol (ROBAXIN) 500 MG tablet; 1 to 2 tablets every 8 hours as needed for muscle spasm  Dispense: 90 tablet; Refill: 1  8. Vision changes - Ambulatory referral to Ophthalmology  9. Encounter for examination following treatment at hospital Increase water intake    Return in about 4 months (around 09/06/2023) for PCP for chronic conditions-[place].  The patient was given clear instructions to go to ER or return to medical center if symptoms don't improve, worsen or new problems develop. The patient verbalized understanding. The patient was told to call to get lab results if they haven't heard  anything in the next week.      Georgian Co, PA-C [facility] [place], [place] [phone]   05/07/2023, 4:03 PM

## 2023-05-07 NOTE — Patient Instructions (Signed)
Dizziness Dizziness is a common problem. It makes you feel unsteady or light-headed. You may feel like you are about to pass out (faint). Dizziness can lead to getting hurt if you stumble or fall. Dizziness can be caused by many things, including: Medicines. Not having enough water in your body (dehydration). Illness. Follow these instructions at home: Eating and drinking  Drink enough fluid to keep your pee (urine) pale yellow. This helps to keep you from getting dehydrated. Try to drink more clear fluids, such as water. Do not drink alcohol. Limit how much caffeine you drink or eat, if your doctor tells you to do that. Limit how much salt (sodium) you drink or eat, if your doctor tells you to do that. Activity  Avoid making quick movements. Stand up slowly from sitting in a chair, and steady yourself until you feel okay. In the morning, first sit up on the side of the bed. When you feel okay, stand up slowly while you hold onto something. Do this until you know that your balance is okay. If you need to stand in one place for a long time, move your legs often. Tighten and relax the muscles in your legs while you are standing. Do not drive or use machinery if you feel dizzy. Avoid bending down if you feel dizzy. Place items in your home so you can reach them easily without leaning over. Lifestyle Do not smoke or use any products that contain nicotine or tobacco. If you need help quitting, ask your doctor. Try to lower your stress level. You can do this by using methods such as yoga or meditation. Talk with your doctor if you need help. General instructions Watch your dizziness for any changes. Take over-the-counter and prescription medicines only as told by your doctor. Talk with your doctor if you think that you are dizzy because of a medicine that you are taking. Tell a friend or a family member that you are feeling dizzy. If he or she notices any changes in your behavior, have this  person call your doctor. Keep all follow-up visits. Contact a doctor if: Your dizziness does not go away. Your dizziness or light-headedness gets worse. You feel like you may vomit (are nauseous). You have trouble hearing. You have new symptoms. You are unsteady on your feet. You feel like the room is spinning. You have neck pain or a stiff neck. You have a fever. Get help right away if: You vomit or have watery poop (diarrhea), and you cannot eat or drink anything. You have trouble: Talking. Walking. Swallowing. Using your arms, hands, or legs. You feel generally weak. You are not thinking clearly, or you have trouble forming sentences. A friend or family member may notice this. You have: Chest pain. Pain in your belly (abdomen). Shortness of breath. Sweating. Your vision changes. You are bleeding. You have a very bad headache. These symptoms may be an emergency. Get help right away. Call your local emergency services (911 in the [place] Do not wait to see if the symptoms will go away. Do not drive yourself to the hospital. Summary Dizziness makes you feel unsteady or light-headed. You may feel like you are about to pass out (faint). Drink enough fluid to keep your pee (urine) pale yellow. Do not drink alcohol. Avoid making quick movements if you feel dizzy. Watch your dizziness for any changes. This information is not intended to replace advice given to you by your health care provider. Make sure you discuss any questions   you have with your health care provider. Document Revised: 07/21/2020 Document Reviewed: 07/24/2020 Elsevier Patient Education  2024 Elsevier Inc.  

## 2023-05-08 ENCOUNTER — Other Ambulatory Visit: Payer: Self-pay | Admitting: Physician Assistant

## 2023-05-08 LAB — HEMOGLOBIN A1C
Est. average glucose Bld gHb Est-mCnc: 137 mg/dL
Hgb A1c MFr Bld: 6.4 % — ABNORMAL HIGH (ref 4.8–5.6)

## 2023-05-08 MED ORDER — METFORMIN HCL 500 MG PO TABS: 500.0000 mg | ORAL_TABLET | Freq: Two times a day (BID) | ORAL | 3 refills | Status: DC

## 2023-05-09 ENCOUNTER — Telehealth: Payer: Self-pay

## 2023-05-09 NOTE — Telephone Encounter (Signed)
Pt was called and vm was left, Information has been sent to nurse pool.   

## 2023-05-09 NOTE — Telephone Encounter (Signed)
-----   Message from Georgian Co sent at 05/08/2023  1:26 PM EDT ----- You are now in the diabetes range. Work at a goal of eliminating sugary drinks, candy, desserts, sweets, refined sugars, processed foods, and white carbohydrates.  I am also starting you on metformin to help with blood sugar control.  Drink 80 ounces water daily.  Thanks, Georgian Co, PA-C

## 2023-05-19 ENCOUNTER — Ambulatory Visit: Payer: Self-pay

## 2023-05-19 NOTE — Telephone Encounter (Signed)
Patient called, left VM to return the call to the office to speak to the NT.    Summary: pain with sex   Pt called saying when she has sex with her husband she has pain.  Please advise.  CB#  [phone]

## 2023-05-19 NOTE — Telephone Encounter (Signed)
  Chief Complaint: Vaginal pain with intercourse Symptoms: stabbing pain with intercourse Frequency: 2 months Pertinent Negatives: Patient denies discharge odor Disposition: [] ED /[] Urgent Care (no appt availability in office) / [x] Appointment(In office/virtual)/ []  [facility]/ [] Home Care/ [] Refused Recommended Disposition /[] [facility]/ []  Follow-up with PCP Additional Notes: Pt states that for the past 2 months she has sharp stabbing pain in vagina when she has intercourse. No bleeding, discharge or smell. Pt would like to be worked in this week with a female provider. Summary: pain with sex   Pt called saying when she has sex with her husband she has pain.  Please advise.  CB#  [phone]     Reason for Disposition  Pain with sexual intercourse (dyspareunia)  (Exception: Feels like prior yeast infection, minor abrasion, minor rash < 24 hour duration, mild itching.)  Answer Assessment - Initial Assessment Questions 1. SYMPTOM: "What's the main symptom you're concerned about?" (e.g., pain, itching, dryness)     Pain with intercourse 2. LOCATION: "Where is the  pain located?" (e.g., inside/outside, left/right)     Inside vagina 3. ONSET: "When did the  pain  start?"     2 months 4. PAIN: "Is there any pain?" If Yes, ask: "How bad is it?" (Scale: 1-10; mild, moderate, severe)   -  MILD (1-3): Doesn't interfere with normal activities.    -  MODERATE (4-7): Interferes with normal activities (e.g., work or school) or awakens from sleep.     -  SEVERE (8-10): Excruciating pain, unable to do any normal activities.     Moderate 5. ITCHING: "Is there any itching?" If Yes, ask: "How bad is it?" (Scale: 1-10; mild, moderate, severe)     no 6. CAUSE: "What do you think is causing the discharge?" "Have you had the same problem before? What happened then?"     No discharge 7. OTHER SYMPTOMS: "Do you have any other symptoms?" (e.g., fever, itching, vaginal bleeding,  pain with urination, injury to genital area, vaginal foreign body)     no  Protocols used: Vaginal Symptoms-A-AH

## 2023-05-20 NOTE — Telephone Encounter (Signed)
Call placed to patient unable to reach message left on VM. Call to offer appointment

## 2023-05-20 NOTE — Telephone Encounter (Signed)
Call placed to patient unable to reach message left on VM.   

## 2023-05-21 ENCOUNTER — Telehealth: Payer: Self-pay

## 2023-05-21 NOTE — Telephone Encounter (Signed)
Copied from CRM [id number]. Topic: Appointment Scheduling - Scheduling Inquiry for Clinic >> May 21, 2023  1:08 PM Phill Myron wrote: ATTN: Vic Blackbird, RN Ms. Bedsworth is returning your call for an appointment sooner than October.  She stated she is at work and just in case she can not answer, if possible can you schedule her something after 3pm and later, she gets off work at [employer]

## 2023-05-22 NOTE — Telephone Encounter (Signed)
Call placed to patient unable to reach message left on VM.

## 2023-05-23 NOTE — Telephone Encounter (Addendum)
Letter sent to patient Via mychart and mail.

## 2023-05-23 NOTE — Telephone Encounter (Signed)
Call placed to patient unable to reach message left on VM.

## 2023-06-25 ENCOUNTER — Encounter: Payer: Self-pay | Admitting: Nurse Practitioner

## 2023-06-25 ENCOUNTER — Other Ambulatory Visit (HOSPITAL_COMMUNITY)
Admission: RE | Admit: 2023-06-25 | Discharge: 2023-06-25 | Disposition: A | Discharge status: Home / Self Care | Payer: BC Managed Care – PPO | Source: Ambulatory Visit | Attending: Nurse Practitioner | Admitting: Nurse Practitioner

## 2023-06-25 ENCOUNTER — Ambulatory Visit: Payer: BC Managed Care – PPO | Attending: Nurse Practitioner | Admitting: Nurse Practitioner

## 2023-06-25 VITALS — BP 127/85 | HR 69 | Ht 65.0 in | Wt 206.0 lb

## 2023-06-25 DIAGNOSIS — N941 Unspecified dyspareunia: Secondary | ICD-10-CM | POA: Insufficient documentation

## 2023-06-25 DIAGNOSIS — R198 Other specified symptoms and signs involving the digestive system and abdomen: Secondary | ICD-10-CM | POA: Diagnosis not present

## 2023-06-25 DIAGNOSIS — J302 Other seasonal allergic rhinitis: Secondary | ICD-10-CM

## 2023-06-25 DIAGNOSIS — J3089 Other allergic rhinitis: Secondary | ICD-10-CM | POA: Diagnosis not present

## 2023-06-25 DIAGNOSIS — H1010 Acute atopic conjunctivitis, unspecified eye: Secondary | ICD-10-CM

## 2023-06-25 MED ORDER — LORATADINE 10 MG PO TABS
10.0000 mg | ORAL_TABLET | Freq: Every day | ORAL | 3 refills | Status: DC
Start: 2023-06-25 — End: 2023-10-03

## 2023-06-25 NOTE — Progress Notes (Signed)
Assessment & Plan:  Denise Arias was seen today for vaginal pain.  Diagnoses and all orders for this visit:  Dyspareunia, female -     US PELVIC COMPLETE WITH TRANSVAGINAL; Future -     Cervicovaginal ancillary only  Seasonal and perennial allergic rhinoconjunctivitis -     loratadine (CLARITIN) 10 MG tablet; Take 1 tablet (10 mg total) by mouth daily.  Increased abdominal girth -     Ambulatory referral to Plastic Surgery    Patient has been counseled on age-appropriate routine health concerns for screening and prevention. These are reviewed and up-to-date. Referrals have been placed accordingly. Immunizations are up-to-date or declined.    Subjective:   Chief Complaint  Patient presents with   Vaginal Pain    Denise Arias 42 y.o. female presents to office today for dyspareunia She is a patient of Dr. Laural Benes   She is endorsing pain with sexual intercourse. Pain is experienced in any sexual position. She endorses regular menstrual periods.   She is requesting a referral to plastics to discuss liposuction vs tummy tuck.     Review of Systems  Constitutional:  Negative for fever, malaise/fatigue and weight loss.  HENT: Negative.  Negative for nosebleeds.   Eyes:  Positive for discharge and redness. Negative for blurred vision, double vision, photophobia and pain.  Respiratory: Negative.  Negative for cough and shortness of breath.   Cardiovascular: Negative.  Negative for chest pain, palpitations and leg swelling.  Gastrointestinal: Negative.  Negative for heartburn, nausea and vomiting.  Genitourinary:        SEE HPI  Musculoskeletal: Negative.  Negative for myalgias.  Neurological: Negative.  Negative for dizziness, focal weakness, seizures and headaches.  Psychiatric/Behavioral: Negative.  Negative for suicidal ideas.     Past Medical History:  Diagnosis Date   Allergy    RHINITIS   GERD (gastroesophageal reflux disease)    Neck pain    Palpitations     Past  Surgical History:  Procedure Laterality Date   BREAST REDUCTION SURGERY Bilateral 11/21/2020   Procedure: MAMMARY REDUCTION  (BREAST);  Surgeon: [name], MD;  Location: [facility];  Service: Plastics;  Laterality: Bilateral;  2 hours   NO PAST SURGERIES     VAGINAL DELIVERY     X 4    Family History  Problem Relation Age of Onset   Diabetes Mother    Hypertension Mother    Other Father        unsure of medical history   Thyroid disease Sister        goiter   Diabetes Sister    Other Brother        murdered   Asthma Maternal Uncle     Social History Reviewed with no changes to be made today.   Outpatient Medications Prior to Visit  Medication Sig Dispense Refill   fluticasone (FLONASE) 50 MCG/ACT nasal spray Place 2 sprays into both nostrils daily. 16 g 6   cetirizine (ZYRTEC) 10 MG tablet Take 1 tablet (10 mg total) by mouth at bedtime. 30 tablet 1   meloxicam (MOBIC) 7.5 MG tablet Take 1 tablet (7.5 mg total) by mouth daily. Prn pain (Patient not taking: Reported on 06/25/2023) 30 tablet 1   methocarbamol (ROBAXIN) 500 MG tablet 1 to 2 tablets every 8 hours as needed for muscle spasm (Patient not taking: Reported on 06/25/2023) 90 tablet 1   ondansetron (ZOFRAN-ODT) 4 MG disintegrating tablet Take 1 tablet (4 mg total) by  mouth every 8 (eight) hours as needed for nausea or vomiting. (Patient not taking: Reported on 06/25/2023) 20 tablet 0   metFORMIN (GLUCOPHAGE) 500 MG tablet Take 1 tablet (500 mg total) by mouth 2 (two) times daily with a meal. (Patient not taking: Reported on 06/25/2023) 180 tablet 3   No facility-administered medications prior to visit.    Allergies  Allergen Reactions   Molds & Smuts Anaphylaxis    Patient "unknown reaction but test stated I am allergic" Patient "unknown reaction but test stated I am allergic"    Amoxicillin Nausea And Vomiting   Banana Hives   Fish Allergy Other (See Comments)   Fish Oil Rash   Minocycline  Rash and Hives    Rash per patient report. Rash per patient report.   Multivitamins Other (See Comments)    sd her stomach/intestinal area swelled up after 2 hrs. sd her stomach/intestinal area swelled up after 2 hrs.   Strawberry Extract Rash and Other (See Comments)   Tizanidine Other (See Comments)    Causes eyes to turn red. Causes eyes to turn red.   Tramadol Other (See Comments)    "Heart works fast" Eye become red "Heart works fast" Eye become red   Vitamin C Rash   Banana Extract Allergy Skin Test Other (See Comments)   Other Itching    "nuts with itching in ears, nose and throat" per patient   Fish Oil Rash       Objective:    BP 127/85 (BP Location: Left Arm, Patient Position: Sitting, Cuff Size: Normal)   Pulse 69   Ht 5\' 5"  (1.651 m)   Wt 206 lb (93.4 kg)   LMP 06/12/2023 (Exact Date)   SpO2 98%   BMI 34.28 kg/m  Wt Readings from Last 3 Encounters:  06/25/23 206 lb (93.4 kg)  05/07/23 208 lb 9.6 oz (94.6 kg)  01/15/23 204 lb 3.2 oz (92.6 kg)    Physical Exam Vitals and nursing note reviewed.  Constitutional:      Appearance: She is well-developed.  HENT:     Head: Normocephalic and atraumatic.  Cardiovascular:     Rate and Rhythm: Normal rate and regular rhythm.     Heart sounds: Normal heart sounds. No murmur heard.    No friction rub. No gallop.  Pulmonary:     Effort: Pulmonary effort is normal. No tachypnea or respiratory distress.     Breath sounds: Normal breath sounds. No decreased breath sounds, wheezing, rhonchi or rales.  Chest:     Chest wall: No tenderness.  Abdominal:     General: Bowel sounds are normal.     Palpations: Abdomen is soft.  Musculoskeletal:        General: Normal range of motion.     Cervical back: Normal range of motion.  Skin:    General: Skin is warm and dry.  Neurological:     Mental Status: She is alert and oriented to person, place, and time.     Coordination: Coordination normal.  Psychiatric:         Behavior: Behavior normal. Behavior is cooperative.        Thought Content: Thought content normal.        Judgment: Judgment normal.          Patient has been counseled extensively about nutrition and exercise as well as the importance of adherence with medications and regular follow-up. The patient was given clear instructions to go to ER or return to medical  center if symptoms don't improve, worsen or new problems develop. The patient verbalized understanding.   Follow-up: Return in about 3 months (around 09/25/2023).   Claiborne Rigg, FNP-BC [facility] [place], [place] [phone]   06/25/2023, 4:42 PM

## 2023-06-30 ENCOUNTER — Encounter: Payer: Self-pay | Admitting: Nurse Practitioner

## 2023-06-30 LAB — CERVICOVAGINAL ANCILLARY ONLY
Bacterial Vaginitis (gardnerella): NEGATIVE
Candida Glabrata: NEGATIVE
Candida Vaginitis: NEGATIVE
Chlamydia: NEGATIVE
Comment: NEGATIVE
Comment: NEGATIVE
Comment: NORMAL
Comment: NEGATIVE
Comment: NEGATIVE
Comment: NEGATIVE
Neisseria Gonorrhea: NEGATIVE
Trichomonas: NEGATIVE

## 2023-07-01 ENCOUNTER — Ambulatory Visit
Admission: RE | Admit: 2023-07-01 | Discharge: 2023-07-01 | Disposition: A | Discharge status: Home / Self Care | Payer: BC Managed Care – PPO | Source: Ambulatory Visit | Attending: Nurse Practitioner | Admitting: Nurse Practitioner

## 2023-07-01 DIAGNOSIS — N941 Unspecified dyspareunia: Secondary | ICD-10-CM

## 2023-07-07 ENCOUNTER — Telehealth: Payer: Self-pay | Admitting: Internal Medicine

## 2023-07-07 NOTE — Telephone Encounter (Signed)
Pt is calling for US PELVIC COMPLETE WITH TRANSVAGINAL (Accession [id number]) (Order [id number])  results.   Pt is calling to request referral to plastic surgery to be finalized.  Please advise CB- [phone]

## 2023-07-07 NOTE — Telephone Encounter (Signed)
Reviewed with patient results of US PELVIC COMPLETE WITH TRANSVAGINAL  per PCP "Normal pelvic Ultrasound " also made aware that REFERRAL TO PLASTIC SURGERY has been sent.

## 2023-07-25 ENCOUNTER — Telehealth: Payer: Self-pay

## 2023-07-25 NOTE — Telephone Encounter (Signed)
Copied from CRM [id number]. Topic: Referral - Status >> Jul 25, 2023  8:39 AM Franchot Heidelberg wrote: Reason for CRM: Pt called for the status of her plastic surgery referral

## 2023-09-17 ENCOUNTER — Ambulatory Visit (HOSPITAL_COMMUNITY)
Admission: EM | Admit: 2023-09-17 | Discharge: 2023-09-17 | Disposition: A | Discharge status: Home / Self Care | Payer: BC Managed Care – PPO | Attending: Family Medicine | Admitting: Family Medicine

## 2023-09-17 ENCOUNTER — Encounter (HOSPITAL_COMMUNITY): Payer: Self-pay | Admitting: Emergency Medicine

## 2023-09-17 DIAGNOSIS — R112 Nausea with vomiting, unspecified: Secondary | ICD-10-CM | POA: Insufficient documentation

## 2023-09-17 DIAGNOSIS — R1013 Epigastric pain: Secondary | ICD-10-CM | POA: Diagnosis not present

## 2023-09-17 DIAGNOSIS — R197 Diarrhea, unspecified: Secondary | ICD-10-CM | POA: Diagnosis present

## 2023-09-17 DIAGNOSIS — E860 Dehydration: Secondary | ICD-10-CM | POA: Insufficient documentation

## 2023-09-17 LAB — CBC WITH DIFFERENTIAL/PLATELET
Abs Immature Granulocytes: 0.02 10*3/uL (ref 0.00–0.07)
Basophils Absolute: 0 10*3/uL (ref 0.0–0.1)
Basophils Relative: 1 %
Eosinophils Absolute: 0.3 10*3/uL (ref 0.0–0.5)
Eosinophils Relative: 4 %
HCT: 37.6 % (ref 36.0–46.0)
Hemoglobin: 12.4 g/dL (ref 12.0–15.0)
Immature Granulocytes: 0 %
Lymphocytes Relative: 16 %
Lymphs Abs: 1.1 10*3/uL (ref 0.7–4.0)
MCH: 28.3 pg (ref 26.0–34.0)
MCHC: 33 g/dL (ref 30.0–36.0)
MCV: 85.8 fL (ref 80.0–100.0)
Monocytes Absolute: 1.2 10*3/uL — ABNORMAL HIGH (ref 0.1–1.0)
Monocytes Relative: 18 %
Neutro Abs: 4.2 10*3/uL (ref 1.7–7.7)
Neutrophils Relative %: 61 %
Platelets: 233 10*3/uL (ref 150–400)
RBC: 4.38 MIL/uL (ref 3.87–5.11)
RDW: 13.6 % (ref 11.5–15.5)
WBC: 6.9 10*3/uL (ref 4.0–10.5)
nRBC: 0 % (ref 0.0–0.2)

## 2023-09-17 LAB — POCT URINE PREGNANCY: Preg Test, Ur: NEGATIVE

## 2023-09-17 LAB — COMPREHENSIVE METABOLIC PANEL
ALT: 22 U/L (ref 0–44)
AST: 24 U/L (ref 15–41)
Albumin: 3.4 g/dL — ABNORMAL LOW (ref 3.5–5.0)
Alkaline Phosphatase: 50 U/L (ref 38–126)
Anion gap: 9 (ref 5–15)
BUN: 10 mg/dL (ref 6–20)
CO2: 24 mmol/L (ref 22–32)
Calcium: 8.8 mg/dL — ABNORMAL LOW (ref 8.9–10.3)
Chloride: 104 mmol/L (ref 98–111)
Creatinine, Ser: 0.56 mg/dL (ref 0.44–1.00)
GFR, Estimated: >60 mL/min (ref >60)
Glucose, Bld: 87 mg/dL (ref 70–99)
Potassium: 3.7 mmol/L (ref 3.5–5.1)
Sodium: 137 mmol/L (ref 135–145)
Total Bilirubin: 0.5 mg/dL (ref 0.0–1.2)
Total Protein: 7.1 g/dL (ref 6.5–8.1)

## 2023-09-17 LAB — POCT URINALYSIS DIP (MANUAL ENTRY)
Bilirubin, UA: NEGATIVE
Glucose, UA: NEGATIVE mg/dL
Ketones, POC UA: NEGATIVE mg/dL
Leukocytes, UA: NEGATIVE
Nitrite, UA: NEGATIVE
Protein Ur, POC: NEGATIVE mg/dL
Spec Grav, UA: >=1.03 — AB (ref 1.010–1.025)
Urobilinogen, UA: 0.2 U/dL
pH, UA: 5.5 (ref 5.0–8.0)

## 2023-09-17 LAB — LIPASE, BLOOD: Lipase: 23 U/L (ref 11–51)

## 2023-09-17 MED ORDER — SODIUM CHLORIDE 0.9 % IV BOLUS
1000.0000 mL | Freq: Once | INTRAVENOUS | Status: AC
  Administered 2023-09-17: 1000 mL via INTRAVENOUS

## 2023-09-17 MED ORDER — ONDANSETRON 4 MG PO TBDP
4.0000 mg | ORAL_TABLET | Freq: Once | ORAL | Status: AC
  Administered 2023-09-17: 4 mg via ORAL

## 2023-09-17 MED ORDER — ONDANSETRON 4 MG PO TBDP: 4.0000 mg | ORAL_TABLET | Freq: Three times a day (TID) | ORAL | 0 refills | Status: DC | PRN

## 2023-09-17 MED ORDER — ONDANSETRON 4 MG PO TBDP
ORAL_TABLET | ORAL | Status: AC
  Filled 2023-09-17: qty 1

## 2023-09-17 NOTE — ED Provider Notes (Signed)
 [facility]   [id number] 09/17/23 Arrival Time: 1914  ASSESSMENT & PLAN:  1. Epigastric pain   2. Nausea and vomiting, unspecified vomiting type   3. Diarrhea, unspecified type   4. Dehydration     Meds ordered this encounter  Medications   ondansetron  (ZOFRAN -ODT) disintegrating tablet 4 mg   sodium chloride  0.9 % bolus 1,000 mL   ondansetron  (ZOFRAN -ODT) 4 MG disintegrating tablet    Sig: Take 1 tablet (4 mg total) by mouth every 8 (eight) hours as needed for nausea or vomiting.    Dispense:  15 tablet    Refill:  0   Feeling better after IVF.  U/A without signs of infection. UPT negative.  Labs Pending:  CBC WITH DIFFERENTIAL/PLATELET  COMPREHENSIVE METABOLIC PANEL  LIPASE, BLOOD   Comfortable with home observation pending labs above.    Discharge Instructions      You have been seen today for abdominal pain. Your evaluation was not suggestive of any emergent condition requiring medical intervention at this time. However, some abdominal problems make take more time to appear. Therefore, it is very important for you to pay attention to any new symptoms or worsening of your current condition.  Please return here or to the Emergency Department immediately should you begin to feel worse in any way or have any of the following symptoms: increasing or different abdominal pain, persistent vomiting, inability to drink fluids, fevers, or shaking chills.   You have had labs (blood tests) sent today. We will call you with any significant abnormalities or if there is need to begin or change treatment or pursue further follow up.  You may also review your test results online through MyChart. If you do not have a MyChart account, instructions to sign up should be on your discharge paperwork.  Please do your best to ensure adequate fluid intake in order to avoid dehydration. If you find that you are unable to tolerate drinking fluids regularly please proceed to the  Emergency Department for evaluation.  Meds ordered this encounter  Medications   ondansetron  (ZOFRAN -ODT) disintegrating tablet 4 mg   sodium chloride  0.9 % bolus 1,000 mL   ondansetron  (ZOFRAN -ODT) 4 MG disintegrating tablet    Sig: Take 1 tablet (4 mg total) by mouth every 8 (eight) hours as needed for nausea or vomiting.    Dispense:  15 tablet    Refill:  0        Follow-up Information     [place] [facility] at [facility].   Specialty: Emergency Medicine Why: If symptoms worsen in any way. Contact information: [address] [place]  [phone]               Work note provided.  Reviewed expectations re: course of current medical issues. Questions answered. Outlined signs and symptoms indicating need for more acute intervention. Patient verbalized understanding. After Visit Summary given.   SUBJECTIVE: History from: patient.  Denise Arias is a 43 y.o. female who presents with complaint of non-bilious, non-bloody n/v with non-bloody diarrhea. Onset yesterday. Abdominal discomfort: mild and cramping. Symptoms are unchanged since beginning. Aggravating factors: eating. Alleviating factors: none identified. Associated symptoms: fatigue/chills. She denies headache. Appetite: decreased. PO intake: decreased. Ambulatory without assistance. Urinary symptoms: denies. Sick contacts: none. Recent travel or camping: none. No tx PTA.  Patient's last menstrual period was 09/14/2023.  Past Surgical History:  Procedure Laterality Date   BREAST REDUCTION SURGERY Bilateral 11/21/2020   Procedure: MAMMARY  REDUCTION  (BREAST);  Surgeon: Barb Bonito, MD;  Location: [facility];  Service: Plastics;  Laterality: Bilateral;  2 hours   NO PAST SURGERIES     VAGINAL DELIVERY     X 4    OBJECTIVE:  Vitals:   09/17/23 1026  BP: 124/79  Pulse: 72  Resp: 18  Temp: 97.7 F (36.5 C)  TempSrc: Oral   SpO2: 95%    General appearance: alert; no distress Oropharynx: somewhat dry Lungs: clear to auscultation bilaterally; unlabored Heart: regular rate and rhythm Abdomen: soft; non-distended; mild epigastric abdominal tenderness; bowel sounds present; no masses or organomegaly; without guarding or rebound tenderness Back: no CVA tenderness Extremities: no edema; symmetrical with no gross deformities Skin: warm; dry Neurologic: normal gait Psychological: alert and cooperative; normal mood and affect  Labs: Results for orders placed or performed during the hospital encounter of 09/17/23  POC urinalysis dipstick   Collection Time: 09/17/23 11:05 AM  Result Value Ref Range   Color, UA yellow yellow   Clarity, UA clear clear   Glucose, UA negative negative mg/dL   Bilirubin, UA negative negative   Ketones, POC UA negative negative mg/dL   Spec Grav, UA >=1.610 (A) 1.010 - 1.025   Blood, UA moderate (A) negative   pH, UA 5.5 5.0 - 8.0   Protein Ur, POC negative negative mg/dL   Urobilinogen, UA 0.2 0.2 or 1.0 E.U./dL   Nitrite, UA Negative Negative   Leukocytes, UA Negative Negative  POCT urine pregnancy   Collection Time: 09/17/23 11:05 AM  Result Value Ref Range   Preg Test, Ur Negative Negative   Labs Reviewed  POCT URINALYSIS DIP (MANUAL ENTRY) - Abnormal; Notable for the following components:      Result Value   Spec Grav, UA >=1.030 (*)    Blood, UA moderate (*)    All other components within normal limits  CBC WITH DIFFERENTIAL/PLATELET  COMPREHENSIVE METABOLIC PANEL  LIPASE, BLOOD  POCT URINE PREGNANCY    Imaging: No results found.  Allergies  Allergen Reactions   Molds & Smuts Anaphylaxis    Patient "unknown reaction but test stated I am allergic" Patient "unknown reaction but test stated I am allergic"    Amoxicillin  Nausea And Vomiting   Banana Hives   Fish Allergy  Other (See Comments)   Fish Oil Rash   Minocycline Rash and Hives    Rash per patient  report. Rash per patient report.   Multivitamins Other (See Comments)    sd her stomach/intestinal area swelled up after 2 hrs. sd her stomach/intestinal area swelled up after 2 hrs.   Strawberry Extract Rash and Other (See Comments)   Tizanidine  Other (See Comments)    Causes eyes to turn red. Causes eyes to turn red.   Tramadol  Other (See Comments)    "Heart works fast" Eye become red "Heart works fast" Eye become red   Vitamin C Rash   Banana Extract Allergy  Skin Test Other (See Comments)   Other Itching    "nuts with itching in ears, nose and throat" per patient   Fish Oil Rash                                               Past Medical History:  Diagnosis Date   Allergy     RHINITIS   GERD (gastroesophageal reflux disease)  Neck pain    Palpitations    Social History   Socioeconomic History   Marital status: Married    Spouse name: Not on file   Number of children: 5   Years of education: some colleg   Highest education level: Some college, no degree  Occupational History   Occupation: Homemaker  Tobacco Use   Smoking status: Never   Smokeless tobacco: Never  Vaping Use   Vaping status: Never Used  Substance and Sexual Activity   Alcohol use: No   Drug use: No   Sexual activity: Yes    Comment: last week  Other Topics Concern   Not on file  Social History Narrative   Lives at home with her family.   Right-handed.   Caffeine use: one cup caffeine per day.   Social Drivers of Corporate investment banker Strain: Low Risk  (06/25/2023)   Overall Financial Resource Strain (CARDIA)    Difficulty of Paying Living Expenses: Not hard at all  Food Insecurity: Food Insecurity Present (06/25/2023)   Hunger Vital Sign    Worried About Running Out of Food in the Last Year: Never true    Ran Out of Food in the Last Year: Sometimes true  Transportation Needs: No Transportation Needs (06/25/2023)   PRAPARE - Administrator, Civil Service (Medical): No     Lack of Transportation (Non-Medical): No  Physical Activity: Inactive (06/25/2023)   Exercise Vital Sign    Days of Exercise per Week: 0 days    Minutes of Exercise per Session: 0 min  Stress: No Stress Concern Present (06/25/2023)   [employer] of Occupational Health - Occupational Stress Questionnaire    Feeling of Stress : Not at all  Social Connections: Moderately Isolated (06/25/2023)   Social Connection and Isolation Panel [NHANES]    Frequency of Communication with Friends and Family: More than three times a week    Frequency of Social Gatherings with Friends and Family: More than three times a week    Attends Religious Services: Never    Database administrator or Organizations: No    Attends Banker Meetings: Never    Marital Status: Married  Catering manager Violence: Not At Risk (06/25/2023)   Humiliation, Afraid, Rape, and Kick questionnaire    Fear of Current or Ex-Partner: No    Emotionally Abused: No    Physically Abused: No    Sexually Abused: No   Family History  Problem Relation Age of Onset   Diabetes Mother    Hypertension Mother    Other Father        unsure of medical history   Thyroid  disease Sister        goiter   Diabetes Sister    Other Brother        murdered   Asthma Maternal Daymon Evans, MD 09/17/23 [phone]

## 2023-09-17 NOTE — ED Triage Notes (Addendum)
 Pt having body aches, chills, and headaches that were last week. Yesterday started having n/v/d. Pt c/o intermittent abd pains. Pt started her menstrual cycle on 09/14/2023. Took tylenol  and ibuprofen  last night.

## 2023-09-17 NOTE — ED Notes (Signed)
 Unable to urinate at this time. Giving patient water.

## 2023-09-17 NOTE — Discharge Instructions (Addendum)
 You have been seen today for abdominal pain. Your evaluation was not suggestive of any emergent condition requiring medical intervention at this time. However, some abdominal problems make take more time to appear. Therefore, it is very important for you to pay attention to any new symptoms or worsening of your current condition.  Please return here or to the Emergency Department immediately should you begin to feel worse in any way or have any of the following symptoms: increasing or different abdominal pain, persistent vomiting, inability to drink fluids, fevers, or shaking chills.   You have had labs (blood tests) sent today. We will call you with any significant abnormalities or if there is need to begin or change treatment or pursue further follow up.  You may also review your test results online through MyChart. If you do not have a MyChart account, instructions to sign up should be on your discharge paperwork.  Please do your best to ensure adequate fluid intake in order to avoid dehydration. If you find that you are unable to tolerate drinking fluids regularly please proceed to the Emergency Department for evaluation.  Meds ordered this encounter  Medications   ondansetron  (ZOFRAN -ODT) disintegrating tablet 4 mg   sodium chloride  0.9 % bolus 1,000 mL   ondansetron  (ZOFRAN -ODT) 4 MG disintegrating tablet    Sig: Take 1 tablet (4 mg total) by mouth every 8 (eight) hours as needed for nausea or vomiting.    Dispense:  15 tablet    Refill:  0

## 2023-09-26 ENCOUNTER — Ambulatory Visit: Payer: BC Managed Care – PPO | Admitting: Nurse Practitioner

## 2023-10-03 ENCOUNTER — Ambulatory Visit: Payer: BC Managed Care – PPO | Attending: Internal Medicine | Admitting: Internal Medicine

## 2023-10-03 VITALS — BP 121/71 | HR 63 | Temp 98.8°F | Ht 65.0 in | Wt 209.0 lb

## 2023-10-03 DIAGNOSIS — Z6834 Body mass index (BMI) 34.0-34.9, adult: Secondary | ICD-10-CM

## 2023-10-03 DIAGNOSIS — R1033 Periumbilical pain: Secondary | ICD-10-CM

## 2023-10-03 DIAGNOSIS — J3089 Other allergic rhinitis: Secondary | ICD-10-CM

## 2023-10-03 DIAGNOSIS — E669 Obesity, unspecified: Secondary | ICD-10-CM | POA: Diagnosis not present

## 2023-10-03 DIAGNOSIS — N393 Stress incontinence (female) (male): Secondary | ICD-10-CM

## 2023-10-03 DIAGNOSIS — E66811 Obesity, class 1: Secondary | ICD-10-CM

## 2023-10-03 MED ORDER — AZELASTINE HCL 0.1 % NA SOLN: 1.0000 | Freq: Two times a day (BID) | NASAL | 1 refills | Status: DC
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
## 2023-10-03 NOTE — Patient Instructions (Signed)
Urinary Incontinence in Females: How to Manage Urinary incontinence, or UI, happens if you can't always control when you pee. If you think you have UI, it's important to talk to your health care provider. How does UI affect me? UI can make it hard to enjoy daily activities. It can affect your social life. It can also affect your mental health. What actions can I take to manage UI? Talk to your provider. There're things you can do and treatments that can help. Change your lifestyle  Quit smoking. Do not smoke, vape, or use nicotine or tobacco. Lose weight or keep a healthy weight. Do pelvic floor muscle exercises as told. You may hear these called Kegel exercises. Stay active. Eat a healthy diet. Change your behavior Try bladder training. This may include using the bathroom at set times during the day. Use the bathroom every 3-4 hours, even if you don't feel the need to pee. Try to empty your bladder of all pee every time you go. After peeing, wait a minute. Then try to pee again. Find ways to reduce bladder urges. This can include distraction techniques or controlled breathing exercises. Make sure you're in a relaxed position while peeing. If needed, wear pads to absorb pee that leaks. Get treatment Treatment for UI depends on the type of incontinence that you have and its cause. This may include: Medicines to relax the bladder muscles. Treatments, such as: Pulses of electricity to help change bladder reflexes (electrical nerve stimulation). A shot of collagen or carbon beads into the bladder opening. This can help thicken tissue and close the bladder opening. Botox injections to relax the bladder muscles. Surgery. Products such as: A pessary. This is a device to prevent pee leaks. It's placed in your vagina to help support your pelvic floor muscles. A catheter. This is a soft tube that's put into your urethra to drain pee from the bladder. The catheter may be connected to a bag that  collects pee. Portable commodes, bedpans, or urinals.  Follow these instructions at home: Eating and drinking Change your diet as told. You may be asked to: Drink fluids in small amounts throughout the day instead of large amounts at one time. Use less caffeine or alcohol. Eat foods that are high in fiber, such as beans, whole grains, or fresh fruits and vegetables. General instructions Take your medicines only as told. Keep all follow-up visits. Your provider may need to change your treatment plan if needed. Where to find more information The Office on Women's Health: TravelLesson.ca [employer] (ACOG): acog.org Contact a health care provide if: Your symptoms don't get better after treatment. Your symptoms get worse. You have new symptoms. This information is not intended to replace advice given to you by your health care provider. Make sure you discuss any questions you have with your health care provider. Document Revised: 07/23/2023 Document Reviewed: 05/22/2023 Elsevier Patient Education  2024 [employer].

## 2023-10-05 ENCOUNTER — Encounter: Payer: Self-pay | Admitting: Internal Medicine

## 2023-10-27 ENCOUNTER — Other Ambulatory Visit: Payer: Self-pay | Admitting: Internal Medicine

## 2023-10-27 DIAGNOSIS — J3089 Other allergic rhinitis: Secondary | ICD-10-CM

## 2023-10-28 NOTE — Telephone Encounter (Signed)
 Requested medication (s) are due for refill today: na  Requested medication (s) are on the active medication list: yes  Last refill:  10/03/23 #30 ml 1 refills  Future visit scheduled: yes in 3 months  Notes to clinic:  Pharmacy comment: REQUEST FOR 90 DAYS PRESCRIPTION. DX Code Needed.      Requested Prescriptions  Pending Prescriptions Disp Refills   Azelastine HCl 137 MCG/SPRAY SOLN [Pharmacy Med Name: AZELASTINE 0.1% (137 MCG) SPRY]  1    Sig: PLACE 1 SPRAY INTO BOTH NOSTRILS 2 (TWO) TIMES DAILY. USE IN EACH NOSTRIL AS DIRECTED     Ear, Nose, and Throat: Nasal Preparations - Antiallergy Passed - 10/28/2023  2:05 PM      Passed - Valid encounter within last 12 months    Recent Outpatient Visits           3 weeks ago Other allergic rhinitis   [facility] - [facility] Dept Of [place]. [facility] Marcine Matar, MD   4 months ago Dyspareunia, female   [facility] - A Dept Of [place]. [facility] Claiborne Rigg, NP   5 months ago Prediabetes   [facility] - A Dept Of [place]. [facility], Marylene Land M, [place]   9 months ago Left foot pain   [facility] - A Dept Of [place]. [facility], Marzella Schlein, [place]   1 year ago Sebaceous cyst of breast, right   [facility] - [facility] Of [place]. [facility] Marcine Matar, MD       Future Appointments             In 3 months Laural Benes Binnie Rail, MD [facility] - A Dept Of Eligha Bridegroom. [facility]

## 2023-10-30 ENCOUNTER — Telehealth: Payer: Self-pay

## 2023-10-30 NOTE — Telephone Encounter (Signed)
 Let pt know that her insurance will not pay for [facility] without diagnosis of diabetes. Give appt with me to discuss options.

## 2023-10-30 NOTE — Telephone Encounter (Signed)
 Copied from CRM [id number]. Topic: General - Other >> Oct 29, 2023 11:15 AM Eunice Blase wrote: Reason for CRM: Pt called and per discussion with doctor wants to start Mojourno for weight loss. Please call pt at [phone]

## 2023-11-04 ENCOUNTER — Encounter: Payer: BC Managed Care – PPO | Attending: Internal Medicine | Admitting: Dietician

## 2023-11-04 ENCOUNTER — Encounter: Payer: Self-pay | Admitting: Dietician

## 2023-11-04 VITALS — Wt 203.0 lb

## 2023-11-04 DIAGNOSIS — E66811 Obesity, class 1: Secondary | ICD-10-CM | POA: Diagnosis present

## 2023-11-04 NOTE — Progress Notes (Unsigned)
 Medical Nutrition Therapy  Appointment Start time:  [phone]  Appointment End time:  1730  Primary concerns today: weight loss   Referral diagnosis: E66.811 Preferred learning style: no preference indicated Learning readiness: ready   NUTRITION ASSESSMENT   Anthropometrics   Wt 11/04/23: 203 lb  Clinical Medical Hx: GERD, allergies, prediabetes Medications: reviewed Labs: 05/07/23: A1c 6.4% Notable Signs/Symptoms: none reported Food Allergies: salmon causes hives, strawberries itch mouth  Lifestyle & Dietary Hx  Pt is fasting with   Was 209 lb a month ao  Pt states never in her life was she this weight, wasntr eat out, cooking from home.   Noticed weight gain around 2 years ago. 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 6am-3pm, cooks for her kids. 5 kids and husband.      Estimated daily fluid intake: 64 oz Supplements: MVI, not currently taking Sleep: 9pm-5am, 8 hours Stress / self-care: high stress, walking helps with stress Current average weekly physical activity: ADLs  24-Hr Dietary Recall First Meal: 5:30am milk with tea with 4 sugar Snack: 8am: coffee during break or tea Second Meal: 11:30am: rice with chicken OR bread with chicken Snack: none OR fruit Third Meal: 6pm: chicken and macaroni or rice Snack: fruit OR none Beverages: water, tea with 4 sugar, coffee   NUTRITION DIAGNOSIS  NB-1.1 Food and nutrition-related knowledge deficit As related to lack of prior education by a registered dietitian.  As evidenced by pt report.   NUTRITION INTERVENTION  Nutrition education (E-1) on the following topics:   Plate Method Fruits & Vegetables: Aim to fill half your plate with a variety of fruits and vegetables. They are rich in vitamins, minerals, and fiber, and can help reduce the risk of chronic diseases. Choose a colorful assortment of fruits and vegetables to ensure you get a wide range of nutrients. Grains and Starches:  Make at least half of your grain choices whole grains, such as brown rice, whole wheat bread, and oats. Whole grains provide fiber, which aids in digestion and healthy cholesterol levels. Aim for whole forms of starchy vegetables such as potatoes, sweet potatoes, beans, peas, and corn, which are fiber rich and provide many vitamins and minerals.  Protein: Incorporate lean sources of protein, such as poultry, fish, beans, nuts, and seeds, into your meals. Protein is essential for building and repairing tissues, staying full, balancing blood sugar, as well as supporting immune function. Dairy: Include low-fat or fat-free dairy products like milk, yogurt, and cheese in your diet. Dairy foods are excellent sources of calcium and vitamin D, which are crucial for bone health.   Physical Activity Aim for 150 minutes of physical activity weekly. Make physical activity a part of your week. Try to include at least 30 minutes of physical activity 5 days each week or at least 150 minutes per week. Regular physical activity promotes overall health-including helping to reduce risk for heart disease and diabetes, promoting mental health, and helping [place] sleep better.     Prediabetes Prediabetes: Prediabetes is a condition where blood sugar levels are higher than normal but not yet high enough to be diagnosed as type 2 diabetes. A1C, or hemoglobin A1c, is a blood test that provides an average of a person's blood sugar levels over the past two to three months. It is commonly used to diagnose and monitor diabetes. For prediabetes, an A1C level between 5.7% and 6.4% typically is used to diagnose this. Here is how the  A1C levels are generally categorized: Normal:  A1C below 5.7% Prediabetes:  A1C between 5.7% and 6.4% Diabetes:  A1C of 6.5% or higher When diagnosed with prediabetes, there are several lifestyle changes you can make to manage the condition: Healthy Eating:  Follow a well-balanced diet that includes a  variety of fruits, vegetables, whole grains, lean proteins, and healthy fats. Monitor portion sizes and reduce intake of sugary and processed foods. Regular Physical Activity:  Engage in regular physical activity, such as brisk walking, cycling, or other aerobic exercises, for at least 150 minutes per week. Include strength training exercises at least twice a week. Weight Management: Achieve and maintain a healthy weight. Losing even a small amount of weight (3-5%) can significantly improve insulin sensitivity.   Handouts Provided Include  Plate Method  Learning Style & Readiness for Change Teaching method utilized: Visual & Auditory  Demonstrated degree of understanding via: Teach Back  Barriers to learning/adherence to lifestyle change: none  Goals Established by Pt  Goal: go walking 2 times per week for 30 minutes.   Goal: incorporate 1/2 meal of non-starchy vegetables during lunch.  Goal: reduce sugar in tea from 4 packet to 2 packets.    MONITORING & EVALUATION Dietary intake, weekly physical activity, and follow up in 6 weeks.  Next Steps  Patient is to call for questions.

## 2023-11-04 NOTE — Patient Instructions (Signed)
 Goals Established by Pt  Goal: go walking 2 times per week for 30 minutes.   Goal: incorporate 1/2 meal of non-starchy vegetables during lunch.  Goal: reduce sugar in tea from 4 packet to 2 packets.

## 2023-11-04 NOTE — Telephone Encounter (Signed)
 Give next available in-person

## 2023-11-05 NOTE — Telephone Encounter (Signed)
 Called & spoke to the patient. Verified name & DOB. Informed that insurance will not covere Mounjaro without a DM diagnosis. Appointment scheduled for 12/11/2023. Patient confirmed appointment.

## 2023-11-10 ENCOUNTER — Other Ambulatory Visit: Payer: Self-pay | Admitting: Physician Assistant

## 2023-11-10 DIAGNOSIS — J3089 Other allergic rhinitis: Secondary | ICD-10-CM

## 2023-11-11 ENCOUNTER — Other Ambulatory Visit: Payer: Self-pay

## 2023-11-11 ENCOUNTER — Encounter: Payer: Self-pay | Admitting: Allergy & Immunology

## 2023-11-11 ENCOUNTER — Ambulatory Visit: Payer: Medicaid Other | Admitting: Allergy & Immunology

## 2023-11-11 VITALS — BP 130/86 | HR 68 | Temp 98.2°F | Resp 18 | Ht 63.75 in | Wt 204.3 lb

## 2023-11-11 DIAGNOSIS — L299 Pruritus, unspecified: Secondary | ICD-10-CM | POA: Diagnosis not present

## 2023-11-11 DIAGNOSIS — J310 Chronic rhinitis: Secondary | ICD-10-CM

## 2023-11-11 MED ORDER — TRIAMCINOLONE ACETONIDE 0.1 % EX OINT: 1.0000 | TOPICAL_OINTMENT | Freq: Two times a day (BID) | CUTANEOUS | 0 refills | Status: AC | PRN

## 2023-11-11 NOTE — Progress Notes (Signed)
 NEW PATIENT  Date of Service/Encounter:  11/11/23  Consult requested by: Marcine Matar, MD   Assessment:   Itching  Chronic rhinitis - planning for skin testing at the next appointment  Plan/Recommendations:   1. Itching - Hopefully the testing will be revealing about what is causing these symptoms. - We may have to get labs if the skin testing is not revealing. - We will test for the environmental allergens and the most common foods, which will rule out more than 95% of all food allergens.  - Start triamcinolone 0.1% ointment twice daily as needed (safe to use anywhere below the neck).   2. Chronic rhinitis - Because of insurance stipulations, we cannot do skin testing on the same day as your first visit. - We are all working to fight this, but for now we need to do two separate visits.  - We will know more after we do testing at the next visit.  - The skin testing visit can be squeezed in at your convenience.  - Then we can make a more full plan to address all of your symptoms. - Be sure to stop your antihistamines for 3 days before this appointment.   3. Return in about 1 week (around 11/18/2023). You can have the follow up appointment with Dr. Dellis Anes or a Nurse Practicioner (our Nurse Practitioners are excellent and always have Physician oversight!).    This note in its entirety was forwarded to the Provider who requested this consultation.  Subjective:   Denise Arias is a 43 y.o. female presenting today for evaluation of  Chief Complaint  Patient presents with   Establish Care    About 2 years ago, started having allergy symptoms. Feels that Covid shot brought about this issue. Symptoms- Headache, itchiness around sinus, roof of her mouth, and ears. Occasional sneezing. Would like to know allergies---only happens at home, when she is outside, she is fine.     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  Discussed the use of AI scribe software for clinical note transcription with the patient and/or guardian, who gave verbal consent to proceed.  Weslyn Holsonback was referred by Marcine Matar, MD.     Denise Arias is a 43 y.o. female presenting for an evaluation of hives and rhinitis  .  She has been experiencing chronic itching in her ear, nose, and chin for almost two years. The itching is described as internal, with no visible rash or swelling. She sometimes experiences sneezing but has no runny nose, postnasal drip, or asthma symptoms. She takes cetirizine daily to manage the symptoms, which helps but causes drowsiness,  and she prefers not to rely on daily medication. The symptoms do not worsen with food intake and do not wake her up at night unless she misses her medication. The itching is localized to her face and does not extend to other parts of her body. There is no seasonal variation in her symptoms.  She recalls a severe allergic reaction in 2004, characterized by hives and skin changes, leading to significant skin sensitivity and discoloration.  She reports losing a significant portion of her skin color and describes her skin as very sensitive, with a history of redness and scarring.  She underwent allergy testing approximately three years ago, which did not yield significant findings. She associates the onset of her symptoms with receiving the COVID-19 vaccine, noting a change in her condition since then. She denies any current food allergies or reactions to specific foods.  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 She suspects that her home environment might be contributing to her symptoms, as she feels better when outside her home. She has undertaken renovations, including changing carpets and renovating the bathroom, but the symptoms persist.  Family history reveals that one of her daughters experiences allergies when the weather turns cold.     2Otherwise, there is no history of other atopic diseases, including food allergies, drug allergies, stinging insect allergies, eczema, urticaria, or contact dermatitis. There is no significant infectious history. Vaccinations are up to date.    Past Medical History: Patient Active Problem List   Diagnosis Date Noted   Paresthesia 11/09/2020   Sensation of fullness in ear 10/31/2020   Seasonal and perennial allergic rhinoconjunctivitis 10/31/2020   Anemia, chronic disease 08/16/2020   Influenza vaccination declined 07/10/2020   Abdominal obesity 07/10/2020   Obesity (BMI 30.0-34.9) 07/10/2020   Adverse food reaction 05/05/2020   Other allergic rhinitis 05/05/2020   Prediabetes 04/11/2020   Reactive depression 02/21/2020   Class 2 obesity due to excess calories without serious comorbidity with body mass index (BMI) of 35.0 to 35.9 in adult 02/21/2020   Drug reaction 03/03/2017   Frequent headaches 11/15/2016   Palpitations 11/15/2016   Family history of  thyroid disease in sister 11/15/2016   Post-term pregnancy 08/09/2014   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 Rash per patient report.   Multivitamins Other (See Comments)   sd her stomach/intestinal area swelled up after 2 hrs. sd her stomach/intestinal area swelled up after 2 hrs.   Strawberry Extract Rash, Other (See Comments)   Tizanidine Other (See Comments)   Causes eyes to turn red. Causes eyes to turn red.   Tramadol Other (See Comments)   "Heart works fast" Eye become red "Heart works fast" Eye become red   Vitamin C Rash   Banana Extract Allergy Skin Test Other (See Comments)   Other Itching   "nuts with itching in ears, nose and throat" per patient   Fish Oil  Rash        Medication List        Accurate as of November 11, 2023 11:59 PM. If you have any questions, ask your nurse or doctor.          Azelastine HCl 137 MCG/SPRAY Soln PLACE 1 SPRAY INTO BOTH NOSTRILS 2 (TWO) TIMES DAILY. USE IN EACH NOSTRIL AS DIRECTED What changed: See the new instructions.   cetirizine 10 MG tablet Commonly known as: ZYRTEC Take 10 mg by mouth at bedtime.   meloxicam 7.5 MG tablet Commonly known as: MOBIC Take 1 tablet (7.5 mg total) by mouth daily. Prn pain   methocarbamol 500 MG tablet Commonly known as: ROBAXIN 1 to 2 tablets every 8 hours as needed for muscle spasm   ondansetron 4 MG disintegrating tablet Commonly known as: ZOFRAN-ODT Take 1 tablet (4 mg total) by mouth every 8 (eight) hours as needed for nausea or vomiting.   triamcinolone ointment 0.1  % Commonly known as: KENALOG Apply 1 Application topically 2 (two) times daily as needed. Started by: Alfonse Spruce        Birth History: non-contributory  Developmental History: non-contributory  Past Surgical History: Past Surgical History:  Procedure Laterality Date   BREAST REDUCTION SURGERY Bilateral 11/21/2020   Procedure: MAMMARY REDUCTION  (BREAST);  Surgeon: [name], MD;  Location: [facility];  Service: Plastics;  Laterality: Bilateral;  2 hours   NO PAST SURGERIES     VAGINAL DELIVERY     X 4     Family History: Family History  Problem Relation Age of Onset   Diabetes Mother    Hypertension Mother    Other Father        unsure of medical history   Thyroid disease Sister        goiter   Diabetes Sister    Other Brother        murdered   Asthma Maternal Uncle    Asthma Daughter      Social History: Azalyn lives at home with her family.  They live in a house that is 45 years old.  There is wood throughout the home.  They have gas heating and central cooling.  There are no animals inside or outside of the home.  There are no dust mite covers on the bedding.  There is tobacco exposure in the house.  She currently works with [employer].  She has done this for 18 months.  She is exposed to fumes, chemicals, and dust.  She does not have any HEPA filters in her home.  She does not live near an interstate or industrial area.   Review of systems otherwise negative other than that mentioned in the HPI.    Objective:   Blood pressure 130/86, pulse 68, temperature 98.2 F (36.8 C), temperature source Temporal, resp. rate 18, height 5' 3.75" (1.619 m), weight 204 lb 4.8 oz (92.7 kg), SpO2 99%. Body mass index is 35.34 kg/m.     Physical Exam Vitals reviewed.  Constitutional:      Appearance: She is well-developed.  HENT:     Head: Normocephalic and atraumatic.     Right Ear: Tympanic membrane, ear canal and external  ear normal. No drainage, swelling or tenderness. Tympanic membrane is not injected, scarred, erythematous, retracted or bulging.     Left Ear: Tympanic membrane, ear canal and external ear normal. No drainage, swelling or tenderness. Tympanic membrane is not injected, scarred, erythematous, retracted  or bulging.     Nose: No nasal deformity, septal deviation, mucosal edema or rhinorrhea.     Right Turbinates: Enlarged, swollen and pale.     Left Turbinates: Enlarged, swollen and pale.     Right Sinus: No maxillary sinus tenderness or frontal sinus tenderness.     Left Sinus: No maxillary sinus tenderness or frontal sinus tenderness.     Mouth/Throat:     Mouth: Mucous membranes are not pale and not dry.     Pharynx: Uvula midline.  Eyes:     General:        Right eye: No discharge.        Left eye: No discharge.     Conjunctiva/sclera: Conjunctivae normal.     Right eye: Right conjunctiva is not injected. No chemosis.    Left eye: Left conjunctiva is not injected. No chemosis.    Pupils: Pupils are equal, round, and reactive to light.  Cardiovascular:     Rate and Rhythm: Normal rate and regular rhythm.     Heart sounds: Normal heart sounds.  Pulmonary:     Effort: Pulmonary effort is normal. No tachypnea, accessory muscle usage or respiratory distress.     Breath sounds: Normal breath sounds. No wheezing, rhonchi or rales.  Chest:     Chest wall: No tenderness.  Abdominal:     Tenderness: There is no abdominal tenderness. There is no guarding or rebound.  Lymphadenopathy:     Head:     Right side of head: No submandibular, tonsillar or occipital adenopathy.     Left side of head: No submandibular, tonsillar or occipital adenopathy.     Cervical: No cervical adenopathy.  Skin:    General: Skin is warm.     Capillary Refill: Capillary refill takes less than 2 seconds.     Coloration: Skin is not pale.     Findings: No abrasion, erythema, petechiae or rash. Rash is not papular,  urticarial or vesicular.     Comments: Excoriations noted over bilateral arms.  Neurological:     Mental Status: She is alert.  Psychiatric:        Behavior: Behavior is cooperative.      Diagnostic studies: deferred due to insurance stipulations that require a separate visit for testing          Malachi Bonds, MD [facility]

## 2023-11-11 NOTE — Patient Instructions (Addendum)
 1. Itching - Hopefully the testing will be revealing about what is causing these symptoms. - We may have to get labs if the skin testing is not revealing. - We will test for the environmental allergens and the most common foods, which will rule out more than 95% of all food allergens.  - Start triamcinolone 0.1% ointment twice daily as needed (safe to use anywhere below the neck).   2. Chronic rhinitis - Because of insurance stipulations, we cannot do skin testing on the same day as your first visit. - We are all working to fight this, but for now we need to do two separate visits.  - We will know more after we do testing at the next visit.  - The skin testing visit can be squeezed in at your convenience.  - Then we can make a more full plan to address all of your symptoms. - Be sure to stop your antihistamines for 3 days before this appointment.   3. Return in about 1 week (around 11/18/2023). You can have the follow up appointment with Dr. Dellis Anes or a Nurse Practicioner (our Nurse Practitioners are excellent and always have Physician oversight!).    Please inform us of any Emergency Department visits, hospitalizations, or changes in symptoms. Call us before going to the ED for breathing or allergy symptoms since we might be able to fit you in for a sick visit. Feel free to contact us anytime with any questions, problems, or concerns.  It was a pleasure to meet you today!  Websites that have reliable patient information: 1. American Academy of Asthma, Allergy, and Immunology: www.aaaai.org 2. Food Allergy Research and Education (FARE): foodallergy.org 3. Mothers of Asthmatics: http://www.asthmacommunitynetwork.org 4. American College of Allergy, Asthma, and Immunology: www.acaai.org      "Like" [place] on Facebook and Instagram for our latest updates!      A healthy democracy works best when [employer] participate! Make sure you are registered to vote! If you have moved or changed  any of your contact information, you will need to get this updated before voting! Scan the QR codes below to learn more!

## 2023-11-18 ENCOUNTER — Encounter: Payer: Self-pay | Admitting: Plastic Surgery

## 2023-11-18 ENCOUNTER — Ambulatory Visit: Payer: BC Managed Care – PPO | Admitting: Plastic Surgery

## 2023-11-18 ENCOUNTER — Encounter (INDEPENDENT_AMBULATORY_CARE_PROVIDER_SITE_OTHER): Payer: Self-pay

## 2023-11-18 VITALS — BP 114/69 | HR 66 | Ht 64.0 in | Wt 204.3 lb

## 2023-11-18 DIAGNOSIS — E65 Localized adiposity: Secondary | ICD-10-CM | POA: Diagnosis not present

## 2023-11-18 DIAGNOSIS — E66811 Obesity, class 1: Secondary | ICD-10-CM | POA: Diagnosis not present

## 2023-11-18 DIAGNOSIS — R7303 Prediabetes: Secondary | ICD-10-CM | POA: Diagnosis not present

## 2023-11-18 DIAGNOSIS — M793 Panniculitis, unspecified: Secondary | ICD-10-CM | POA: Diagnosis not present

## 2023-11-18 NOTE — Progress Notes (Signed)
 Patient ID: Denise Arias, female    DOB: 1981/06/05, 43 y.o.   MRN: [id number]   Chief Complaint  Patient presents with   consult   Skin Problem    The patient is a 43 year old female here for evaluation of her abdomen.  She is interested in a panniculectomy.  She complains of panniculitis in the skin folds.  She has tried to lose weight and it has been difficult.  She is 5 feet 4 inches tall and weighs 204 pounds.  She has not been to the healthy weight and wellness center yet.  She does have an appointment with a nutritionist.  She is wanting to lose some weight and to be healthier.  She has 5 kids.  She has a history of eczema and reflux.  Her past medical history is positive for breast reduction.    Review of Systems  Constitutional: Negative.   Eyes: Negative.   Respiratory: Negative.  Negative for chest tightness.   Cardiovascular: Negative.   Gastrointestinal: Negative.   Endocrine: Negative.   Genitourinary: Negative.   Musculoskeletal:  Positive for back pain.  All other systems reviewed and are negative.   Past Medical History:  Diagnosis Date   Allergy    RHINITIS   Eczema    GERD (gastroesophageal reflux disease)    Neck pain    Palpitations    Urticaria     Past Surgical History:  Procedure Laterality Date   BREAST REDUCTION SURGERY Bilateral 11/21/2020   Procedure: MAMMARY REDUCTION  (BREAST);  Surgeon: [name], MD;  Location: [facility];  Service: Plastics;  Laterality: Bilateral;  2 hours   NO PAST SURGERIES     VAGINAL DELIVERY     X 4      Current Outpatient Medications:    triamcinolone ointment (KENALOG) 0.1 %, Apply 1 Application topically 2 (two) times daily as needed., Disp: 454 g, Rfl: 0   Azelastine HCl 137 MCG/SPRAY SOLN, PLACE 1 SPRAY INTO BOTH NOSTRILS 2 (TWO) TIMES DAILY. USE IN EACH NOSTRIL AS DIRECTED (Patient not taking: Reported on 11/18/2023), Disp: 90 mL, Rfl: 3   cetirizine (ZYRTEC) 10 MG tablet, TAKE 1  TABLET BY MOUTH EVERYDAY AT BEDTIME (Patient not taking: Reported on 11/18/2023), Disp: 30 tablet, Rfl: 1   meloxicam (MOBIC) 7.5 MG tablet, Take 1 tablet (7.5 mg total) by mouth daily. Prn pain (Patient not taking: Reported on 06/25/2023), Disp: 30 tablet, Rfl: 1   methocarbamol (ROBAXIN) 500 MG tablet, 1 to 2 tablets every 8 hours as needed for muscle spasm (Patient not taking: Reported on 06/25/2023), Disp: 90 tablet, Rfl: 1   ondansetron (ZOFRAN-ODT) 4 MG disintegrating tablet, Take 1 tablet (4 mg total) by mouth every 8 (eight) hours as needed for nausea or vomiting. (Patient not taking: Reported on 11/11/2023), Disp: 15 tablet, Rfl: 0   Objective:   Vitals:   11/18/23 1424  BP: 114/69  Pulse: 66  SpO2: 96%    Physical Exam HENT:     Head: Atraumatic.  Cardiovascular:     Rate and Rhythm: Normal rate.     Pulses: Normal pulses.  Pulmonary:     Effort: Pulmonary effort is normal.  Abdominal:     General: There is no distension.     Palpations: Abdomen is soft. There is no mass.     Tenderness: There is no abdominal tenderness.  Musculoskeletal:        General: No swelling or deformity.  Skin:  General: Skin is warm.     Capillary Refill: Capillary refill takes less than 2 seconds.     Coloration: Skin is not jaundiced.     Findings: No bruising or lesion.  Neurological:     Mental Status: She is oriented to person, place, and time.  Psychiatric:        Mood and Affect: Mood normal.        Behavior: Behavior normal.        Thought Content: Thought content normal.        Judgment: Judgment normal.     Assessment & Plan:  Prediabetes - Plan: Amb Ref to Medical Weight Management  Abdominal obesity - Plan: Amb Ref to Medical Weight Management  Obesity (BMI 30.0-34.9) - Plan: Amb Ref to Medical Weight Management  Panniculitis  The patient has some pannus hanging over but I think that if she can get the bulk of her abdomen corrected with a decrease in weight it would  help with getting a better result for surgery.  She is willing to go to healthy weight and wellness.  I would recommend she see [place] back in about 6 months for reevaluation.  Pictures were obtained of the patient and placed in the chart with the patient's or guardian's permission.   Alena Bills Dandrae Kustra, DO

## 2023-11-20 ENCOUNTER — Ambulatory Visit (INDEPENDENT_AMBULATORY_CARE_PROVIDER_SITE_OTHER): Admitting: Allergy & Immunology

## 2023-11-20 DIAGNOSIS — L299 Pruritus, unspecified: Secondary | ICD-10-CM

## 2023-11-20 DIAGNOSIS — J3089 Other allergic rhinitis: Secondary | ICD-10-CM | POA: Diagnosis not present

## 2023-11-20 MED ORDER — LEVOCETIRIZINE DIHYDROCHLORIDE 5 MG PO TABS: 5.0000 mg | ORAL_TABLET | Freq: Every evening | ORAL | 1 refills | Status: AC

## 2023-11-20 NOTE — Progress Notes (Unsigned)
 FOLLOW UP  Date of Service/Encounter:  11/20/23   Assessment:   Itching  Perennial allergic rhinitis - Plan: Allergy Test, Intradermal Allergy Test  Plan/Recommendations:   Patient Instructions  1. Itching - Dust mites was the major allergen detected on testing today. - Try the avoidance measures to see if this helps. - Testing to the most common foods was completely negative, ruling out more than 95% of all food allergies.  - We will hold off on labs until the next visit. - Hopefully some avoidance measures and medications will help.  - Continue with triamcinolone 0.1% ointment twice daily as needed (safe to use anywhere below the neck).   2. Chronic rhinitis - Testing today showed: dust mites - Copy of test results provided.  - Avoidance measures provided. - Start taking: Xyzal (levocetirizine) 5mg  tablet TWICE daily - You can use an extra dose of the antihistamine, if needed, for breakthrough symptoms.  - Consider nasal saline rinses 1-2 times daily to remove allergens from the nasal cavities as well as help with mucous clearance (this is especially helpful to do before the nasal sprays are given) - Consider allergy shots as a means of long-term control. - Allergy shots "re-train" and "reset" the immune system to ignore environmental allergens and decrease the resulting immune response to those allergens (sneezing, itchy watery eyes, runny nose, nasal congestion, etc).    - Allergy shots improve symptoms in 75-85% of patients.  - We can discuss more at the next appointment if the medications are not working for you. - Another option is Isaiah Serge, which is a daily tablet that dissolves under your tongue to provide desensitization against dust mites (this would take the place of allergy shots): [email or web address]  3. Return in about 8 weeks (around 01/15/2024). You can have the follow up appointment with Dr. Dellis Anes or a Nurse Practicioner (our Nurse Practitioners are  excellent and always have Physician oversight!).    Please inform us of any Emergency Department visits, hospitalizations, or changes in symptoms. Call us before going to the ED for breathing or allergy symptoms since we might be able to fit you in for a sick visit. Feel free to contact us anytime with any questions, problems, or concerns.  It was a pleasure to meet you today!  Websites that have reliable patient information: 1. American Academy of Asthma, Allergy, and Immunology: www.aaaai.org 2. Food Allergy Research and Education (FARE): foodallergy.org 3. Mothers of Asthmatics: http://www.asthmacommunitynetwork.org 4. American College of Allergy, Asthma, and Immunology: www.acaai.org      "Like" [place] on Facebook and Instagram for our latest updates!      A healthy democracy works best when [employer] participate! Make sure you are registered to vote! If you have moved or changed any of your contact information, you will need to get this updated before voting! Scan the QR codes below to learn more!      Airborne Adult Perc - 11/20/23 1439     Time Antigen Placed 1440    Allergen Manufacturer Waynette Buttery    Location Back    Number of Test 55    1. Control-Buffer 50% Glycerol Negative    2. Control-Histamine 2+    3. Bahia Negative    4. French Southern Territories Negative    5. Johnson Negative    6. Kentucky Blue Negative    7. Meadow Fescue Negative    8. Perennial Rye Negative    9. Timothy Negative    10. Ragweed Mix Negative  11. Cocklebur Negative    12. Plantain,  English Negative    13. Baccharis Negative    14. Dog Fennel Negative    15. Russian Thistle Negative    16. Lamb's Quarters Negative    17. Sheep Sorrell Negative    18. Rough Pigweed Negative    19. Marsh Elder, Rough Negative    20. Mugwort, Common Negative    21. Box, Elder Negative    22. Cedar, red Negative    23. Sweet Gum Negative    24. Pecan Pollen Negative    25. Pine Mix Negative    26. Walnut, Black  Pollen Negative    27. Red Mulberry Negative    28. Ash Mix Negative    29. Birch Mix Negative    30. Beech American Negative    31. Cottonwood, Guinea-Bissau Negative    32. Hickory, White Negative    33. Maple Mix Negative    34. Oak, Guinea-[place]iss[place] Mix Negative    35. Sycamore Eastern Negative    36. Alternaria Alternata Negative    37. Cladosporium Herbarum Negative    38. Aspergillus Mix Negative    39. Penicillium Mix Negative    40. Bipolaris Sorokiniana (Helminthosporium) Negative    41. Drechslera Spicifera (Curvularia) Negative    42. Mucor Plumbeus Negative    43. Fusarium Moniliforme Negative    44. Aureobasidium Pullulans (pullulara) Negative    45. Rhizopus Oryzae Negative    46. Botrytis Cinera Negative    47. Epicoccum Nigrum Negative    48. Phoma Betae Negative    49. Dust Mite Mix 4+    50. Cat Hair 10,000 BAU/ml Negative    51.  Dog Epithelia Negative    52. Mixed Feathers Negative    53. Horse Epithelia Negative    54. Cockroach, German Negative    55. Tobacco Leaf Negative             13 Food Perc - 11/20/23 1440       Test Information   Time Antigen Placed 1440    Allergen Manufacturer Greer    Location Back    Number of allergen test 13      Food   1. Peanut Negative    2. Soybean Negative    3. Wheat Negative    4. Sesame Negative    5. Milk, Cow Negative    6. Casein Negative    7. Egg White, Chicken Negative    8. Shellfish Mix Negative    9. Fish Mix Negative    10. Cashew Negative    11. Walnut Food Negative    12. Almond Negative    13. Hazelnut Negative             Intradermal - 11/20/23 1452     Time Antigen Placed 1452    Allergen Manufacturer Waynette Buttery    Location Back    Number of Test 15    Control Negative    Bahia Negative    French Southern Territories Negative    Johnson Negative    7 Grass Negative    Ragweed Mix Negative    Weed Mix Negative    Tree Mix Negative    Mold 1 Negative    Mold 2 Negative    Mold 3 Negative    Mold 4  Negative    Cat Negative    Dog Negative    Cockroach Negative  Control of Dust Mite Allergen    Dust mites play a major role in allergic asthma and rhinitis.  They occur in environments with high humidity wherever human skin is found.  Dust mites absorb humidity from the atmosphere (ie, they do not drink) and feed on organic matter (including shed human and animal skin).  Dust mites are a microscopic type of insect that you cannot see with the naked eye.  High levels of dust mites have been detected from mattresses, pillows, carpets, upholstered furniture, bed covers, clothes, soft toys and any woven material.  The principal allergen of the dust mite is found in its feces.  A gram of dust may contain 1,000 mites and 250,000 fecal particles.  Mite antigen is easily measured in the air during house cleaning activities.  Dust mites do not bite and do not cause harm to humans, other than by triggering allergies/asthma.    Ways to decrease your exposure to dust mites in your home:  Encase mattresses, box springs and pillows with a mite-impermeable barrier or cover   Wash sheets, blankets and drapes weekly in hot water (130 F) with detergent and dry them in a dryer on the hot setting.  Have the room cleaned frequently with a vacuum cleaner and a damp dust-mop.  For carpeting or rugs, vacuuming with a vacuum cleaner equipped with a high-efficiency particulate air (HEPA) filter.  The dust mite allergic individual should not be in a room which is being cleaned and should wait 1 hour after cleaning before going into the room. Do not sleep on upholstered furniture (eg, couches).   If possible removing carpeting, upholstered furniture and drapery from the home is ideal.  Horizontal blinds should be eliminated in the rooms where the person spends the most time (bedroom, study, television room).  Washable vinyl, roller-type shades are optimal. Remove all non-washable stuffed toys from the  bedroom.  Wash stuffed toys weekly like sheets and blankets above.   Reduce indoor humidity to less than 50%.  Inexpensive humidity monitors can be purchased at most hardware stores.  Do not use a humidifier as can make the problem worse and are not recommended.        Subjective:   Denise Arias is a 43 y.o. female presenting today for follow up of No chief complaint on file.   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  Discussed the use of AI scribe software for clinical note transcription with the patient and/or guardian, who gave verbal consent to proceed.  Denise Arias is a 43 y.o. female presenting for skin testing. She was last seen on And .  We could not do testing because her insurance company does not cover testing on the same day as a New Patient visit. She has been off of all antihistamines 3 days in anticipation of the testing.   Otherwise, there have been no changes to her past medical history, surgical history, family history, or social  history.    Review of systems otherwise negative other than that mentioned in the HPI.    Objective:   There were no vitals taken for this visit. There is no height or weight on file to calculate BMI.    Physical exam deferred since this was a skin testing appointment only.   Diagnostic studies: {Blank single:19197::"none","deferred due to recent antihistamine use","labs sent instead"," "}  Spirometry: {Blank single:19197::"results normal (FEV1: ***%, FVC: ***%, FEV1/FVC: ***%)","results abnormal (FEV1: ***%, FVC: ***%, FEV1/FVC: ***%)"}.    {Blank single:19197::"Spirometry consistent with mild obstructive disease","Spirometry consistent with moderate obstructive disease","Spirometry consistent with severe obstructive disease","Spirometry consistent with possible restrictive disease","Spirometry consistent with mixed obstructive and restrictive disease","Spirometry uninterpretable due to technique","Spirometry consistent with normal pattern"}. {Blank single:19197::"Albuterol/Atrovent nebulizer","Xopenex/Atrovent nebulizer","Albuterol nebulizer","Albuterol four puffs via MDI","Xopenex four puffs via MDI"} treatment given in clinic with {Blank single:19197::"significant improvement in FEV1 per ATS criteria","significant improvement in FVC per ATS criteria","significant improvement in FEV1 and FVC per ATS criteria","improvement in FEV1, but not significant per ATS criteria","improvement in FVC, but not significant per ATS criteria","improvement in FEV1 and FVC, but not significant per ATS criteria","no improvement"}.  Allergy Studies: {Blank single:19197::"none","labs sent instead"," "}   Airborne Adult Perc - 11/20/23 1439     Time Antigen Placed 1440    Allergen Manufacturer Waynette Buttery    Location Back    Number of Test 55    1. Control-Buffer 50% Glycerol Negative    2. Control-Histamine 2+    3. Bahia Negative    4. French Southern Territ[place] Negative    5. Johnson Negative    6. Kentucky Blue Negative     7. Meadow Fescue Negative    8. Perennial Rye Negative    9. Timothy Negative    10. Ragweed Mix Negative    11. Cocklebur Negative    12. Plantain,  English Negative    13. Baccharis Negative    14. Dog Fennel Negative    15. Russian Thistle Negative    16. Lamb's Quarters Negative    17. Sheep Sorrell Negative    18. Rough Pigweed Negative    19. Marsh Elder, Rough Negative    20. Mugwort, Common Negative    21. Box, Elder Negative    22. Cedar, red Negative    23. Sweet Gum Negative    24. Pecan Pollen Negative    25. Pine Mix Negative    26. Walnut, Black Pollen Negative    27. Red Mulberry Negative    28. Ash Mix Negative    29. Birch Mix Negative    30. Beech American Negative    31. Cottonwood, Guinea-Bissau Negative    32. Hickory, White Negative    33. Maple Mix Negative    34. Oak, [place] Mix Negative    35. Sycamore Eastern Negative    36. Alternaria Alternata Negative    37. Cladosporium Herbarum Negative    38. Aspergillus Mix Negative    39. Penicillium Mix Negative    40. Bipolaris Sorokiniana (Helminthosporium) Negative    41. Drechslera Spicifera (Curvularia) Negative    42. Mucor Plumbeus Negative    43. Fusarium Moniliforme Negative    44. Aureobasidium Pullulans (  pullulara) Negative    45. Rhizopus Oryzae Negative    46. Botrytis Cinera Negative    47. Epicoccum Nigrum Negative    48. Phoma Betae Negative    49. Dust Mite Mix 4+    50. Cat Hair 10,000 BAU/ml Negative    51.  Dog Epithelia Negative    52. Mixed Feathers Negative    53. Horse Epithelia Negative    54. Cockroach, German Negative    55. Tobacco Leaf Negative             13 Food Perc - 11/20/23 1440       Test Information   Time Antigen Placed 1440    Allergen Manufacturer Greer    Location Back    Number of allergen test 13      Food   1. Peanut Negative    2. Soybean Negative    3. Wheat Negative    4. Sesame Negative    5. Milk, Cow Negative    6. Casein Negative     7. Egg White, Chicken Negative    8. Shellfish Mix Negative    9. Fish Mix Negative    10. Cashew Negative    11. Walnut Food Negative    12. Almond Negative    13. Hazelnut Negative             Intradermal - 11/20/23 1452     Time Antigen Placed 1452    Allergen Manufacturer Waynette Buttery    Location Back    Number of Test 15    Control Negative    Bahia Negative    French Southern Territories Negative    Johnson Negative    7 Grass Negative    Ragweed Mix Negative    Weed Mix Negative    Tree Mix Negative    Mold 1 Negative    Mold 2 Negative    Mold 3 Negative    Mold 4 Negative    Cat Negative    Dog Negative    Cockroach Negative             {Blank single:19197::"Allergy testing results were read and interpreted by myself, documented by clinical staff."," "}      Malachi Bonds, MD  [facility]

## 2023-11-20 NOTE — Patient Instructions (Addendum)
 1. Itching - Dust mites was the major allergen detected on testing today. - Try the avoidance measures to see if this helps. - Testing to the most common foods was completely negative, ruling out more than 95% of all food allergies.  - We will hold off on labs until the next visit. - Hopefully some avoidance measures and medications will help.  - Continue with triamcinolone 0.1% ointment twice daily as needed (safe to use anywhere below the neck).   2. Chronic rhinitis - Testing today showed: dust mites - Copy of test results provided.  - Avoidance measures provided. - Start taking: Xyzal (levocetirizine) 5mg  tablet TWICE daily - You can use an extra dose of the antihistamine, if needed, for breakthrough symptoms.  - Consider nasal saline rinses 1-2 times daily to remove allergens from the nasal cavities as well as help with mucous clearance (this is especially helpful to do before the nasal sprays are given) - Consider allergy shots as a means of long-term control. - Allergy shots "re-train" and "reset" the immune system to ignore environmental allergens and decrease the resulting immune response to those allergens (sneezing, itchy watery eyes, runny nose, nasal congestion, etc).    - Allergy shots improve symptoms in 75-85% of patients.  - We can discuss more at the next appointment if the medications are not working for you. - Another option is Isaiah Serge, which is a daily tablet that dissolves under your tongue to provide desensitization against dust mites (this would take the place of allergy shots): [email or web address]  3. Return in about 8 weeks (around 01/15/2024). You can have the follow up appointment with Dr. Dellis Anes or a Nurse Practicioner (our Nurse Practitioners are excellent and always have Physician oversight!).    Please inform us of any Emergency Department visits, hospitalizations, or changes in symptoms. Call us before going to the ED for breathing or allergy symptoms since  we might be able to fit you in for a sick visit. Feel free to contact us anytime with any questions, problems, or concerns.  It was a pleasure to meet you today!  Websites that have reliable patient information: 1. American Academy of Asthma, Allergy, and Immunology: www.aaaai.org 2. Food Allergy Research and Education (FARE): foodallergy.org 3. Mothers of Asthmatics: http://www.asthmacommunitynetwork.org 4. American College of Allergy, Asthma, and Immunology: www.acaai.org      "Like" [place] on Facebook and Instagram for our latest updates!      A healthy democracy works best when [employer] participate! Make sure you are registered to vote! If you have moved or changed any of your contact information, you will need to get this updated before voting! Scan the QR codes below to learn more!      Airborne Adult Perc - 11/20/23 1439     Time Antigen Placed 1440    Allergen Manufacturer Waynette Buttery    Location Back    Number of Test 55    1. Control-Buffer 50% Glycerol Negative    2. Control-Histamine 2+    3. Bahia Negative    4. French Southern Territories Negative    5. Johnson Negative    6. Kentucky Blue Negative    7. Meadow Fescue Negative    8. Perennial Rye Negative    9. Timothy Negative    10. Ragweed Mix Negative    11. Cocklebur Negative    12. Plantain,  English Negative    13. Baccharis Negative    14. Dog Fennel Negative    15. Guernsey Thistle Negative  16. Lamb's Quarters Negative    17. Sheep Sorrell Negative    18. Rough Pigweed Negative    19. Marsh Elder, Rough Negative    20. Mugwort, Common Negative    21. Box, Elder Negative    22. Cedar, red Negative    23. Sweet Gum Negative    24. Pecan Pollen Negative    25. Pine Mix Negative    26. Walnut, Black Pollen Negative    27. Red Mulberry Negative    28. Ash Mix Negative    29. Birch Mix Negative    30. Beech American Negative    31. Cottonwood, Guinea-Bissau Negative    32. Hickory, White Negative    33. Maple Mix Negative     34. Oak, Guinea-Bissau Mix Negative    35. Sycamore Eastern Negative    36. Alternaria Alternata Negative    37. Cladosporium Herbarum Negative    38. Aspergillus Mix Negative    39. Penicillium Mix Negative    40. Bipolaris Sorokiniana (Helminthosporium) Negative    41. Drechslera Spicifera (Curvularia) Negative    42. Mucor Plumbeus Negative    43. Fusarium Moniliforme Negative    44. Aureobasidium Pullulans (pullulara) Negative    45. Rhizopus Oryzae Negative    46. Botrytis Cinera Negative    47. Epicoccum Nigrum Negative    48. Phoma Betae Negative    49. Dust Mite Mix 4+    50. Cat Hair 10,000 BAU/ml Negative    51.  Dog Epithelia Negative    52. Mixed Feathers Negative    53. Horse Epithelia Negative    54. Cockroach, German Negative    55. Tobacco Leaf Negative             13 Food Perc - 11/20/23 1440       Test Information   Time Antigen Placed 1440    Allergen Manufacturer Greer    Location Back    Number of allergen test 13      Food   1. Peanut Negative    2. Soybean Negative    3. Wheat Negative    4. Sesame Negative    5. Milk, Cow Negative    6. Casein Negative    7. Egg White, Chicken Negative    8. Shellfish Mix Negative    9. Fish Mix Negative    10. Cashew Negative    11. Walnut Food Negative    12. Almond Negative    13. Hazelnut Negative             Intradermal - 11/20/23 1452     Time Antigen Placed 1452    Allergen Manufacturer Waynette Buttery    Location Back    Number of Test 15    Control Negative    Bahia Negative    French Southern Territories Negative    Johnson Negative    7 Grass Negative    Ragweed Mix Negative    Weed Mix Negative    Tree Mix Negative    Mold 1 Negative    Mold 2 Negative    Mold 3 Negative    Mold 4 Negative    Cat Negative    Dog Negative    Cockroach Negative             Control of Dust Mite Allergen    Dust mites play a major role in allergic asthma and rhinitis.  They occur in environments with high  humidity wherever human skin is found.  Dust  mites absorb humidity from the atmosphere (ie, they do not drink) and feed on organic matter (including shed human and animal skin).  Dust mites are a microscopic type of insect that you cannot see with the naked eye.  High levels of dust mites have been detected from mattresses, pillows, carpets, upholstered furniture, bed covers, clothes, soft toys and any woven material.  The principal allergen of the dust mite is found in its feces.  A gram of dust may contain 1,000 mites and 250,000 fecal particles.  Mite antigen is easily measured in the air during house cleaning activities.  Dust mites do not bite and do not cause harm to humans, other than by triggering allergies/asthma.    Ways to decrease your exposure to dust mites in your home:  Encase mattresses, box springs and pillows with a mite-impermeable barrier or cover   Wash sheets, blankets and drapes weekly in hot water (130 F) with detergent and dry them in a dryer on the hot setting.  Have the room cleaned frequently with a vacuum cleaner and a damp dust-mop.  For carpeting or rugs, vacuuming with a vacuum cleaner equipped with a high-efficiency particulate air (HEPA) filter.  The dust mite allergic individual should not be in a room which is being cleaned and should wait 1 hour after cleaning before going into the room. Do not sleep on upholstered furniture (eg, couches).   If possible removing carpeting, upholstered furniture and drapery from the home is ideal.  Horizontal blinds should be eliminated in the rooms where the person spends the most time (bedroom, study, television room).  Washable vinyl, roller-type shades are optimal. Remove all non-washable stuffed toys from the bedroom.  Wash stuffed toys weekly like sheets and blankets above.   Reduce indoor humidity to less than 50%.  Inexpensive humidity monitors can be purchased at most hardware stores.  Do not use a humidifier as can make the  problem worse and are not recommended.

## 2023-11-21 ENCOUNTER — Encounter: Payer: Self-pay | Admitting: Allergy & Immunology

## 2023-12-11 ENCOUNTER — Ambulatory Visit: Attending: Internal Medicine | Admitting: Internal Medicine

## 2023-12-11 ENCOUNTER — Encounter: Payer: Self-pay | Admitting: Internal Medicine

## 2023-12-11 VITALS — BP 102/67 | HR 66 | Temp 98.2°F | Ht 64.0 in | Wt 203.0 lb

## 2023-12-11 DIAGNOSIS — E66811 Obesity, class 1: Secondary | ICD-10-CM | POA: Diagnosis not present

## 2023-12-11 DIAGNOSIS — R7303 Prediabetes: Secondary | ICD-10-CM | POA: Diagnosis not present

## 2023-12-11 LAB — GLUCOSE, POCT (MANUAL RESULT ENTRY): POC Glucose: 130 mg/dL — AB (ref 70–99)

## 2023-12-11 LAB — POCT GLYCOSYLATED HEMOGLOBIN (HGB A1C): HbA1c, POC (prediabetic range): 6.1 % (ref 5.7–6.4)

## 2023-12-11 MED ORDER — TIRZEPATIDE 2.5 MG/0.5ML ~~LOC~~ SOAJ: 2.5000 mg | SUBCUTANEOUS | 0 refills | Status: DC
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
## 2023-12-11 NOTE — Patient Instructions (Signed)
 We have started you on Mounjaro 2.5 mg once a week injections.  Once you start the medication, if you develop any vomiting, abdominal pain, severe diarrhea or constipation, please stop the medicine and let us know.  Tirzepatide Injection Greggory Keen) What is this medication? TIRZEPATIDE (tir ZEP a tide) treats type 2 diabetes. It works by increasing insulin levels in your body, which decreases your blood sugar (glucose). It also reduces the amount of sugar released into your blood and slows down your digestion. Changes to diet and exercise are often combined with this medication. This medicine may be used for other purposes; ask your health care provider or pharmacist if you have questions. COMMON BRAND NAME(S): MOUNJARO What should I tell my care team before I take this medication? They need to know if you have any of these conditions: Eye disease caused by diabetes Gallbladder disease Have or have had pancreatitis Having surgery Kidney disease Personal or family history of MEN 2, a condition that causes endocrine gland tumors Personal or family history of thyroid cancer Stomach or intestine problems, such as problems digesting food An unusual or allergic reaction to tirzepatide, other medications, foods, dyes, or preservatives Pregnant or trying to get pregnant Breastfeeding How should I use this medication? This medication is injected under the skin. You will be taught how to prepare and give it. It is given once every week (every 7 days). Keep taking it unless your health care provider tells you to stop. If you use this medication with insulin, you should inject this medication and the insulin separately. Do not mix them together. Do not give the injections right next to each other. Change (rotate) injection sites with each injection. This medication comes with INSTRUCTIONS FOR USE. Ask your pharmacist for directions on how to use this medication. Read the information carefully. Talk to your  pharmacist or care team if you have questions. It is important that you put your used needles and syringes in a special sharps container. Do not put them in a trash can. If you do not have a sharps container, call your pharmacist or care team to get one. A special MedGuide will be given to you by the pharmacist with each prescription and refill. Be sure to read this information carefully each time. Talk to your care team about the use of this medication in children. Special care may be needed. Overdosage: If you think you have taken too much of this medicine contact a poison control center or emergency room at once. NOTE: This medicine is only for you. Do not share this medicine with others. What if I miss a dose? If you miss a dose, take it as soon as you can unless it is more than 4 days (96 hours) late. If it is more than 4 days late, skip the missed dose. Take the next dose at the normal time. Do not take 2 doses within 3 days of each other. What may interact with this medication? Alcohol Antiviral medications for HIV or AIDS Aspirin and aspirin-like medications Beta blockers, such as atenolol, metoprolol, propranolol Certain medications for blood pressure, heart disease, irregular heart beat Chromium Clonidine Diuretics Estrogen or progestin hormones Fenofibrate Gemfibrozil Guanethidine Isoniazid Lanreotide Female hormones or anabolic steroids MAOIs, such as Marplan, Nardil, and Parnate Medications for weight loss Medications for allergies, asthma, cold, or cough Medications for depression, anxiety, or mental health conditions Niacin Nicotine NSAIDs, medications for pain and inflammation, such as ibuprofen or naproxen Octreotide Other medications for diabetes, such as  glyburide, glipizide, or glimepiride Pasireotide Pentamidine Phenytoin Probenecid Quinolone antibiotics, such as ciprofloxacin, levofloxacin, ofloxacin Reserpine Some herbal dietary supplements Steroid  medications, such as prednisone or cortisone Sulfamethoxazole; trimethoprim Thyroid hormones Warfarin This list may not describe all possible interactions. Give your health care provider a list of all the medicines, herbs, non-prescription drugs, or dietary supplements you use. Also tell them if you smoke, drink alcohol, or use illegal drugs. Some items may interact with your medicine. What should I watch for while using this medication? Visit your care team for regular checks on your progress. Tell your care team if your symptoms do not start to get better or if they get worse. You may need blood work done while you are taking this medication. Your care team will monitor your HbA1C (A1C). This test shows what your average blood sugar (glucose) level was over the past 2 to 3 months. Know the symptoms of low blood sugar and know how to treat it. Always carry a source of quick sugar with you. Examples include hard sugar candy or glucose tablets. Make sure others know that you can choke if you eat or drink if your blood sugar is too low and you are unable to care for yourself. Get medical help at once. Tell your care team if you have high blood sugar. Your medication dose may change if your body is under stress. Some types of stress that may affect your blood sugar include fever, infection, and surgery. Do not share pens or cartridges with anyone, even if the needle is changed. Each pen should only be used by one person. Sharing could cause an infection. Wear a medical ID bracelet or chain. Carry a card that describes your condition. List the medications and doses you take on the card. Talk to your care team about your risk of cancer. You may be more at risk for certain types of cancer if you take this medication. Talk to your care team right away if you have a lump or swelling in your neck, hoarseness that does not go away, trouble swallowing, shortness of breath, or trouble breathing. Make sure you stay  hydrated while taking this medication. Drink water often. Eat fruits and veggies that have a high water content. Drink more water when it is hot or you are active. Talk to your care team right away if you have fever, infection, vomiting, diarrhea, or if you sweat a lot while taking this medication. The loss of too much body fluid may make it dangerous for you to take this medication. If you are going to need surgery or a procedure, tell your care team that you are taking this medication. Estrogen and progestin hormones that you take by mouth may not work as well while you are taking this medication. Switch to a non-oral contraceptive or add a barrier contraceptive for 4 weeks after starting this medication and after each dose increase. Talk to your care team about contraceptive options. They can help you find the option that works for you. What side effects may I notice from receiving this medication? Side effects that you should report to your care team as soon as possible: Allergic reactions or angioedema--skin rash, itching or hives, swelling of the face, eyes, lips, tongue, arms, or legs, trouble swallowing or breathing Change in vision Dehydration--increased thirst, dry mouth, feeling faint or lightheaded, headache, dark yellow or brown urine Gallbladder problems--severe stomach pain, nausea, vomiting, fever Kidney injury--decrease in the amount of urine, swelling of the ankles, hands,  or feet Pancreatitis--severe stomach pain that spreads to your back or gets worse after eating or when touched, fever, nausea, vomiting Thoughts of suicide or self-harm, worsening mood, feelings of depression Thyroid cancer--new mass or lump in the neck, pain or trouble swallowing, trouble breathing, hoarseness Side effects that usually do not require medical attention (report these to your care team if they continue or are bothersome): Diarrhea Loss of appetite Nausea Upset stomach This list may not describe  all possible side effects. Call your doctor for medical advice about side effects. You may report side effects to FDA at 1-800-FDA-1088. Where should I keep my medication? Keep out of the reach of children and pets. Refrigeration (preferred): Store in the refrigerator. Keep this medication in the original carton until you are ready to take it. Do not freeze. Protect from light. Get rid of opened vials after use, even if there is medication left. Get rid of any unopened vials or pens after the expiration date. Room Temperature: This medication may be stored at room temperature below 30 degrees C (86 degrees F) for up to 21 days. Keep this medication in the original carton until you are ready to take it. Protect from light. Avoid exposure to extreme heat. Get rid of opened vials after use, even if there is medication left. Get rid of any unopened vials or pens after 21 days, or after they expire, whichever is first. To get rid of medications that are no longer needed or have expired: Take the medication to a medication take-back program. Check with your pharmacy or law enforcement to find a location. If you cannot return the medication, ask your pharmacist or care team how to get rid of this medication safely. NOTE: This sheet is a summary. It may not cover all possible information. If you have questions about this medicine, talk to your doctor, pharmacist, or health care provider.  2024 Elsevier/Gold Standard (2023-08-01 00:00:00)

## 2023-12-12 ENCOUNTER — Other Ambulatory Visit: Payer: Self-pay

## 2023-12-14 ENCOUNTER — Telehealth: Payer: Self-pay | Admitting: Internal Medicine

## 2023-12-14 MED ORDER — WEGOVY 0.25 MG/0.5ML ~~LOC~~ SOAJ: 0.2500 mg | SUBCUTANEOUS | 0 refills | Status: DC

## 2023-12-14 NOTE — Telephone Encounter (Signed)
 Let patient know that her insurance will not cover for Mounjaro but may cover for [facility].  Frederik Jansky has the same active ingredient as Ozempic.  The Frederik Jansky works similar to Mounjaro and has the same possible side effects that we had discussed.  I will send a prescription to her pharmacy for the [facility].  We will start at the lowest dose which is 0.25 mg injection once a week.  Once the prescription is filled, she should have the pharmacist show her how to administer the injections. After being on the medication for 1 mth, if she is tolerating it, she should let me know so that dose can be increased to 0.5 mg. She should stop the medication and be seen if she develops any vomiting, abdominal pain, severe diarrhea or constipation.

## 2023-12-14 NOTE — Telephone Encounter (Signed)
-----   Message from Vivienne Grove sent at 12/12/2023 10:59 AM EDT ----- Regarding: RE: Florence Hunt PA Insurance will not cover Mounjaro without a type 2 diabetes diagnosis. Her [employer] managed care Medicaid should cover Wegovy if this is an appropriate alternative? ----- Message ----- From: Lawrance Presume, MD Sent: 12/11/2023  10:32 PM EDT To: Vivienne Grove, CPhT SubjectFlorence Hunt PA                                    May need PA on Mounjaro.

## 2023-12-15 ENCOUNTER — Other Ambulatory Visit: Payer: Self-pay

## 2023-12-15 NOTE — Telephone Encounter (Signed)
 Thanks [employer]. Denise Arias please call the patient and verify that she fill prescription for Mounjaro. Denise Arias is saying that it is covered by her insurance and looks like it was filled.

## 2023-12-16 NOTE — Telephone Encounter (Signed)
 Called & spoke to the patient. Verified name & DOB. Informed that medication is covered by insurance. Inquired if she has picked up medication. Patient stated that neither her or her husband has picked up Mounjaro. Informed patient that I would call back after speaking with her pharmacy.  Called pharmacy and was informed that medication has not been picked up but is ready for pick up.  Called patient but LVM informing that medication is ready for pick-up.

## 2023-12-18 ENCOUNTER — Ambulatory Visit: Admitting: Dietician

## 2024-01-07 ENCOUNTER — Ambulatory Visit: Admitting: Dietician

## 2024-01-12 ENCOUNTER — Encounter (HOSPITAL_BASED_OUTPATIENT_CLINIC_OR_DEPARTMENT_OTHER): Payer: Self-pay | Admitting: Emergency Medicine

## 2024-01-12 ENCOUNTER — Emergency Department (HOSPITAL_BASED_OUTPATIENT_CLINIC_OR_DEPARTMENT_OTHER)
Admission: EM | Admit: 2024-01-12 | Discharge: 2024-01-12 | Disposition: A | Discharge status: Home / Self Care | Attending: Emergency Medicine | Admitting: Emergency Medicine

## 2024-01-12 DIAGNOSIS — R238 Other skin changes: Secondary | ICD-10-CM | POA: Diagnosis not present

## 2024-01-12 DIAGNOSIS — R21 Rash and other nonspecific skin eruption: Secondary | ICD-10-CM | POA: Diagnosis present

## 2024-01-12 MED ORDER — HYDROXYZINE HCL 25 MG PO TABS: 25.0000 mg | ORAL_TABLET | Freq: Four times a day (QID) | ORAL | 0 refills | Status: AC

## 2024-01-12 MED ORDER — DIPHENHYDRAMINE HCL 25 MG PO CAPS
50.0000 mg | ORAL_CAPSULE | Freq: Once | ORAL | Status: AC
  Administered 2024-01-12: 50 mg via ORAL
  Filled 2024-01-12: qty 2

## 2024-01-12 MED ORDER — DIPHENHYDRAMINE HCL 25 MG PO TABS: 25.0000 mg | ORAL_TABLET | Freq: Four times a day (QID) | ORAL | 0 refills | Status: AC | PRN

## 2024-01-12 NOTE — ED Provider Notes (Signed)
 [place] EMERGENCY DEPARTMENT AT [facility] Provider Note   CSN: [id number] Arrival date & time: 01/12/24  1758     History  Chief Complaint  Patient presents with   Rash    Denise Arias is a 43 y.o. female.  With past medical history of anemia, allergies, eczema, [place] area is presenting to emergency room with complaint of 1 week of itching rash diffusely throughout skin.  He reports she has many known allergies but she denies any new exposure to food, beauty products, clothes, outdoors or animals. She is unsure what started this but has noted that over the past few days her skin has had some texture, irritation and itch that she describes as a pinching sensation.  She has not noticed any involvement in her mucous membranes.  She has not noted fever.  She has not noted any change in the color of her urine or her stool.  She does not have scleral icterus.  She does note significant increased stress over recent family member getting sick and needing intensive care in hospital.   Rash      Home Medications Prior to Admission medications   Medication Sig Start Date End Date Taking? Authorizing Provider  levocetirizine (XYZAL ) 5 MG tablet Take 1 tablet (5 mg total) by mouth every evening. 11/20/23   Rochester Chuck, MD  Semaglutide -Weight Management (WEGOVY ) 0.25 MG/0.5ML SOAJ Inject 0.25 mg into the skin once a week. 12/14/23   Lawrance Presume, MD  triamcinolone  ointment (KENALOG ) 0.1 % Apply 1 Application topically 2 (two) times daily as needed. 11/11/23   Rochester Chuck, MD      Allergies    Molds & smuts, Amoxicillin , Banana, Fish allergy , Fish oil, Minocycline, Multivitamins, Strawberry extract, Tizanidine , Tramadol , Vitamin c, Banana extract allergy  skin test, Other, and Fish oil    Review of Systems   Review of Systems  Skin:  Positive for rash.    Physical Exam Updated Vital Signs BP 119/69 (BP Location: Right Arm)   Pulse 69   Temp 98.4 F  (36.9 C) (Oral)   Resp 18   SpO2 98%  Physical Exam Vitals and nursing note reviewed.  Constitutional:      General: She is not in acute distress.    Appearance: She is not toxic-appearing.  HENT:     Head: Normocephalic and atraumatic.  Eyes:     General: No scleral icterus.       Right eye: No discharge.        Left eye: No discharge.     Conjunctiva/sclera: Conjunctivae normal.  Cardiovascular:     Rate and Rhythm: Normal rate and regular rhythm.     Pulses: Normal pulses.     Heart sounds: Normal heart sounds.  Pulmonary:     Effort: Pulmonary effort is normal. No respiratory distress.     Breath sounds: Normal breath sounds.  Abdominal:     General: Abdomen is flat. Bowel sounds are normal.     Palpations: Abdomen is soft.     Tenderness: There is no abdominal tenderness.  Skin:    General: Skin is warm and dry.     Coloration: Skin is not jaundiced.     Findings: No lesion.     Comments: No visible rash at this time.  But describes diffuse itching sensation to skin and face.  Neurological:     General: No focal deficit present.     Mental Status: She is alert and oriented to person,  place, and time. Mental status is at baseline.     ED Results / Procedures / Treatments   Labs (all labs ordered are listed, but only abnormal results are displayed) Labs Reviewed - No data to display  EKG None  Radiology No results found.  Procedures Procedures    Medications Ordered in ED Medications - No data to display  ED Course/ Medical Decision Making/ A&P                                 Medical Decision Making Risk OTC drugs. Prescription drug management.   This patient presents to the ED for concern of rash, this involves an extensive number of treatment options, and is a complaint that carries with it a high risk of complications and morbidity.  The differential diagnosis includes SJS, viral exanthem, [place] area, liver failure, cellulitis,  dermatitis   Problem List / ED Course / Critical interventions / Medication management  Reporting to emergency room with complaint of [place] area.  On my exam she does not have any raised bumps on skin but notes some diffuse itching.  No known irritant or new exposure.  She has no mucous membrane involvement, negative Nikolsky sign and no new medications.  She has no sign of systemic illness.  She has not noted any other symptoms concerning for hepatitis/liver disease.  I offered to order basic labs, but patient declines stating she has sick family member at home she needs to go take care of.  Overall her workup and vitals are reassuring and she is very well-appearing.  Will symptom management with Benadryl  and evening and Atarax  during the morning.  Feel stress could certainly be contributing to some of her symptoms but do feel she needs to have appropriate follow-up with her primary care doctor or dermatology.  Patient is expresses understanding and agrees to plan.  Given return precautions.  I have reviewed the patients home medicines and have made adjustments as needed   Plan  F/u w/ PCP in 2-3d to ensure resolution of sx.  Patient was given return precautions. Patient stable for discharge at this time.  Patient educated on sx/dx and verbalized understanding of plan. Return to ER w/ new or worsening sx.          Final Clinical Impression(s) / ED Diagnoses Final diagnoses:  Skin irritation    Rx / DC Orders ED Discharge Orders          Ordered    diphenhydrAMINE  (BENADRYL ) 25 MG tablet  Every 6 hours PRN        01/12/24 2144    hydrOXYzine  (ATARAX ) 25 MG tablet  Every 6 hours        01/12/24 2144              Marchell Froman, Kandace Organ, PA-C 01/12/24 2205    Arvilla Birmingham, MD 01/12/24 2310

## 2024-01-12 NOTE — ED Triage Notes (Signed)
 Reports pinching sensation all over on skin. Started 1 week ago Has used itch cream and it helped some

## 2024-01-12 NOTE — Discharge Instructions (Signed)
 Wash skin with gentle soap and water.  You can apply lotion like CeraVe or Aquaphor.  Take Atarax  during the day and Benadryl  and evening for symptoms. Use as needed. Follow up with PCP.   If you notice yellow color to your eyes, abdominal pain or change in the color of your stool please return for lab work.

## 2024-01-15 ENCOUNTER — Ambulatory Visit: Admitting: Allergy & Immunology

## 2024-01-19 ENCOUNTER — Ambulatory Visit: Payer: Self-pay

## 2024-01-19 NOTE — Telephone Encounter (Signed)
 Patient going to [facility] tomorrow

## 2024-01-19 NOTE — Telephone Encounter (Signed)
  Chief Complaint: Ear pain Symptoms: right ear pain, sore throat, upset stomach Frequency: two days Pertinent Negatives: Patient denies fever Disposition: [] ED /[] Urgent Care (no appt availability in office) / [] Appointment(In office/virtual)/ []  [facility]/ [] Home Care/ [] Refused Recommended Disposition /[] [facility]/ [x]  Follow-up with PCP Additional Notes: patient calling with 2 days of right ear pain, sore throat and upset stomach. Patient states symptoms came out of nowhere. Patient is wanting to be seen in the office. Per protocol, patient is recommended to be seen in 24 hours. Explained no availability in the office-patient is wanting to be seen in the office instead of Urgent Care. Explained that patient will receive a phone call from office. Patient verbalized understanding and all questions answered.    Copied from CRM [id number]. Topic: Clinical - Red Word Triage >> Jan 19, 2024  3:10 PM Essie A wrote: Red Word that prompted transfer to Nurse Triage: Ear infection for 2 days, sore throat on right side and upset stomach. Reason for Disposition  Earache  (Exceptions: brief ear pain of < 60 minutes duration, earache occurring during air travel  Answer Assessment - Initial Assessment Questions 1. LOCATION: "Which ear is involved?"     Right ear 2. ONSET: "When did the ear start hurting"      2 days ago 3. SEVERITY: "How bad is the pain?"  (Scale 1-10; mild, moderate or severe)   - MILD (1-3): doesn't interfere with normal activities    - MODERATE (4-7): interferes with normal activities or awakens from sleep    - SEVERE (8-10): excruciating pain, unable to do any normal activities      4 out of 10 4. URI SYMPTOMS: "Do you have a runny nose or cough?"     no 5. FEVER: "Do you have a fever?" If Yes, ask: "What is your temperature, how was it measured, and when did it start?"     no 6. CAUSE: "Have you been swimming recently?", "How often do you use Q-TIPS?",  "Have you had any recent air travel or scuba diving?"     no 7. OTHER SYMPTOMS: "Do you have any other symptoms?" (e.g., headache, stiff neck, dizziness, vomiting, runny nose, decreased hearing)     Sore throat, sore throat 8. PREGNANCY: "Is there any chance you are pregnant?" "When was your last menstrual period?"     no  Protocols used: Earache-A-AH

## 2024-01-20 NOTE — Patient Instructions (Incomplete)
 1. Itching - Dust mites was the major allergen detected on testing on 11/20/23. - Testing to the most common foods on 11/20/23 was completely negative, ruling out more than 95% of all food allergies.  - Hopefully some avoidance measures and medications will help.  - Continue with triamcinolone  0.1% ointment twice daily as needed (safe to use anywhere below the neck).   2. Allergic rhinitis - Testing 11/20/23 showed: dust mites - Follow avoidance measures  - Continue: Xyzal  (levocetirizine) 5mg  tablet TWICE daily - You can use an extra dose of the antihistamine, if needed, for breakthrough symptoms.  - Consider nasal saline rinses 1-2 times daily to remove allergens from the nasal cavities as well as help with mucous clearance (this is especially helpful to do before the nasal sprays are given) - Consider allergy  shots as a means of long-term control. - Allergy  shots "re-train" and "reset" the immune system to ignore environmental allergens and decrease the resulting immune response to those allergens (sneezing, itchy watery eyes, runny nose, nasal congestion, etc).    - Allergy  shots improve symptoms in 75-85% of patients.  - We can discuss more at the next appointment if the medications are not working for you. - Another option is Odactra, which is a daily tablet that dissolves under your tongue to provide desensitization against dust mites (this would take the place of allergy  shots): [email or web address]  3. Follow up in months or sooner if needed     Control of Dust Mite Allergen    Dust mites play a major role in allergic asthma and rhinitis.  They occur in environments with high humidity wherever human skin is found.  Dust mites absorb humidity from the atmosphere (ie, they do not drink) and feed on organic matter (including shed human and animal skin).  Dust mites are a microscopic type of insect that you cannot see with the naked eye.  High levels of dust mites have been detected from  mattresses, pillows, carpets, upholstered furniture, bed covers, clothes, soft toys and any woven material.  The principal allergen of the dust mite is found in its feces.  A gram of dust may contain 1,000 mites and 250,000 fecal particles.  Mite antigen is easily measured in the air during house cleaning activities.  Dust mites do not bite and do not cause harm to humans, other than by triggering allergies/asthma.    Ways to decrease your exposure to dust mites in your home:  Encase mattresses, box springs and pillows with a mite-impermeable barrier or cover   Wash sheets, blankets and drapes weekly in hot water (130 F) with detergent and dry them in a dryer on the hot setting.  Have the room cleaned frequently with a vacuum cleaner and a damp dust-mop.  For carpeting or rugs, vacuuming with a vacuum cleaner equipped with a high-efficiency particulate air (HEPA) filter.  The dust mite allergic individual should not be in a room which is being cleaned and should wait 1 hour after cleaning before going into the room. Do not sleep on upholstered furniture (eg, couches).   If possible removing carpeting, upholstered furniture and drapery from the home is ideal.  Horizontal blinds should be eliminated in the rooms where the person spends the most time (bedroom, study, television room).  Washable vinyl, roller-type shades are optimal. Remove all non-washable stuffed toys from the bedroom.  Wash stuffed toys weekly like sheets and blankets above.   Reduce indoor humidity to less than 50%.  Inexpensive humidity monitors  can be purchased at most hardware stores.  Do not use a humidifier as can make the problem worse and are not recommended.

## 2024-01-21 ENCOUNTER — Ambulatory Visit: Admitting: Family

## 2024-02-06 ENCOUNTER — Ambulatory Visit: Payer: BC Managed Care – PPO | Admitting: Internal Medicine

## 2024-02-09 ENCOUNTER — Ambulatory Visit: Admitting: Internal Medicine

## 2024-03-15 ENCOUNTER — Telehealth: Payer: Self-pay | Admitting: Nurse Practitioner

## 2024-03-15 NOTE — Telephone Encounter (Signed)
 Called pt to confirm appt.Pt did not answer and could not LVM.

## 2024-03-16 ENCOUNTER — Ambulatory Visit: Attending: Nurse Practitioner | Admitting: Nurse Practitioner

## 2024-03-16 ENCOUNTER — Encounter: Payer: Self-pay | Admitting: Nurse Practitioner

## 2024-03-16 VITALS — BP 108/72 | HR 65 | Resp 19 | Ht 64.0 in | Wt 201.6 lb

## 2024-03-16 DIAGNOSIS — Z1231 Encounter for screening mammogram for malignant neoplasm of breast: Secondary | ICD-10-CM | POA: Diagnosis not present

## 2024-03-16 DIAGNOSIS — Z1211 Encounter for screening for malignant neoplasm of colon: Secondary | ICD-10-CM

## 2024-03-16 DIAGNOSIS — E66811 Obesity, class 1: Secondary | ICD-10-CM | POA: Diagnosis not present

## 2024-03-16 MED ORDER — TIRZEPATIDE 5 MG/0.5ML ~~LOC~~ SOAJ: 5.0000 mg | SUBCUTANEOUS | 1 refills | Status: DC

## 2024-03-16 NOTE — Progress Notes (Signed)
 Assessment & Plan:  [name] was seen today for weight managment.  Diagnoses and all orders for this visit:  Obesity (BMI 30.0-34.9) Dosage increased today. She was instructed to stop the 5mg  dose if she has undesirable side effects and dosage will be adjusted down to 2.5 -     tirzepatide  (MOUNJARO ) 5 MG/0.5ML Pen; Inject 5 mg into the skin once a week.  Breast cancer screening by mammogram -     MM 3D SCREENING MAMMOGRAM BILATERAL BREAST; Future  Colon cancer screening -     Ambulatory referral to Gastroenterology    Patient has been counseled on age-appropriate routine health concerns for screening and prevention. These are reviewed and up-to-date. Referrals have been placed accordingly. Immunizations are up-to-date or declined.    Subjective:   Chief Complaint  Patient presents with   weight managment    Denise Arias 43 y.o. female presents to office today FOR WEIGHT CHECK  She is a patient of Dr. Vicci  She was started on Mounjaro  by her PCP on 12-11-2023 for weight management. However today she reports she just started taking the medication 4 weeks ago. As of today she has lost 2lbs in 4 weeks.  She notes associated palpitations late at night that wake her up. Describes it as her heart racing and only lasting for a minute and goes away on its own. Other symptoms include headache. She does not endorse nausea, vomiting or abdominal pain. Wt Readings from Last 3 Encounters:  03/16/24 201 lb 9.6 oz (91.4 kg)  12/11/23 203 lb (92.1 kg)  11/18/23 204 lb 4.8 oz (92.7 kg)    BP Readings from Last 3 Encounters:  03/16/24 108/72  01/12/24 126/69  12/11/23 102/67       Review of Systems  Constitutional:  Negative for fever, malaise/fatigue and weight loss.  HENT: Negative.  Negative for nosebleeds.   Eyes: Negative.  Negative for blurred vision, double vision and photophobia.  Respiratory: Negative.  Negative for cough and shortness of breath.   Cardiovascular:   Positive for palpitations. Negative for chest pain and leg swelling.  Gastrointestinal: Negative.  Negative for heartburn, nausea and vomiting.  Musculoskeletal: Negative.  Negative for myalgias.  Neurological:  Positive for headaches. Negative for dizziness, focal weakness and seizures.  Psychiatric/Behavioral: Negative.  Negative for suicidal ideas.     Past Medical History:  Diagnosis Date   Allergy     RHINITIS   Eczema    GERD (gastroesophageal reflux disease)    Neck pain    Palpitations    Urticaria     Past Surgical History:  Procedure Laterality Date   BREAST REDUCTION SURGERY Bilateral 11/21/2020   Procedure: MAMMARY REDUCTION  (BREAST);  Surgeon: Elisabeth Craig RAMAN, MD;  Location: [facility];  Service: Plastics;  Laterality: Bilateral;  2 hours   NO PAST SURGERIES     VAGINAL DELIVERY     X 4    Family History  Problem Relation Age of Onset   Diabetes Mother    Hypertension Mother    Other Father        unsure of medical history   Thyroid  disease Sister        goiter   Diabetes Sister    Other Brother        murdered   Asthma Maternal Uncle    Asthma Daughter     Social History Reviewed with no changes to be made today.   Outpatient Medications Prior to Visit  Medication  Sig Dispense Refill   diphenhydrAMINE  (BENADRYL ) 25 MG tablet Take 1 tablet (25 mg total) by mouth every 6 (six) hours as needed. 30 tablet 0   hydrOXYzine  (ATARAX ) 25 MG tablet Take 1 tablet (25 mg total) by mouth every 6 (six) hours. 12 tablet 0   levocetirizine (XYZAL ) 5 MG tablet Take 1 tablet (5 mg total) by mouth every evening. 90 tablet 1   MOUNJARO  2.5 MG/0.5ML Pen Inject 2.5 mg into the skin once a week.     triamcinolone  ointment (KENALOG ) 0.1 % Apply 1 Application topically 2 (two) times daily as needed. (Patient not taking: Reported on 03/16/2024) 454 g 0   Semaglutide -Weight Management (WEGOVY ) 0.25 MG/0.5ML SOAJ Inject 0.25 mg into the skin once a week. (Patient  not taking: Reported on 03/16/2024) 2 mL 0   No facility-administered medications prior to visit.    Allergies  Allergen Reactions   Molds & Smuts Anaphylaxis    Patient unknown reaction but test stated I am allergic Patient unknown reaction but test stated I am allergic    Amoxicillin  Nausea And Vomiting   Banana Hives   Fish Allergy  Other (See Comments)   Fish Oil Rash   Minocycline Rash and Hives    Rash per patient report. Rash per patient report.   Multivitamins Other (See Comments)    sd her stomach/intestinal area swelled up after 2 hrs. sd her stomach/intestinal area swelled up after 2 hrs.   Strawberry Extract Rash and Other (See Comments)   Tizanidine  Other (See Comments)    Causes eyes to turn red. Causes eyes to turn red.   Tramadol  Other (See Comments)    Heart works fast Eye become red Heart works fast Eye become red   Vitamin C Rash   Banana Extract Allergy  Skin Test Other (See Comments)   Other Itching    nuts with itching in ears, nose and throat per patient   Fish Oil Rash       Objective:    BP 108/72 (BP Location: Left Arm, Patient Position: Sitting, Cuff Size: Normal)   Pulse 65   Resp 19   Ht 5' 4 (1.626 m)   Wt 201 lb 9.6 oz (91.4 kg)   SpO2 99%   BMI 34.60 kg/m  Wt Readings from Last 3 Encounters:  03/16/24 201 lb 9.6 oz (91.4 kg)  12/11/23 203 lb (92.1 kg)  11/18/23 204 lb 4.8 oz (92.7 kg)    Physical Exam Vitals and nursing note reviewed.  Constitutional:      Appearance: She is well-developed.  HENT:     Head: Normocephalic and atraumatic.  Cardiovascular:     Rate and Rhythm: Normal rate and regular rhythm.     Heart sounds: Normal heart sounds. No murmur heard.    No friction rub. No gallop.  Pulmonary:     Effort: Pulmonary effort is normal. No tachypnea or respiratory distress.     Breath sounds: Normal breath sounds. No decreased breath sounds, wheezing, rhonchi or rales.  Chest:     Chest wall: No  tenderness.  Abdominal:     General: Bowel sounds are normal.     Palpations: Abdomen is soft.  Musculoskeletal:        General: Normal range of motion.     Cervical back: Normal range of motion.  Skin:    General: Skin is warm and dry.  Neurological:     Mental Status: She is alert and oriented to person, place, and time.  Coordination: Coordination normal.  Psychiatric:        Behavior: Behavior normal. Behavior is cooperative.        Thought Content: Thought content normal.        Judgment: Judgment normal.          Patient has been counseled extensively about nutrition and exercise as well as the importance of adherence with medications and regular follow-up. The patient was given clear instructions to go to ER or return to medical center if symptoms don't improve, worsen or new problems develop. The patient verbalized understanding.   Follow-up: Return in about 8 weeks (around 05/11/2024) for weight check dr. vicci.   Haze LELON Servant, FNP-BC [facility] [place], [place] [phone]   03/16/2024, 3:52 PM

## 2024-03-16 NOTE — Patient Instructions (Addendum)
 [facility] Located in: [facility] Address: [address], [place], [place] [address] Phone: [phone]    [facility] Located in: [facility] [address] Address: [address], [place], [place] [address] Phone: [phone]

## 2024-03-31 ENCOUNTER — Ambulatory Visit

## 2024-03-31 ENCOUNTER — Ambulatory Visit
Admission: RE | Admit: 2024-03-31 | Discharge: 2024-03-31 | Disposition: A | Discharge status: Home / Self Care | Source: Ambulatory Visit | Attending: Nurse Practitioner | Admitting: Nurse Practitioner

## 2024-03-31 DIAGNOSIS — Z1231 Encounter for screening mammogram for malignant neoplasm of breast: Secondary | ICD-10-CM

## 2024-04-06 ENCOUNTER — Ambulatory Visit: Payer: Self-pay | Admitting: Nurse Practitioner

## 2024-05-11 ENCOUNTER — Ambulatory Visit: Admitting: Internal Medicine

## 2024-05-21 ENCOUNTER — Encounter: Payer: Self-pay | Admitting: Plastic Surgery

## 2024-05-21 ENCOUNTER — Ambulatory Visit: Admitting: Plastic Surgery

## 2024-05-21 VITALS — BP 116/78 | HR 63 | Ht 64.0 in | Wt 190.6 lb

## 2024-05-21 DIAGNOSIS — M793 Panniculitis, unspecified: Secondary | ICD-10-CM

## 2024-05-21 DIAGNOSIS — E65 Localized adiposity: Secondary | ICD-10-CM | POA: Diagnosis not present

## 2024-05-21 NOTE — Progress Notes (Addendum)
" ° °  Subjective:    Patient ID: Denise Arias, female    DOB: 1981/07/01, 43 y.o.   MRN: [id number]  The patient is a 43 year old female here for follow-up on her abdomen.  She was seen in April for possibility of a panniculectomy.  At the time she was 204 pounds.  She is 5 feet 4 inches tall and today she weighs 190 pounds.  She has been seeing a nutritionist and increasing her activities.  She also started Mounjaro  and feels that it is helping.  She has 5 kids at home.  She has a history of reflux and eczema.  She has had a breast reduction in the past.  She does not have any signs of a hernia.  Her abdomen is hanging and she does have some pain because of this.      Review of Systems  Constitutional:  Positive for activity change.  Eyes: Negative.   Respiratory: Negative.    Cardiovascular: Negative.   Gastrointestinal: Negative.   Endocrine: Negative.   Genitourinary: Negative.        Objective:   Physical Exam Vitals reviewed.  HENT:     Head: Atraumatic.  Cardiovascular:     Rate and Rhythm: Normal rate.     Pulses: Normal pulses.  Abdominal:     General: There is no distension.     Palpations: Abdomen is soft.  Neurological:     Mental Status: She is alert and oriented to person, place, and time.  Psychiatric:        Mood and Affect: Mood normal.        Behavior: Behavior normal.        Thought Content: Thought content normal.        Judgment: Judgment normal.        Assessment & Plan:     ICD-10-CM   1. Abdominal obesity  E65     2. 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  I encouraged her to continue and then see us  back in 3 months.  I would like to see if she can get off 10 pounds.  The patient is in agreement.   "

## 2024-05-26 ENCOUNTER — Telehealth: Payer: Self-pay | Admitting: Internal Medicine

## 2024-05-26 NOTE — Telephone Encounter (Signed)
 Lvm to confirm appt for 9/25

## 2024-05-27 ENCOUNTER — Ambulatory Visit: Attending: Internal Medicine | Admitting: Internal Medicine

## 2024-05-27 VITALS — BP 105/68 | HR 54 | Temp 98.1°F | Ht 64.0 in | Wt 190.0 lb

## 2024-05-27 DIAGNOSIS — M79645 Pain in left finger(s): Secondary | ICD-10-CM | POA: Diagnosis not present

## 2024-05-27 DIAGNOSIS — E66811 Obesity, class 1: Secondary | ICD-10-CM

## 2024-05-27 DIAGNOSIS — R431 Parosmia: Secondary | ICD-10-CM

## 2024-05-27 DIAGNOSIS — K219 Gastro-esophageal reflux disease without esophagitis: Secondary | ICD-10-CM | POA: Diagnosis not present

## 2024-05-27 MED ORDER — OMEPRAZOLE 20 MG PO CPDR: 20.0000 mg | DELAYED_RELEASE_CAPSULE | Freq: Every day | ORAL | 3 refills | Status: AC
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
## 2024-05-27 NOTE — Patient Instructions (Addendum)
 Placed in [facility] [address] [place], [place] [address] PH# [phone]    VISIT SUMMARY: Today, you were seen for heartburn and acid reflux, a persistent issue with your sense of smell, weight management, and pain in your right finger. We discussed your symptoms, current medications, and lifestyle habits to create a comprehensive plan for your health.  YOUR PLAN: -GASTROESOPHAGEAL REFLUX DISEASE (GERD) WITH INTERMITTENT DYSPEPSIA: GERD is a condition where stomach acid frequently flows back into the tube connecting your mouth and stomach, causing heartburn and discomfort. You will continue taking omeprazole  daily to manage your symptoms. Avoid spicy foods, acidic juices, and foods with tomato paste or sauce. Eat your last meal at least two hours before lying down and try to sleep with your head elevated.  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 There is no current treatment to restore your sense of smell, but taking omeprazole  daily may help if the issue is related to GERD.  -OBESITY ON GLP-1 AGONIST THERAPY: Obesity is a condition characterized by excessive body fat. You have been managing your weight with Mounjaro , which has helped reduce your appetite and contributed to weight loss. Continue taking Mounjaro  at 2.5 mg, stay physically active, and avoid sugary snacks and drinks.  -RIGHT FINGER MASS, POSSIBLE CYST OR SCAR TISSUE: You have a painful mass on your right finger, likely scar tissue from an old injury. You will be referred to a hand surgeon for further evaluation.  INSTRUCTIONS: Please follow up with a hand surgeon for the evaluation of your right finger mass. Continue taking omeprazole  daily and Mounjaro  at 2.5 mg. Avoid spicy foods, acidic juices, and foods with tomato paste or sauce. Eat your last meal at least two hours before lying down and try to sleep with your  head elevated. Stay physically active and avoid sugary snacks and drinks.                      Contains text generated by Abridge.                                 Contains text generated by Abridge.

## 2024-05-29 ENCOUNTER — Encounter: Payer: Self-pay | Admitting: Internal Medicine

## 2024-05-29 MED ORDER — TIRZEPATIDE 5 MG/0.5ML ~~LOC~~ SOAJ: 5.0000 mg | SUBCUTANEOUS | 1 refills | Status: DC

## 2024-06-07 ENCOUNTER — Encounter: Payer: Self-pay | Admitting: Nurse Practitioner

## 2024-06-09 ENCOUNTER — Ambulatory Visit: Admitting: Physician Assistant

## 2024-06-09 ENCOUNTER — Other Ambulatory Visit: Payer: Self-pay

## 2024-06-09 ENCOUNTER — Encounter: Payer: Self-pay | Admitting: Physician Assistant

## 2024-06-09 DIAGNOSIS — M79645 Pain in left finger(s): Secondary | ICD-10-CM | POA: Diagnosis not present

## 2024-06-09 NOTE — Progress Notes (Signed)
 Office Visit Note   Patient: Denise Arias           Date of Birth: [birth year]           MRN: [id number] Visit Date: 06/09/2024              Requested by: Vicci Barnie NOVAK, MD [address] [place] [address] [place],  [place] [address] PCP: Vicci Barnie NOVAK, MD   Assessment & Plan: Visit Diagnoses:  1. Pain of left middle finger     Plan: Patient is a pleasant 43 year old woman who presents today with deformity and pain in her middle left finger.  She said when she was a little girl she was helping her mother cook and was cutting and cut the volar side of her middle finger at the joint.  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  Explained to her that only resolution at this point would be a fusion.  She also complains of pain over the middle phalanx.  That has been noticed for the last 2 years though no injury except the one we talked about.  She does have some cystic change on the bone.  I told her I would review these x-rays with Dr. Erling rang and contact her for the next best plan  Follow-Up Instructions: Will contact her after I discussed this with Dr. Arlinda  Orders:  Orders Placed This Encounter  Procedures   XR Finger Middle Left   No orders of the defined types were placed in this encounter.     Procedures: No procedures performed   Clinical Data: No additional findings.   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  She said that when she was a child she was helping her mom cut vegetables and cut the volar side of her finger.  Since then she has had trouble flexing the finger.  Also has noticed some increasing pain in the last couple years over the finger itself over what she points to is the middle phalanx  Review of Systems  All other systems reviewed and are negative.    Objective: Vital Signs: There  were no vitals taken for this visit.  Physical Exam Skin:    General: Skin is warm and dry.  Neurological:     General: No focal deficit present.     Mental Status: She is alert and oriented to person, place, and time.  Psychiatric:        Mood and Affect: Mood normal.        Behavior: Behavior normal.     Ortho Exam Examination she has brisk capillary refill she has no swelling no erythema no signs of cellulitis.  She does have some tenderness over the proximal end of the middle phalanx.  Also has noted extensor tendon injury at the DIP.  She does have some tenderness over the proximal end of the middle phalanx but no evidence of an infective process she has strong pulses brisk capillary refill joint Specialty Comments:  No specialty comments available.  Imaging: XR Finger Middle Left Result Date: 06/09/2024 Radiographs of her middle left finger demonstrate no acute injuries.  She does have a findings at the DIP joint consistent with an flexor tendon injury.  Also cystic change within the middle phalanx.  Does not look acute    PMFS History: Patient  Active Problem List   Diagnosis Date Noted   Panniculitis 11/18/2023   Paresthesia 11/09/2020   Sensation of fullness in ear 10/31/2020   Seasonal and perennial allergic rhinoconjunctivitis 10/31/2020   Anemia, chronic disease 08/16/2020   Influenza vaccination declined 07/10/2020   Abdominal obesity 07/10/2020   Obesity (BMI 30.0-34.9) 07/10/2020   Adverse food reaction 05/05/2020   Other allergic rhinitis 05/05/2020   Prediabetes 04/11/2020   Reactive depression 02/21/2020   Class 2 obesity due to excess calories without serious comorbidity with body mass index (BMI) of 35.0 to 35.9 in adult 02/21/2020   Drug reaction 03/03/2017   Frequent headaches 11/15/2016   Palpitations 11/15/2016   Family history of thyroid  disease in sister 11/15/2016   Post-term pregnancy 08/09/2014   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    Gastroesophageal reflux disease 05/27/2013   Past Medical History:  Diagnosis Date   Allergy     RHINITIS   Eczema    GERD (gastroesophageal reflux disease)    Neck pain    Palpitations    Urticaria     Family History  Problem Relation Age of Onset   Diabetes Mother    Hypertension Mother    Other Father        unsure of medical history   Thyroid  disease Sister        goiter   Diabetes Sister    Other Brother        murdered   Asthma Maternal Uncle    Asthma Daughter     Past Surgical History:  Procedure Laterality Date   BREAST REDUCTION SURGERY Bilateral 11/21/2020   Procedure: MAMMARY REDUCTION  (BREAST);  Surgeon: Elisabeth Craig RAMAN, MD;  Location: [facility];  Service: Plastics;  Laterality: Bilateral;  2 hours   NO PAST SURGERIES     VAGINAL DELIVERY     X 4   Social History   Occupational History   Occupation: Homemaker  Tobacco Use   Smoking status: Never   Smokeless tobacco: Never  Vaping Use   Vaping status: Never Used  Substance and Sexual Activity   Alcohol use: No   Drug use: No   Sexual activity: Yes    Comment: last week

## 2024-06-16 ENCOUNTER — Emergency Department (HOSPITAL_COMMUNITY)

## 2024-06-16 ENCOUNTER — Emergency Department (HOSPITAL_COMMUNITY): Admitting: Anesthesiology

## 2024-06-16 ENCOUNTER — Encounter (HOSPITAL_COMMUNITY)
Admission: EM | Disposition: A | Discharge status: Home / Self Care | Payer: Self-pay | Source: Home / Self Care | Attending: Emergency Medicine

## 2024-06-16 ENCOUNTER — Encounter (HOSPITAL_COMMUNITY): Payer: Self-pay | Admitting: *Deleted

## 2024-06-16 ENCOUNTER — Other Ambulatory Visit: Payer: Self-pay

## 2024-06-16 ENCOUNTER — Emergency Department (HOSPITAL_COMMUNITY): Admission: EM | Admit: 2024-06-16 | Discharge: 2024-06-17 | Disposition: A | Discharge status: Home / Self Care

## 2024-06-16 DIAGNOSIS — T17208A Unspecified foreign body in pharynx causing other injury, initial encounter: Secondary | ICD-10-CM

## 2024-06-16 DIAGNOSIS — T189XXA Foreign body of alimentary tract, part unspecified, initial encounter: Secondary | ICD-10-CM | POA: Insufficient documentation

## 2024-06-16 DIAGNOSIS — K5730 Diverticulosis of large intestine without perforation or abscess without bleeding: Secondary | ICD-10-CM | POA: Diagnosis not present

## 2024-06-16 DIAGNOSIS — W44C1XA Sharp glass entering into or through a natural orifice, initial encounter: Secondary | ICD-10-CM

## 2024-06-16 DIAGNOSIS — W44C0XA Glass unspecified, entering into or through a natural orifice, initial encounter: Secondary | ICD-10-CM | POA: Diagnosis not present

## 2024-06-16 DIAGNOSIS — T182XXA Foreign body in stomach, initial encounter: Secondary | ICD-10-CM

## 2024-06-16 DIAGNOSIS — R1310 Dysphagia, unspecified: Secondary | ICD-10-CM | POA: Diagnosis not present

## 2024-06-16 DIAGNOSIS — R911 Solitary pulmonary nodule: Secondary | ICD-10-CM | POA: Insufficient documentation

## 2024-06-16 HISTORY — PX: ESOPHAGOGASTRODUODENOSCOPY: SHX5428

## 2024-06-16 SURGERY — EGD (ESOPHAGOGASTRODUODENOSCOPY)
Anesthesia: General

## 2024-06-16 MED ORDER — PHENYLEPHRINE 80 MCG/ML (10ML) SYRINGE FOR IV PUSH (FOR BLOOD PRESSURE SUPPORT)
PREFILLED_SYRINGE | INTRAVENOUS | Status: DC | PRN
  Administered 2024-06-16: 80 ug via INTRAVENOUS
  Administered 2024-06-16: 160 ug via INTRAVENOUS
  Administered 2024-06-16: 80 ug via INTRAVENOUS
  Administered 2024-06-16: 160 ug via INTRAVENOUS

## 2024-06-16 MED ORDER — ONDANSETRON HCL 4 MG/2ML IJ SOLN
INTRAMUSCULAR | Status: DC | PRN
  Administered 2024-06-16: 4 mg via INTRAVENOUS

## 2024-06-16 MED ORDER — DEXAMETHASONE SOD PHOSPHATE PF 10 MG/ML IJ SOLN
INTRAMUSCULAR | Status: DC | PRN
  Administered 2024-06-16: 10 mg via INTRAVENOUS

## 2024-06-16 MED ORDER — SODIUM CHLORIDE 0.9 % IV SOLN: INTRAVENOUS | Status: DC | PRN

## 2024-06-16 MED ORDER — SUCCINYLCHOLINE CHLORIDE 200 MG/10ML IV SOSY
PREFILLED_SYRINGE | INTRAVENOUS | Status: DC | PRN
  Administered 2024-06-16: 100 mg via INTRAVENOUS

## 2024-06-16 MED ORDER — FENTANYL CITRATE (PF) 100 MCG/2ML IJ SOLN
INTRAMUSCULAR | Status: DC | PRN
  Administered 2024-06-16 (×2): 50 ug via INTRAVENOUS

## 2024-06-16 MED ORDER — MIDAZOLAM HCL 2 MG/2ML IJ SOLN
INTRAMUSCULAR | Status: AC
  Filled 2024-06-16: qty 2

## 2024-06-16 MED ORDER — FENTANYL CITRATE (PF) 100 MCG/2ML IJ SOLN
INTRAMUSCULAR | Status: AC
  Filled 2024-06-16: qty 2

## 2024-06-16 MED ORDER — EPHEDRINE SULFATE-NACL 50-0.9 MG/10ML-% IV SOSY
PREFILLED_SYRINGE | INTRAVENOUS | Status: DC | PRN
  Administered 2024-06-16 (×4): 5 mg via INTRAVENOUS

## 2024-06-16 MED ORDER — PROPOFOL 10 MG/ML IV BOLUS
INTRAVENOUS | Status: DC | PRN
  Administered 2024-06-16: 140 mg via INTRAVENOUS

## 2024-06-16 MED ORDER — LIDOCAINE 2% (20 MG/ML) 5 ML SYRINGE
INTRAMUSCULAR | Status: DC | PRN
  Administered 2024-06-16: 60 mg via INTRAVENOUS

## 2024-06-16 MED ORDER — MIDAZOLAM HCL 2 MG/2ML IJ SOLN
INTRAMUSCULAR | Status: DC | PRN
  Administered 2024-06-16: 2 mg via INTRAVENOUS

## 2024-06-16 NOTE — ED Notes (Signed)
 Patient transport to Endoscopy
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
## 2024-06-16 NOTE — Transfer of Care (Signed)
 Immediate Anesthesia Transfer of Care Note  Patient: Denise Arias  Procedure(s) Performed: EGD (ESOPHAGOGASTRODUODENOSCOPY)  Patient Location: PACU  Anesthesia Type:General  Level of Consciousness: drowsy  Airway & Oxygen Therapy: Patient Spontanous Breathing and Patient connected to nasal cannula oxygen  Post-op Assessment: Report given to RN and Post -op Vital signs reviewed and stable  Post vital signs: Reviewed and stable  Last Vitals:  Vitals Value Taken Time  BP 120/66 06/16/24 23:34  Temp 37.4 C 06/16/24 23:34  Pulse 96 06/16/24 23:36  Resp 14 06/16/24 23:36  SpO2 100 % 06/16/24 23:36  Vitals shown include unfiled device data.  Last Pain:  Vitals:   06/16/24 2213  TempSrc: Temporal  PainSc: 0-No pain         Complications: No notable events documented.
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
## 2024-06-16 NOTE — Anesthesia Preprocedure Evaluation (Addendum)
 Anesthesia Evaluation  Patient identified by MRN, date of birth, ID band Patient awake    Reviewed: Allergy  & Precautions, H&P , NPO status , Patient's Chart, lab work & pertinent test results  Airway Mallampati: II  TM Distance: >3 FB Neck ROM: Full    Dental no notable dental hx. (+) Dental Advisory Given, Teeth Intact   Pulmonary neg pulmonary ROS   Pulmonary exam normal breath sounds clear to auscultation       Cardiovascular negative cardio ROS  Rhythm:Regular Rate:Normal + Systolic murmurs    Neuro/Psych  Headaches PSYCHIATRIC DISORDERS  Depression       GI/Hepatic Neg liver ROS,GERD  ,,  Endo/Other  negative endocrine ROS    Renal/GU negative Renal ROS     Musculoskeletal   Abdominal  (+) + obese  Peds  Hematology  (+) Blood dyscrasia, anemia   Anesthesia Other Findings   Reproductive/Obstetrics                              Anesthesia Physical Anesthesia Plan  ASA: 2 and emergent  Anesthesia Plan: General   Post-op Pain Management:    Induction: Intravenous, Rapid sequence and Cricoid pressure planned  PONV Risk Score and Plan: 3 and Ondansetron , Dexamethasone , Midazolam  and Treatment may vary due to age or medical condition  Airway Management Planned: Oral ETT  Additional Equipment:   Intra-op Plan:   Post-operative Plan: Extubation in OR  Informed Consent: I have reviewed the patients History and Physical, chart, labs and discussed the procedure including the risks, benefits and alternatives for the proposed anesthesia with the patient or authorized representative who has indicated his/her understanding and acceptance.     Dental advisory given  Plan Discussed with: CRNA  Anesthesia Plan Comments:          Anesthesia Quick Evaluation

## 2024-06-16 NOTE — Consult Note (Addendum)
 Inpatient Consultation   Referring Provider:      Primary Care Physician:  Vicci Barnie NOVAK, MD Primary Gastroenterologist:        None Reason for Consultation:     Swallowed foreign body         HPI  Denise Arias is a 43 y.o. female with a past medical history noteworthy for obesity on Mounjaro , prediabetes, anemia and GERD who was seen in the emergency department for evaluation and management of swallowed foreign body.  Patient reports that she was attempting to open the cap of a glass bottle with her teeth at 3:00 PM today.  When doing so she chipped a piece of glass off the top of the rim of the bottle and swallowed it when drinking a sip of the contents.  She subsequently noted pain and discomfort over the left side of her neck.  Has a sensation of object being stuck in her neck.  Has been spitting secretions into an emesis bag.  States that initially it was difficult for her to speak but now she is finding it easier to use her voice.  Stable from a respiratory standpoint.  No nausea, vomiting, chest pain, abdominal pain.  In the emergency department: Patient is alert and hemodynamically stable No respiratory compromise  X-ray of the soft tissues of the neck shows a 3 mm round hyperdensity projecting over the retropharyngeal soft tissues just below the hyoid bone suspicious for a small foreign body.  No retropharyngeal soft tissue swelling or gas.  Ms. Ow reports that she last ate solid food at 11:00 this morning. Does take Mounjaro  and has taken a dose within the last week.  Past Medical History:  Diagnosis Date   Allergy     RHINITIS   Eczema    GERD (gastroesophageal reflux disease)    Neck pain    Palpitations    Urticaria     Past Surgical History:  Procedure Laterality Date   BREAST REDUCTION SURGERY Bilateral 11/21/2020   Procedure: MAMMARY REDUCTION  (BREAST);  Surgeon: Elisabeth Craig RAMAN, MD;  Location: [facility];  Service: Plastics;   Laterality: Bilateral;  2 hours   NO PAST SURGERIES     VAGINAL DELIVERY     X 4    Family History  Problem Relation Age of Onset   Diabetes Mother    Hypertension Mother    Other Father        unsure of medical history   Thyroid  disease Sister        goiter   Diabetes Sister    Other Brother        murdered   Asthma Maternal Uncle    Asthma Daughter      Social History   Tobacco Use   Smoking status: Never   Smokeless tobacco: Never  Vaping Use   Vaping status: Never Used  Substance Use Topics   Alcohol use: No   Drug use: No    Prior to Admission medications   Medication Sig Start Date End Date Taking? Authorizing Provider  Azelastine  HCl 137 MCG/SPRAY SOLN Place 1 spray into both nostrils 2 (two) times daily. 04/03/24   [provider]  diphenhydrAMINE  (BENADRYL ) 25 MG tablet Take 1 tablet (25 mg total) by mouth every 6 (six) hours as needed. 01/12/24   Barrett, Warren SAILOR, PA-C  hydrOXYzine  (ATARAX ) 25 MG tablet Take 1 tablet (25 mg total) by mouth every 6 (six) hours. 01/12/24   Barrett, Jamie  N, PA-C  levocetirizine (XYZAL ) 5 MG tablet Take 1 tablet (5 mg total) by mouth every evening. 11/20/23   Iva Marty Saltness, MD  omeprazole  (PRILOSEC) 20 MG capsule Take 1 capsule (20 mg total) by mouth daily. 05/27/24   Vicci Barnie NOVAK, MD  tirzepatide  (MOUNJARO ) 5 MG/0.5ML Pen Inject 5 mg into the skin once a week. 05/29/24   Vicci Barnie NOVAK, MD  triamcinolone  ointment (KENALOG ) 0.1 % Apply 1 Application topically 2 (two) times daily as needed. 11/11/23   Iva Marty Saltness, MD    No current facility-administered medications for this encounter.   Current Outpatient Medications  Medication Sig Dispense Refill   Azelastine  HCl 137 MCG/SPRAY SOLN Place 1 spray into both nostrils 2 (two) times daily.     diphenhydrAMINE  (BENADRYL ) 25 MG tablet Take 1 tablet (25 mg total) by mouth every 6 (six) hours as needed. 30 tablet 0   hydrOXYzine  (ATARAX ) 25 MG tablet Take 1  tablet (25 mg total) by mouth every 6 (six) hours. 12 tablet 0   levocetirizine (XYZAL ) 5 MG tablet Take 1 tablet (5 mg total) by mouth every evening. 90 tablet 1   omeprazole  (PRILOSEC) 20 MG capsule Take 1 capsule (20 mg total) by mouth daily. 30 capsule 3   tirzepatide  (MOUNJARO ) 5 MG/0.5ML Pen Inject 5 mg into the skin once a week. 6 mL 1   triamcinolone  ointment (KENALOG ) 0.1 % Apply 1 Application topically 2 (two) times daily as needed. 454 g 0    Allergies as of 06/16/2024 - Review Complete 06/16/2024  Allergen Reaction Noted   Molds & smuts Anaphylaxis 06/13/2020   Amoxicillin  Nausea And Vomiting 06/04/2016   Banana Hives 01/16/2011   Fish allergy  Other (See Comments) 01/10/2021   Fish oil Rash 01/16/2011   Minocycline Rash and Hives 05/04/2020   Multivitamins Other (See Comments) 07/05/2015   Strawberry extract Rash and Other (See Comments) 01/16/2011   Tizanidine  Other (See Comments) 07/10/2020   Tramadol  Other (See Comments) 07/10/2020   Vitamin c Rash 11/09/2020   Banana extract allergy  skin test Other (See Comments) 06/18/2022   Other Itching 05/04/2020   Fish oil Rash 01/16/2011    GI Review of Symptoms Significant for pain when swallowing and difficulty tolerating secretions. Otherwise negative.  General Review of Systems  Review of systems is significant for the pertinent positives and negatives as listed per the HPI.  Full ROS is otherwise negative.    Physical Exam  Vital signs in last 24 hours: Temp:  [98.1 F (36.7 C)-99.1 F (37.3 C)] 99.1 F (37.3 C) (10/15 1923) Pulse Rate:  [63-67] 67 (10/15 1923) Resp:  [18] 18 (10/15 1923) BP: (110-120)/(68-79) 110/68 (10/15 1923) SpO2:  [99 %-100 %] 99 % (10/15 1923) Weight:  [86.2 kg] 86.2 kg (10/15 1625)   General:  NAD, Well developed, Well nourished, alert and cooperative Head:  Normocephalic and atraumatic. Eyes:   PEERL, EOMI. No icterus. Conjunctiva pink. Neck: Minimal tenderness to palpation over the  left aspect of neck Throat: Oral cavity and pharynx without inflammation, swelling or lesion. Teeth in good condition. Lungs: Respirations even and unlabored. Lungs clear to auscultation bilaterally.   No wheezes, crackles, or rhonchi.  Heart: Normal S1, S2. No MRG. Regular rate and rhythm. No peripheral edema, cyanosis or pallor.  Abdomen:  Soft, nondistended, nontender. No rebound or guarding. Normal bowel sounds. No appreciable masses or hepatomegaly. Msk:  Symmetrical without gross deformities. Peripheral pulses intact.  Extremities:  Without edema, no deformity or joint  abnormality. Normal ROM, normal   Lab Results No results for input(s): WBC, HGB, HCT, PLT in the last 72 hours. BMET No results for input(s): NA, K, CL, CO2, GLUCOSE, BUN, CREATININE, CALCIUM in the last 72 hours. LFT No results for input(s): PROT, ALBUMIN, AST, ALT, ALKPHOS, BILITOT, BILIDIR, IBILI in the last 72 hours. PT/INR No results for input(s): LABPROT, INR in the last 72 hours.  Radiographic Studies DG Neck Soft Tissue Result Date: 06/16/2024 EXAM: 1 VIEW(S) XRAY OF THE SOFT TISSUE NECK 06/16/2024 05:12:00 PM COMPARISON: None available. CLINICAL HISTORY: foreign body - glass. Reason for exam: foreign body - glass foreign body - glass FINDINGS: SOFT TISSUES: 3 mm round hyperdensity projecting over the retropharyngeal soft tissues just below the hyoid bone suspicious for small foreign body. No retropharyngeal soft tissue swelling or gas. EPIGLOTTIS: No epiglottic thickening. BONES: No acute osseous abnormality. IMPRESSION: 1. 3 mm round hyperdensity over the retropharyngeal soft tissues just below the hyoid bone, suspicious for a small foreign body. Electronically signed by: Norman Gatlin MD 06/16/2024 05:33 PM EDT RP Workstation: [id number]    Endoscopic Studies     None   Clinical Impression    Ms. Heinrich is a 43 year old female who ingested a small piece of glass  at 3:00 PM today when attempting to open a glass bottle using her teeth.  She has been experiencing discomfort over her left neck and difficulty tolerating secretions.  X-ray of the neck soft tissues shows a 3 mm round hyperdensity over the retropharyngeal soft tissues below the hyoid bone suspicious for a small foreign body.   I discussed with Ms. Mirza and her spouse the need for removal of the foreign body given the sharp nature of glass.  Advised that we would attempt to remove remove the foreign body with a flexible endoscope.  Reviewed potential risks of anesthesia, aspiration, perforation and bleeding.  Also counseled patient and her husband that the location of the foreign body may not be in the esophagus and may be proximal in the head neck region that could necessitate otolaryngology evaluation.  If we are unsuccessful at locating or removing the piece of glass may need to involve otolaryngology colleagues.   Plan  Plan for endoscopic attempt at removal of foreign body.  Patient will need to be intubated for procedure. Maintain n.p.o. If foreign body cannot be located or removed with flexible endoscope may need to involve otolaryngology colleagues with use of rigid scope. Disposition will depend upon outcome of endoscopic procedure.  Patient will need to be clinically stable and able to tolerate oral intake without difficulty in order to meet discharge goals.  Thank you for your kind consultation, we will continue to follow.  Inocente HERO Makayla Lanter  06/16/2024, 9:13 PM  Inocente Hausen, MD [facility]enter[facility]

## 2024-06-16 NOTE — Op Note (Signed)
 [facility] Patient Name: Denise Arias Procedure Date : 06/16/2024 MRN: [id number] Attending MD: Inocente Hausen , MD, [id number] Date of Birth: June 12, 1981 CSN: [id number] Age: 43 Admit Type: Emergency Department Procedure:                Upper GI endoscopy Indications:              Dysphagia, Odynophagia, Foreign body in the GI                            tract -swallowed shard of glass with suspected                            foreign body over retropharyngeal soft tissues on                            soft tissue x-ray of neck Providers:                Inocente Hausen, MD, Ritta Debbie Alert, RN,                            [facility] [place], Technician Referring MD:              Medicines:                General Anesthesia Complications:            No immediate complications. Estimated blood loss:                            None. Estimated Blood Loss:     Estimated blood loss: none. Procedure:                Pre-Anesthesia Assessment:                           - Prior to the procedure, a History and Physical                            was performed, and patient medications and                            allergies were reviewed. The patient's tolerance of                            previous anesthesia was also reviewed. The risks                            and benefits of the procedure and the sedation                            options and risks were discussed with the patient.                            All questions were answered, and informed consent  was obtained. Prior Anticoagulants: The patient has                            taken no anticoagulant or antiplatelet agents. ASA                            Grade Assessment: II - A patient with mild systemic                            disease. After reviewing the risks and benefits,                            the patient was deemed in satisfactory condition to                             undergo the procedure.                           After obtaining informed consent, the endoscope was                            passed under direct vision. Throughout the                            procedure, the patient's blood pressure, pulse, and                            oxygen saturations were monitored continuously. The                            [id number]-[id number] ([id number]) Olympus endoscope was introduced                            through the mouth, and advanced to the antrum of                            the stomach. The upper GI endoscopy was                            accomplished without difficulty. The patient                            tolerated the procedure well. Scope In: Scope Out: Findings:      Limited views of the hypopharynx were normal      The examined esophagus was normal. The endoscope was carefully       maneuvered down the esophagus without definite evidence of foreign body.       The scope was withdrawn and examination of the esophagus was repeated 3       times without the presence of a foreign body identified.      A large amount of food (residue) was found in the gastric fundus, in the       gastric body and in the gastric antrum. The stomach could not be       adequately  visualized due to a large amount of retained food residue in       the stomach. Impression:               - Normal esophagus. No evidence of esophageal                            foreign body on flexible upper endoscopy.                           - A large amount of food (residue) in the stomach.                           - No specimens collected. Recommendation:           - Return patient to emergency room after PACU                            recovery                           - I spoke with Dr. Neysa in the emergency                            department and recommended performing a CT scan of                            the neck, chest and abdomen to try to localize                             presence of a foreign body. If foreign body is                            present in the proximal neck region may require ENT                            evaluation with rigid scope. It is also possible                            that the foreign body has moved since initial soft                            tissue of neck was performed. The patient's                            symptoms of discomfort of her left neck could                            reflect injury from foreign body that had moved. If                            shard of glass has digested into the stomach will  be difficult to localize and extract due to large                            amount of retained food residue in the patient's                            stomach (on GLP 1 medication).                           - Maintain patient n.p.o.                           - Monitor for any signs or symptoms of perforation.                           - Results of endoscopy exam discussed with                            patient's spouse. Procedure Code(s):        --- Professional ---                           [phone], 52, Esophagogastroduodenoscopy, flexible,                            transoral; diagnostic, including collection of                            specimen(s) by brushing or washing, when performed                            (separate procedure) Diagnosis Code(s):        --- Professional ---                           R13.10, Dysphagia, unspecified                           T18.9XXA, Foreign body of alimentary tract, part                            unspecified, initial encounter CPT copyright 2022 [employer]. All rights reserved. The codes documented in this report are preliminary and upon coder review may  be revised to meet current compliance requirements. Inocente Hausen, MD 06/16/2024 11:43:54 PM This report has been signed electronically. Number of Addenda: 0

## 2024-06-16 NOTE — Anesthesia Procedure Notes (Signed)
 Procedure Name: Intubation Date/Time: 06/16/2024 10:50 PM  Performed by: Roddie Grate, CRNAPre-anesthesia Checklist: Patient identified, Emergency Drugs available, Suction available, Patient being monitored and Timeout performed Patient Re-evaluated:Patient Re-evaluated prior to induction Oxygen Delivery Method: Circle system utilized Preoxygenation: Pre-oxygenation with 100% oxygen Induction Type: IV induction, Rapid sequence and Cricoid Pressure applied Laryngoscope Size: Mac and 3 Grade View: Grade I Tube type: Oral Tube size: 7.0 mm Number of attempts: 1 Airway Equipment and Method: Stylet Placement Confirmation: ETT inserted through vocal cords under direct vision, positive ETCO2 and breath sounds checked- equal and bilateral Secured at: 23 cm Tube secured with: Tape Dental Injury: Teeth and Oropharynx as per pre-operative assessment  Comments: Smooth IV Induction. Eyes taped. RSI Performed. DL x 1 with grade 1 view. Atraumatically placed, teeth and lip remain intact as pre-op. Secured with tape. Bilateral breath sounds +/=, EtCO2 +, Adequate TV, VSS.

## 2024-06-16 NOTE — ED Provider Notes (Signed)
 [facility] ENDOSCOPY Provider Note   CSN: [id number] Arrival date & time: 06/16/24  1549     Patient presents with: Glass Stuck in throat   Denise Arias is a 43 y.o. female.   This is a 43 year old female presenting emergency department with foreign object in throat.  3 PM this afternoon and opened a glass soda bottle with her mouth, chipped glass and swallowed has had pain since that time.  Has not had anything else to eat or drink.  Painful to swallow, but able to do so.  Not short of breath.  No abdominal pain.        Prior to Admission medications   Medication Sig Start Date End Date Taking? Authorizing Provider  Azelastine  HCl 137 MCG/SPRAY SOLN Place 1 spray into both nostrils 2 (two) times daily. 04/03/24   [provider]  diphenhydrAMINE  (BENADRYL ) 25 MG tablet Take 1 tablet (25 mg total) by mouth every 6 (six) hours as needed. 01/12/24   Barrett, Jamie N, PA-C  hydrOXYzine  (ATARAX ) 25 MG tablet Take 1 tablet (25 mg total) by mouth every 6 (six) hours. 01/12/24   Barrett, Jamie N, PA-C  levocetirizine (XYZAL ) 5 MG tablet Take 1 tablet (5 mg total) by mouth every evening. 11/20/23   Iva Marty Saltness, MD  omeprazole  (PRILOSEC) 20 MG capsule Take 1 capsule (20 mg total) by mouth daily. 05/27/24   Vicci Barnie NOVAK, MD  tirzepatide  (MOUNJARO ) 5 MG/0.5ML Pen Inject 5 mg into the skin once a week. 05/29/24   Vicci Barnie NOVAK, MD  triamcinolone  ointment (KENALOG ) 0.1 % Apply 1 Application topically 2 (two) times daily as needed. 11/11/23   Iva Marty Saltness, MD    Allergies: Molds & smuts, Amoxicillin , Banana, Fish allergy , Fish oil, Minocycline, Multivitamins, Strawberry extract, Tizanidine , Tramadol , Vitamin c, Banana extract allergy  skin test, Other, and Fish oil    Review of Systems  Updated Vital Signs BP 115/74   Pulse 78   Temp (!) 97.1 F (36.2 C) (Temporal)   Resp (!) 21   Ht 5' 4 (1.626 m)   Wt 86.2 kg   LMP 06/02/2024   SpO2  99%   BMI 32.62 kg/m   Physical Exam Vitals and nursing note reviewed.  Constitutional:      General: She is not in acute distress.    Appearance: She is not toxic-appearing.  HENT:     Head: Normocephalic.     Nose: Nose normal.     Mouth/Throat:     Mouth: Mucous membranes are moist.  Eyes:     Conjunctiva/sclera: Conjunctivae normal.  Cardiovascular:     Rate and Rhythm: Normal rate and regular rhythm.  Pulmonary:     Effort: Pulmonary effort is normal.     Breath sounds: Normal breath sounds.  Abdominal:     General: Abdomen is flat. There is no distension.     Tenderness: There is no abdominal tenderness. There is no guarding or rebound.  Skin:    General: Skin is warm and dry.     Capillary Refill: Capillary refill takes less than 2 seconds.  Neurological:     Mental Status: She is alert and oriented to person, place, and time.  Psychiatric:        Mood and Affect: Mood normal.        Behavior: Behavior normal.     (all labs ordered are listed, but only abnormal results are displayed) Labs Reviewed - No data to display  EKG:  None  Radiology: DG Neck Soft Tissue Result Date: 06/16/2024 EXAM: 1 VIEW(S) XRAY OF THE SOFT TISSUE NECK 06/16/2024 05:12:00 PM COMPARISON: None available. CLINICAL HISTORY: foreign body - glass. Reason for exam: foreign body - glass foreign body - glass FINDINGS: SOFT TISSUES: 3 mm round hyperdensity projecting over the retropharyngeal soft tissues just below the hyoid bone suspicious for small foreign body. No retropharyngeal soft tissue swelling or gas. EPIGLOTTIS: No epiglottic thickening. BONES: No acute osseous abnormality. IMPRESSION: 1. 3 mm round hyperdensity over the retropharyngeal soft tissues just below the hyoid bone, suspicious for a small foreign body. Electronically signed by: Norman Gatlin MD 06/16/2024 05:33 PM EDT RP Workstation: [id number]     Procedures   Medications Ordered in the ED  midazolam  (VERSED ) 2 MG/2ML  injection (  Override pull for Anesthesia 06/16/24 2258)  fentaNYL  (SUBLIMAZE ) 100 MCG/2ML injection (  Override pull for Anesthesia 06/16/24 2258)    Clinical Course as of 06/16/24 2326  Wed Jun 16, 2024  2013 Spoke with GI who will review images and call back regarding treatment options. [TY]  2106 10:30 tonight with GI.  [TY]  2326 Received phone call from GI updating that no foreign body obviously appreciated.  Patient going to PACU currently, but will return back to the ED.  GI is recommending CT scan to further evaluate. [TY]    Clinical Course User Index [TY] Neysa Caron PARAS, DO                                 Medical Decision Making This is a 44 year old female presenting emergency department for foreign body in throat.  Piece of glass broke off from a bottle that she opened with her mouth.  Does not appear to be in distress maintaining her secretions.  Stable vitals.  X-ray with foreign body.  Case discussed with GI taken to endoscopy suite for possible removal, but may need ENT.  Risk Decision regarding hospitalization.       Final diagnoses:  None    ED Discharge Orders     None          Neysa Caron PARAS, DO 06/16/24 2319

## 2024-06-16 NOTE — ED Notes (Signed)
 Pt and family asked to speak to RN, quick triage RN notified and speaking with pt about concerns.

## 2024-06-16 NOTE — ED Provider Triage Note (Signed)
 Emergency Medicine Provider Triage Evaluation Note  Denise Arias , a 43 y.o. female  was evaluated in triage.  Pt complains of swallowed FB.  Review of Systems  Positive: Painful swallowing FB sensation Negative: bleeding  Physical Exam  BP 120/79 (BP Location: Right Arm)   Pulse 63   Temp 98.1 F (36.7 C)   Resp 18   Ht 5' 4 (1.626 m)   Wt 86.2 kg   LMP 06/02/2024   SpO2 100%   BMI 32.62 kg/m  Gen:   Awake, no distress   Resp:  Normal effort  MSK:   Moves extremities without difficulty  Other:  Oropharynx benign  Medical Decision Making  Medically screening exam initiated at 4:26 PM.  Appropriate orders placed.  Denise Arias was informed that the remainder of the evaluation will be completed by another provider, this initial triage assessment does not replace that evaluation, and the importance of remaining in the ED until their evaluation is complete.  A piece of glass from a drink bottle broke while she was drinking and she feels it lodged in her throat.    Odell Balls, PA-C 06/16/24 1627

## 2024-06-17 ENCOUNTER — Emergency Department (HOSPITAL_COMMUNITY)

## 2024-06-17 ENCOUNTER — Encounter (HOSPITAL_COMMUNITY): Payer: Self-pay | Admitting: Pediatrics

## 2024-06-17 DIAGNOSIS — T189XXA Foreign body of alimentary tract, part unspecified, initial encounter: Secondary | ICD-10-CM

## 2024-06-17 LAB — I-STAT CHEM 8, ED
BUN: 16 mg/dL (ref 6–20)
Calcium, Ion: 1.17 mmol/L (ref 1.15–1.40)
Chloride: 104 mmol/L (ref 98–111)
Creatinine, Ser: 0.5 mg/dL (ref 0.44–1.00)
Glucose, Bld: 101 mg/dL — ABNORMAL HIGH (ref 70–99)
HCT: 38 % (ref 36.0–46.0)
Hemoglobin: 12.9 g/dL (ref 12.0–15.0)
Potassium: 3.7 mmol/L (ref 3.5–5.1)
Sodium: 141 mmol/L (ref 135–145)
TCO2: 25 mmol/L (ref 22–32)

## 2024-06-17 LAB — COMPREHENSIVE METABOLIC PANEL WITH GFR
ALT: 15 U/L (ref 0–44)
AST: 17 U/L (ref 15–41)
Albumin: 3.6 g/dL (ref 3.5–5.0)
Alkaline Phosphatase: 53 U/L (ref 38–126)
Anion gap: 9 (ref 5–15)
BUN: 15 mg/dL (ref 6–20)
CO2: 22 mmol/L (ref 22–32)
Calcium: 8.4 mg/dL — ABNORMAL LOW (ref 8.9–10.3)
Chloride: 107 mmol/L (ref 98–111)
Creatinine, Ser: 0.58 mg/dL (ref 0.44–1.00)
GFR, Estimated: >60 mL/min (ref >60)
Glucose, Bld: 105 mg/dL — ABNORMAL HIGH (ref 70–99)
Potassium: 3.8 mmol/L (ref 3.5–5.1)
Sodium: 138 mmol/L (ref 135–145)
Total Bilirubin: 0.5 mg/dL (ref 0.0–1.2)
Total Protein: 7 g/dL (ref 6.5–8.1)

## 2024-06-17 LAB — CBC
HCT: 36.6 % (ref 36.0–46.0)
Hemoglobin: 12.2 g/dL (ref 12.0–15.0)
MCH: 29.1 pg (ref 26.0–34.0)
MCHC: 33.3 g/dL (ref 30.0–36.0)
MCV: 87.4 fL (ref 80.0–100.0)
Platelets: 205 K/uL (ref 150–400)
RBC: 4.19 MIL/uL (ref 3.87–5.11)
RDW: 13.5 % (ref 11.5–15.5)
WBC: 9.5 K/uL (ref 4.0–10.5)
nRBC: 0 % (ref 0.0–0.2)

## 2024-06-17 LAB — HCG, SERUM, QUALITATIVE: Preg, Serum: NEGATIVE

## 2024-06-17 MED ORDER — IOHEXOL 350 MG/ML SOLN
100.0000 mL | Freq: Once | INTRAVENOUS | Status: AC | PRN
  Administered 2024-06-17: 100 mL via INTRAVENOUS

## 2024-06-17 NOTE — Anesthesia Postprocedure Evaluation (Signed)
 Anesthesia Post Note  Patient: Denise Arias  Procedure(s) Performed: EGD (ESOPHAGOGASTRODUODENOSCOPY)     Patient location during evaluation: PACU Anesthesia Type: General Level of consciousness: sedated and patient cooperative Pain management: pain level controlled Vital Signs Assessment: post-procedure vital signs reviewed and stable Respiratory status: spontaneous breathing Cardiovascular status: stable Anesthetic complications: no   No notable events documented.  Last Vitals:  Vitals:   06/17/24 0418 06/17/24 0430  BP: 100/70 102/61  Pulse: 87 70  Resp: 14 16  Temp:  36.6 C  SpO2: 98% 96%    Last Pain:  Vitals:   06/17/24 0502  TempSrc:   PainSc: 0-No pain                 Norleen Pope

## 2024-06-17 NOTE — Discharge Instructions (Signed)
 You were evaluated in the Emergency Department and after careful evaluation, we did not find any emergent condition requiring admission or further testing in the hospital.  Your exam/testing today is overall reassuring.  You should be able to pass the foreign body in your stool over the next few days.  Recommend skipping a dose of your Mounjaro  so that this medicine does not slow down this process.  Please return to the Emergency Department if you experience any worsening of your condition.   Thank you for allowing us  to be a part of your care.

## 2024-06-17 NOTE — ED Notes (Signed)
 Pts daughter was contacted to pick patient up

## 2024-06-17 NOTE — ED Provider Notes (Signed)
  Provider Note MRN:  [id number]  Arrival date & time: 06/17/24    ED Course and Medical Decision Making  Assumed care of patient at sign-out or upon transfer.  Patient accidentally swallowed a piece of glass after trying to open a glass bottle with her teeth.  Was evaluated with endoscopy with GI, they could not find the glass fragment.  Sent back to the emergency department for repeat evaluation, labs, CT imaging.  4:50 AM update: CT imaging shows no evidence of foreign body in the neck or pharynx as was previously the concern.  Patient was having some pain in the left lower anterior neck but this has since resolved.  CT chest revealing the fragment is now in the stomach.  Patient has been comfortable without complaints over the past 3 hours.  I discussed the case with Dr. Suzann of [facility] gastroenterology, given the small size patient is appropriate for discharge, likely will pass in the stool.  Given strict return precautions for abdominal pain.  Procedures  Final Clinical Impressions(s) / ED Diagnoses     ICD-10-CM   1. Swallowed foreign body, initial encounter  [email or web address]       ED Discharge Orders     None         Discharge Instructions      You were evaluated in the Emergency Department and after careful evaluation, we did not find any emergent condition requiring admission or further testing in the hospital.  Your exam/testing today is overall reassuring.  You should be able to pass the foreign body in your stool over the next few days.  Recommend skipping a dose of your Mounjaro  so that this medicine does not slow down this process.  Please return to the Emergency Department if you experience any worsening of your condition.   Thank you for allowing us  to be a part of your care.    Ozell HERO. Theadore, MD [facility] Emergency Medicine [facility] [email or web address]    Theadore Ozell HERO, MD 06/17/24 [phone]

## 2024-07-05 ENCOUNTER — Encounter: Payer: Self-pay | Admitting: Radiology

## 2024-08-20 ENCOUNTER — Ambulatory Visit: Admitting: Plastic Surgery

## 2024-08-20 ENCOUNTER — Encounter: Payer: Self-pay | Admitting: Plastic Surgery

## 2024-08-20 VITALS — BP 131/84 | HR 64 | Temp 97.9°F | Ht 64.0 in | Wt 179.0 lb

## 2024-08-20 DIAGNOSIS — M546 Pain in thoracic spine: Secondary | ICD-10-CM | POA: Diagnosis not present

## 2024-08-20 DIAGNOSIS — E66811 Obesity, class 1: Secondary | ICD-10-CM

## 2024-08-20 DIAGNOSIS — M793 Panniculitis, unspecified: Secondary | ICD-10-CM

## 2024-08-20 DIAGNOSIS — M542 Cervicalgia: Secondary | ICD-10-CM | POA: Diagnosis not present

## 2024-08-20 DIAGNOSIS — M549 Dorsalgia, unspecified: Secondary | ICD-10-CM | POA: Insufficient documentation

## 2024-08-20 DIAGNOSIS — R21 Rash and other nonspecific skin eruption: Secondary | ICD-10-CM | POA: Diagnosis not present

## 2024-08-20 DIAGNOSIS — G8929 Other chronic pain: Secondary | ICD-10-CM

## 2024-08-20 DIAGNOSIS — E65 Localized adiposity: Secondary | ICD-10-CM | POA: Diagnosis not present

## 2024-08-20 NOTE — Progress Notes (Signed)
" ° °  Subjective:    Patient ID: Denise Arias, female    DOB: 09/01/1981, 43 y.o.   MRN: [id number]  The patient is a 43 year old female here for further evaluation of her abdomen.  She is interested in a panniculectomy.  She complains of the folds under her skin with skin breakdown.  She has tried multiple different treatments none of which have worked.  She is 5 feet 4 inches tall and has been able to decrease her weight to 179 pounds.  She has done a great job with her weight loss.  She still complains of back and neck pain.  There is no sign of a hernia.      Review of Systems  Constitutional: Negative.   HENT: Negative.    Eyes: Negative.   Respiratory: Negative.    Cardiovascular: Negative.   Gastrointestinal: Negative.   Endocrine: Negative.   Genitourinary: Negative.   Musculoskeletal:  Positive for back pain and neck pain.  Skin:  Positive for rash.       Objective:   Physical Exam Vitals reviewed.  Constitutional:      Appearance: Normal appearance.  HENT:     Head: Atraumatic.  Cardiovascular:     Rate and Rhythm: Normal rate.     Pulses: Normal pulses.  Abdominal:     General: There is no distension.     Palpations: Abdomen is soft. There is no mass.     Tenderness: There is no abdominal tenderness.  Skin:    General: Skin is warm.     Capillary Refill: Capillary refill takes less than 2 seconds.  Neurological:     Mental Status: She is alert and oriented to person, place, and time.  Psychiatric:        Mood and Affect: Mood normal.        Behavior: Behavior normal.        Thought Content: Thought content normal.        Judgment: Judgment normal.        Assessment & Plan:     ICD-10-CM   1. 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 Abdominal obesity  E65     3. Obesity (BMI 30.0-34.9)  E66.811     4. Chronic bilateral thoracic back pain  M54.6    G89.29       Pictures were obtained of the patient and placed in the chart with the patient's or guardian's  permission.  The patient is a candidate for abdominoplasty add-on to panniculectomy with liposuction.  She understands that there may be complications with wound healing and that this will take time to heal if this does happen. "

## 2024-09-09 ENCOUNTER — Ambulatory Visit: Admitting: Internal Medicine

## 2024-09-20 ENCOUNTER — Other Ambulatory Visit: Payer: Self-pay | Admitting: Nurse Practitioner

## 2024-09-20 DIAGNOSIS — E66811 Obesity, class 1: Secondary | ICD-10-CM
